# Patient Record
Sex: Female | Born: 1940 | Race: White | Hispanic: No | Marital: Married | Smoking: Never smoker
Health system: Southern US, Community
[De-identification: ages and names within clinical notes are randomized; demographics above are authoritative.]

## PROBLEM LIST (undated history)

## (undated) DIAGNOSIS — N200 Calculus of kidney: Secondary | ICD-10-CM

## (undated) DIAGNOSIS — R82994 Hypercalciuria: Secondary | ICD-10-CM

## (undated) DIAGNOSIS — Z87898 Personal history of other specified conditions: Secondary | ICD-10-CM

## (undated) DIAGNOSIS — K635 Polyp of colon: Secondary | ICD-10-CM

## (undated) DIAGNOSIS — I341 Nonrheumatic mitral (valve) prolapse: Secondary | ICD-10-CM

## (undated) DIAGNOSIS — S329XXA Fracture of unspecified parts of lumbosacral spine and pelvis, initial encounter for closed fracture: Secondary | ICD-10-CM

## (undated) DIAGNOSIS — Z9181 History of falling: Secondary | ICD-10-CM

## (undated) DIAGNOSIS — Z85828 Personal history of other malignant neoplasm of skin: Secondary | ICD-10-CM

## (undated) DIAGNOSIS — M1990 Unspecified osteoarthritis, unspecified site: Secondary | ICD-10-CM

## (undated) HISTORY — DX: Polyp of colon: K63.5

## (undated) HISTORY — PX: CATARACT EXTRACTION: SUR2

## (undated) HISTORY — DX: Nonrheumatic mitral (valve) prolapse: I34.1

## (undated) HISTORY — DX: Calculus of kidney: N20.0

## (undated) HISTORY — DX: Personal history of other specified conditions: Z87.898

## (undated) HISTORY — DX: History of falling: Z91.81

## (undated) HISTORY — DX: Unspecified osteoarthritis, unspecified site: M19.90

## (undated) HISTORY — DX: Personal history of other malignant neoplasm of skin: Z85.828

## (undated) HISTORY — DX: Fracture of unspecified parts of lumbosacral spine and pelvis, initial encounter for closed fracture: S32.9XXA

## (undated) HISTORY — DX: Hypercalciuria: R82.994

---

## 1965-03-09 DIAGNOSIS — N200 Calculus of kidney: Secondary | ICD-10-CM

## 1965-03-09 HISTORY — PX: KIDNEY STONE SURGERY: SHX686

## 1965-03-09 HISTORY — DX: Calculus of kidney: N20.0

## 1997-03-09 HISTORY — PX: CHOLECYSTECTOMY: SHX55

## 1997-08-28 ENCOUNTER — Other Ambulatory Visit: Admission: RE | Admit: 1997-08-28 | Discharge: 1997-08-28 | Payer: Self-pay | Admitting: Obstetrics & Gynecology

## 1998-09-04 ENCOUNTER — Other Ambulatory Visit: Admission: RE | Admit: 1998-09-04 | Discharge: 1998-09-04 | Payer: Self-pay | Admitting: Obstetrics & Gynecology

## 1999-06-27 ENCOUNTER — Other Ambulatory Visit: Admission: RE | Admit: 1999-06-27 | Discharge: 1999-06-27 | Payer: Self-pay | Admitting: Obstetrics & Gynecology

## 2000-02-05 ENCOUNTER — Encounter: Admission: RE | Admit: 2000-02-05 | Discharge: 2000-02-05 | Payer: Self-pay | Admitting: Internal Medicine

## 2000-02-05 ENCOUNTER — Encounter: Payer: Self-pay | Admitting: Internal Medicine

## 2000-03-19 ENCOUNTER — Encounter: Admission: RE | Admit: 2000-03-19 | Discharge: 2000-03-19 | Payer: Self-pay | Admitting: Internal Medicine

## 2000-03-19 ENCOUNTER — Encounter: Payer: Self-pay | Admitting: Internal Medicine

## 2000-08-12 ENCOUNTER — Other Ambulatory Visit: Admission: RE | Admit: 2000-08-12 | Discharge: 2000-08-12 | Payer: Self-pay | Admitting: Obstetrics and Gynecology

## 2001-08-29 ENCOUNTER — Other Ambulatory Visit: Admission: RE | Admit: 2001-08-29 | Discharge: 2001-08-29 | Payer: Self-pay | Admitting: Obstetrics and Gynecology

## 2002-09-08 ENCOUNTER — Other Ambulatory Visit: Admission: RE | Admit: 2002-09-08 | Discharge: 2002-09-08 | Payer: Self-pay | Admitting: Obstetrics and Gynecology

## 2003-02-08 ENCOUNTER — Inpatient Hospital Stay (HOSPITAL_COMMUNITY): Admission: EM | Admit: 2003-02-08 | Discharge: 2003-02-11 | Payer: Self-pay | Admitting: Emergency Medicine

## 2003-02-09 ENCOUNTER — Encounter: Payer: Self-pay | Admitting: Cardiology

## 2003-02-12 ENCOUNTER — Ambulatory Visit (HOSPITAL_COMMUNITY): Admission: RE | Admit: 2003-02-12 | Discharge: 2003-02-12 | Payer: Self-pay | Admitting: Internal Medicine

## 2003-08-20 ENCOUNTER — Other Ambulatory Visit: Admission: RE | Admit: 2003-08-20 | Discharge: 2003-08-20 | Payer: Self-pay | Admitting: Obstetrics and Gynecology

## 2004-09-23 ENCOUNTER — Other Ambulatory Visit: Admission: RE | Admit: 2004-09-23 | Discharge: 2004-09-23 | Payer: Self-pay | Admitting: Obstetrics and Gynecology

## 2005-01-09 ENCOUNTER — Ambulatory Visit: Payer: Self-pay | Admitting: Internal Medicine

## 2005-01-21 ENCOUNTER — Ambulatory Visit: Payer: Self-pay | Admitting: Internal Medicine

## 2005-01-21 ENCOUNTER — Encounter (INDEPENDENT_AMBULATORY_CARE_PROVIDER_SITE_OTHER): Payer: Self-pay | Admitting: Specialist

## 2005-09-08 IMAGING — CT CT CERVICAL SPINE W/O CM
3 of 5 series · 9 of 20 positions shown, 11 images · non-contrast
Comparison: none

CLINICAL DATA: Low back and right chest pain following a fall. 
 TWO VIEW CHEST
 The cardiac silhouette is mildly enlarged.  The interstitial markings throughout both lungs are minimally prominent. Mild to moderate scoliosis is noted.  No fracture or pneumothorax is seen.
 IMPRESSION
 1.  Mild cardiomegaly. 
 2.  Minimal chronic interstitial lung disease.
 COMPLETE LUMBAR SPINE
 Five views demonstrate five non-rib bearing lumbar vertebrae.  Mild to moderate levoconvex lumbar scoliosis is noted as well as moderate right lateral disc space narrowing at the L4-5 level, minimal anterior spur formation at that level and mild anterior spur formation at the L3-4 level.  Facet degenerative changes are noted in the lower lumbar spine.  No fractures or subluxations are seen.  Cholecystectomy clips are noted.
 Scoliosis and degenerative changes as described above.  No fracture or subluxation.

[Series 4: helical c-spine · axial · 0.27mm/px · z∈[-244,-189]mm · 2 of 66 slices shown (1 of 2)]
[im 22/66  bone]
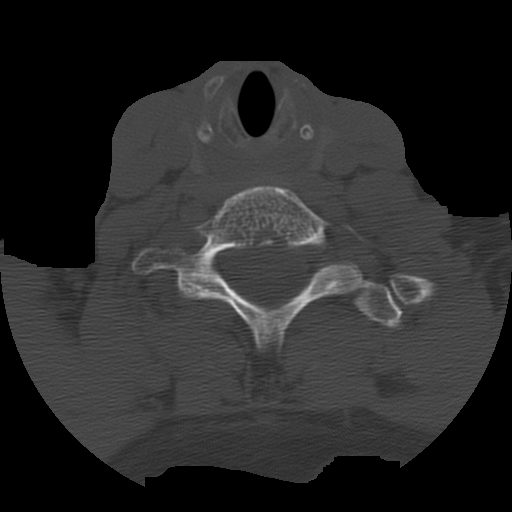
[im 44/66  bone]
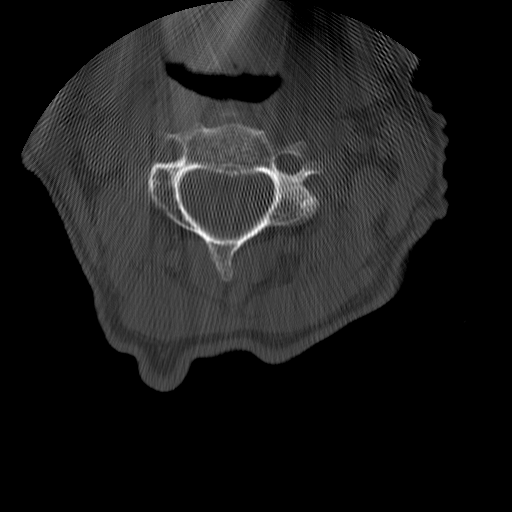

[Series 104: helical c-spine · axial · 0.27mm/px · z∈[-274,-157]mm · 6 of 131 slices shown, 8 images (2 of 2)]
[im 19/131  soft-tissue]
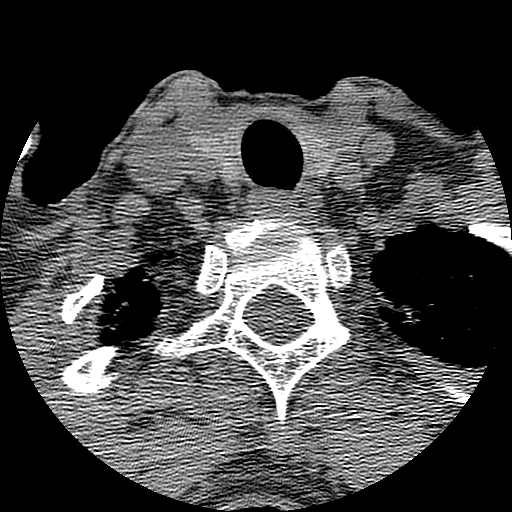
[im 19/131  bone]
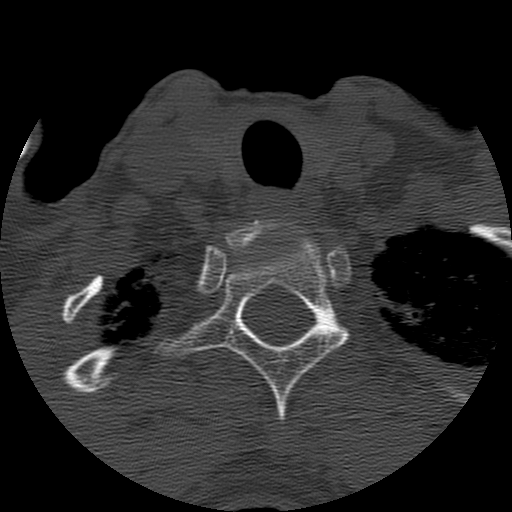
[im 38/131  bone]
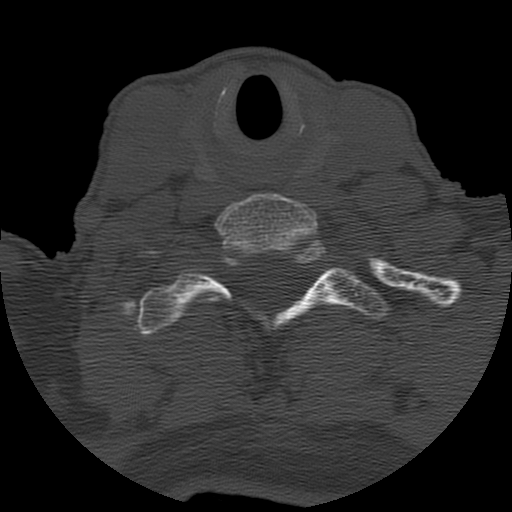
[im 56/131  bone]
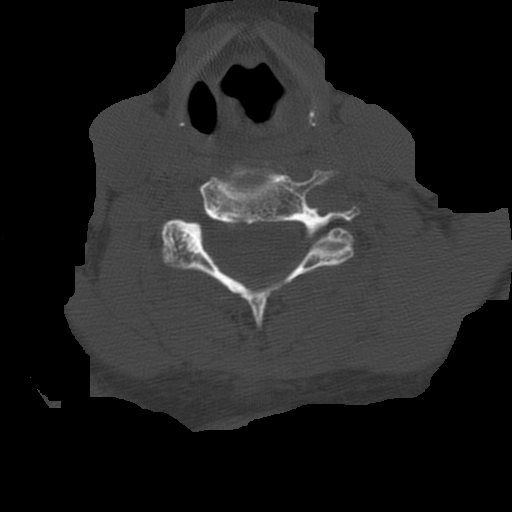
[im 75/131  bone]
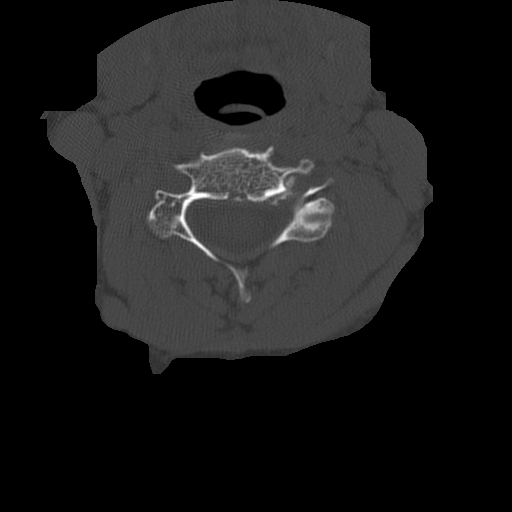
[im 93/131  soft-tissue]
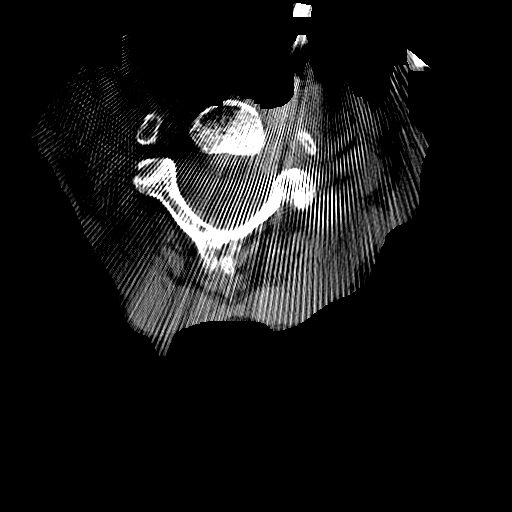
[im 93/131  bone]
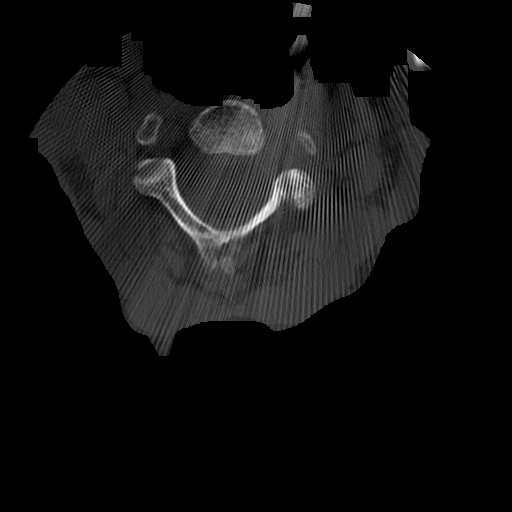
[im 112/131  bone]
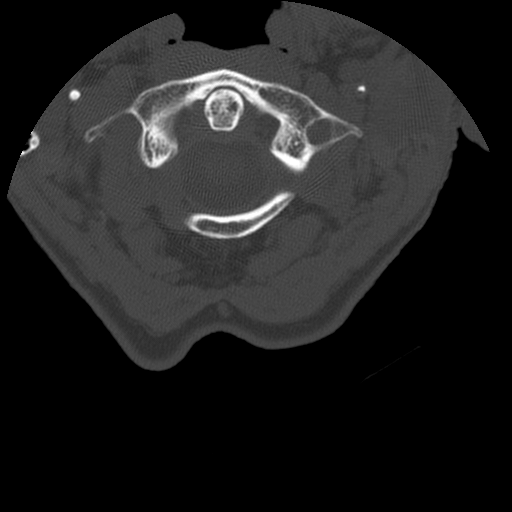

[Series 619: reformatted · coronal · 0.31mm/px · 1 of 40 slices shown]
[im 20/40  bone]
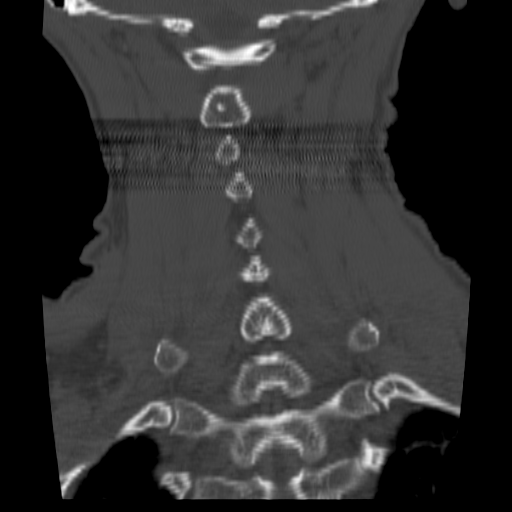

[9 of 20 positions shown; findings below may reference images not displayed]

## 2005-09-12 IMAGING — NM NM BONE 3 PHASE
4 series · 24 of 24 positions shown · non-contrast
Comparison: none

CLINICAL DATA: Syncope. Patient with a history of compression fracture. 
 THREE PHASE BONE SCAN  
 Following the IV administration of 25 mCi RRmMc MDP.  Regional imaging of the thoracic and lumbar regions performed. 
 In the region of the thoracic spine, there is no abnormal osseous tracer activity to suggest an acute compression fracture.  The patient?s recent radiographs of 02/09/03 do show mild wedging of T-8 which is remote.
 Activity posteriorly within the lumbar spine at the L4-5 and L5-S1 level is attributed to facet degenerative change.  There is a focus of activity within the left anterior 8th rib which I suspect reflects an occult fracture.
 IMPRESSION
 Negative for acute thoracic compression fracture. 
 Probable left T-8 rib fracture. 
 Degenerative changes lower lumbar spine.

[Series 1: bf bone flow-statics · 4.95mm/px · 6 of 24 frames shown (1 of 4)]
[frame 3/24]
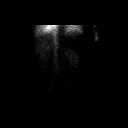
[frame 7/24]
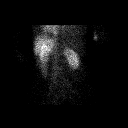
[frame 11/24]
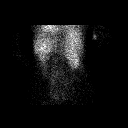
[frame 15/24]
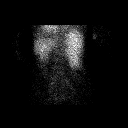
[frame 19/24]
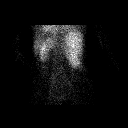
[frame 23/24]
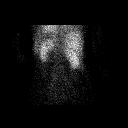

[Series 1: bf bone flow-statics · 5.00mm/px · 6 of 24 frames shown (2 of 4)]
[frame 3/24]
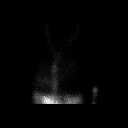
[frame 7/24]
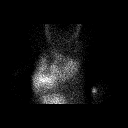
[frame 11/24]
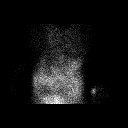
[frame 15/24]
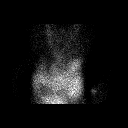
[frame 19/24]
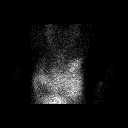
[frame 23/24]
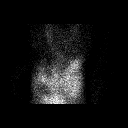

[Series 1: bf bone flow-statics · 4.95mm/px · 6 of 24 frames shown (3 of 4)]
[frame 3/24]
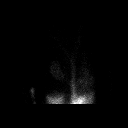
[frame 7/24]
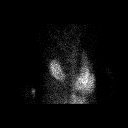
[frame 11/24]
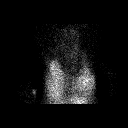
[frame 15/24]
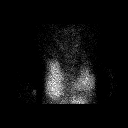
[frame 19/24]
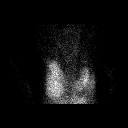
[frame 23/24]
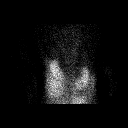

[Series 1: bf bone flow-statics · 5.00mm/px · 6 of 24 frames shown (4 of 4)]
[frame 3/24]
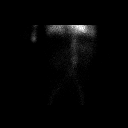
[frame 7/24]
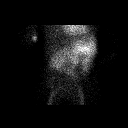
[frame 11/24]
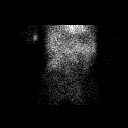
[frame 15/24]
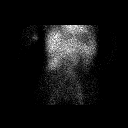
[frame 19/24]
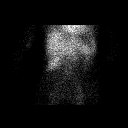
[frame 23/24]
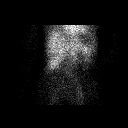

[24 of 24 positions shown; findings below may reference images not displayed]

## 2005-10-06 ENCOUNTER — Other Ambulatory Visit: Admission: RE | Admit: 2005-10-06 | Discharge: 2005-10-06 | Payer: Self-pay | Admitting: Obstetrics and Gynecology

## 2008-05-04 ENCOUNTER — Ambulatory Visit: Payer: Self-pay

## 2008-08-30 ENCOUNTER — Encounter: Admission: RE | Admit: 2008-08-30 | Discharge: 2008-10-04 | Payer: Self-pay | Admitting: Internal Medicine

## 2008-09-13 ENCOUNTER — Encounter: Admission: RE | Admit: 2008-09-13 | Discharge: 2008-09-13 | Payer: Self-pay | Admitting: Internal Medicine

## 2008-12-02 IMAGING — CT CT CHEST W/O CM
4 of 7 series · 17 of 36 positions shown, 19 images · non-contrast
Comparison: NONE

CLINICAL DATA: Cough, rib soreness. 

CT CHEST WITH INTRAVENOUS CONTRAST
TECHNIQUE: Multiple axial slices were obtained from the lung 
apex through the upper abdomen.  75 cc of Optiray 350 was injected 
at a rate of 3 cc per second.  Lung, soft tissue, and bone window 
settings were obtained.

[Series 2: with · axial · 0.68mm/px · z∈[+832,+1072]mm · 5 of 73 slices shown, 7 images]
[im 13/73  mediastinal]
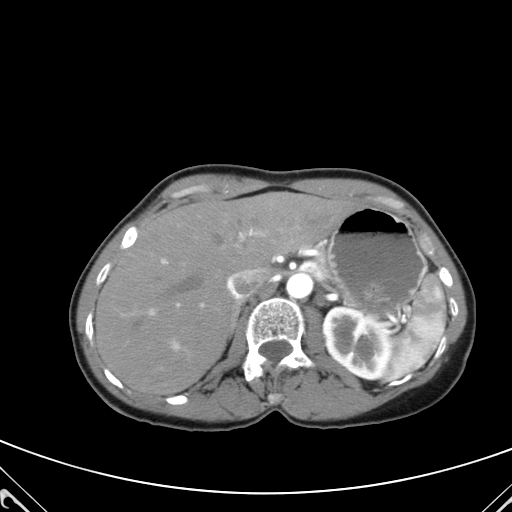
[im 13/73  lung]
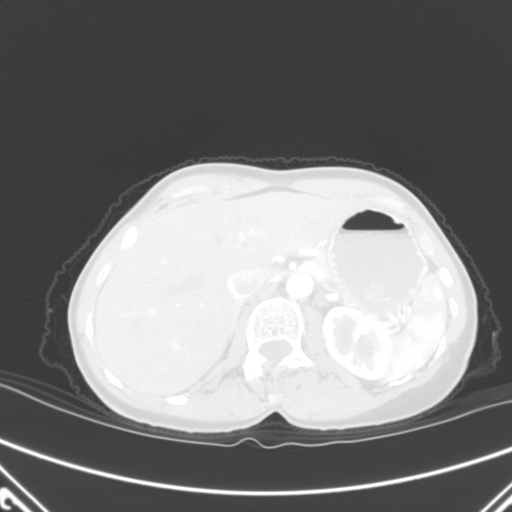
[im 25/73  lung]
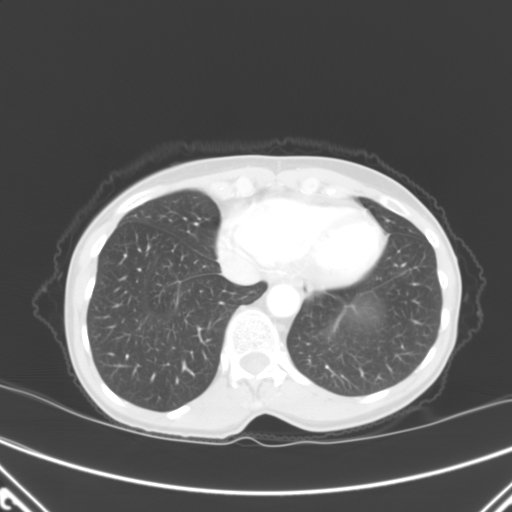
[im 37/73  lung]
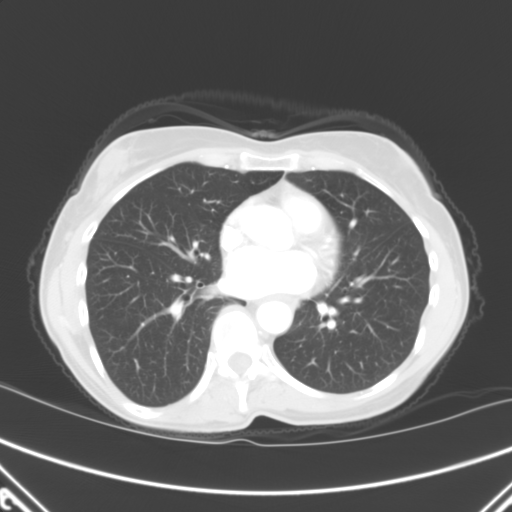
[im 49/73  lung]
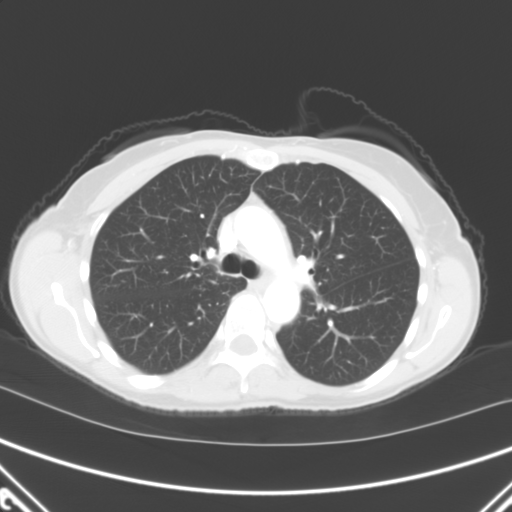
[im 61/73  mediastinal]
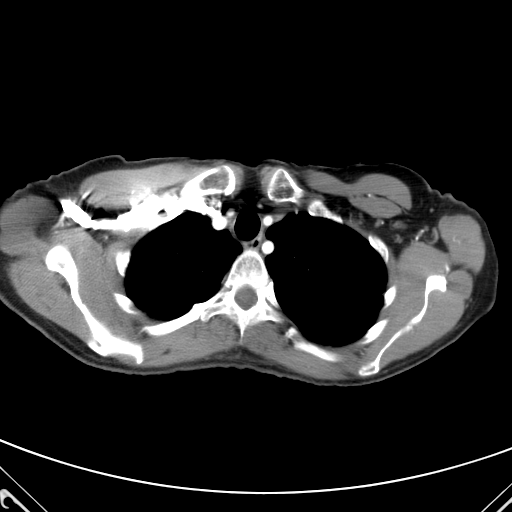
[im 61/73  lung]
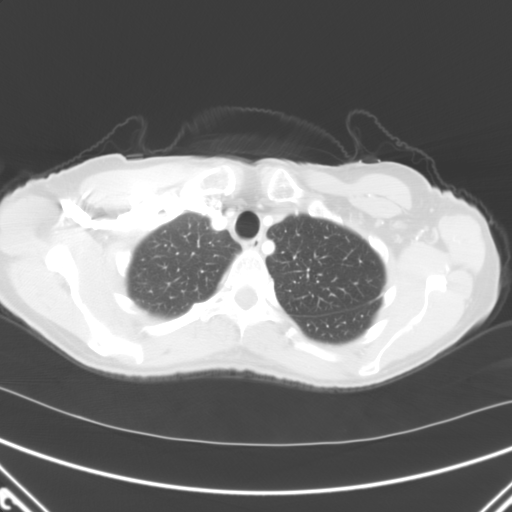

[Series 3: lung · axial · 0.68mm/px · z∈[+856,+1016]mm · 4 of 65 slices shown]
[im 11/65  lung]
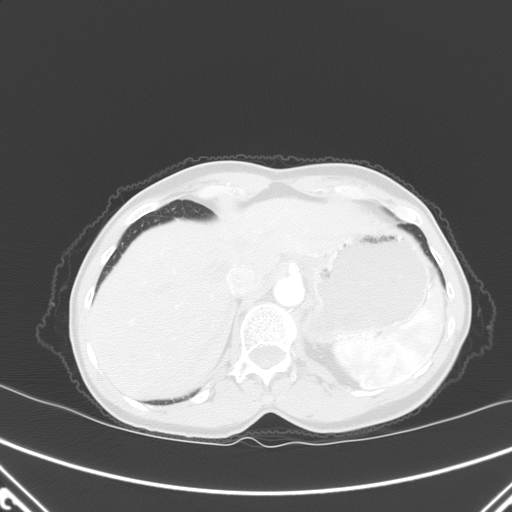
[im 22/65  lung]
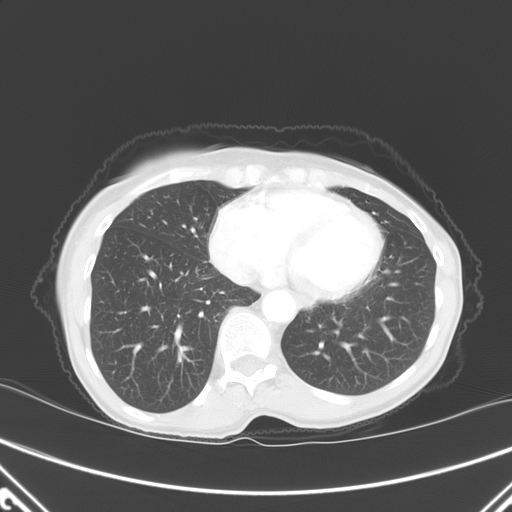
[im 33/65  lung]
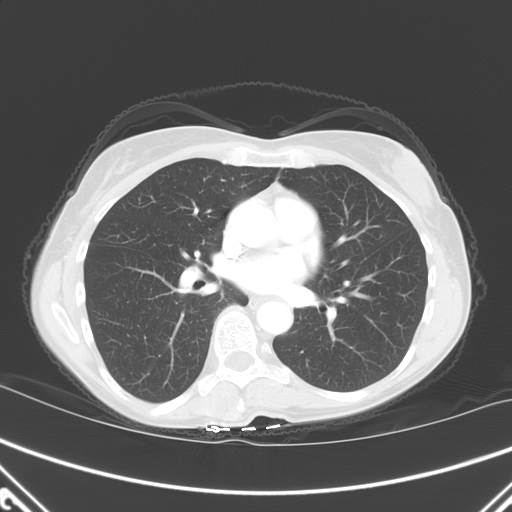
[im 43/65  lung]
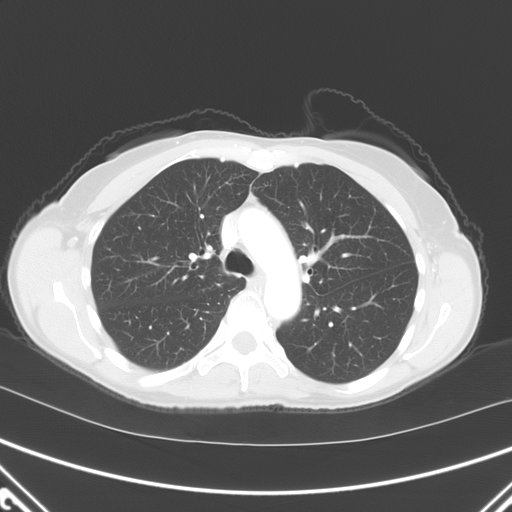

[Series 5: bone · axial · 0.68mm/px · z∈[+832,+1072]mm · 5 of 73 slices shown]
[im 13/73  lung]
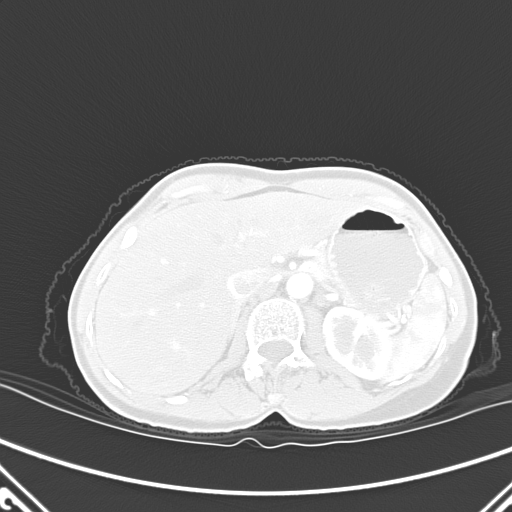
[im 25/73  lung]
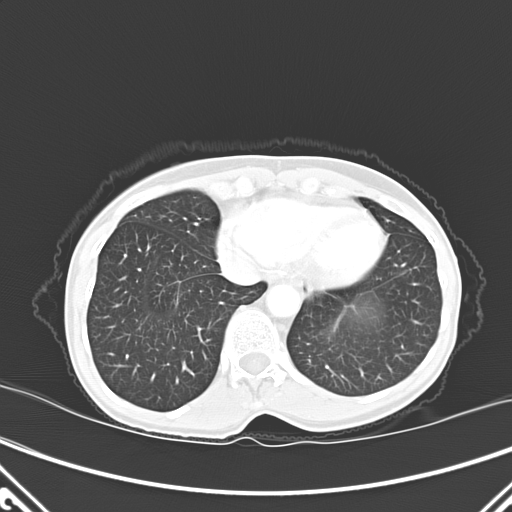
[im 37/73  lung]
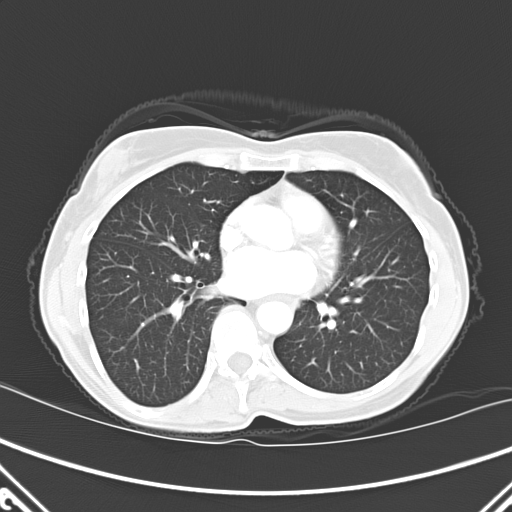
[im 49/73  lung]
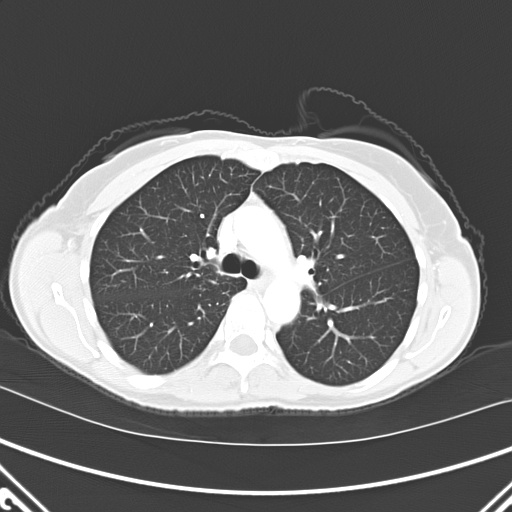
[im 61/73  lung]
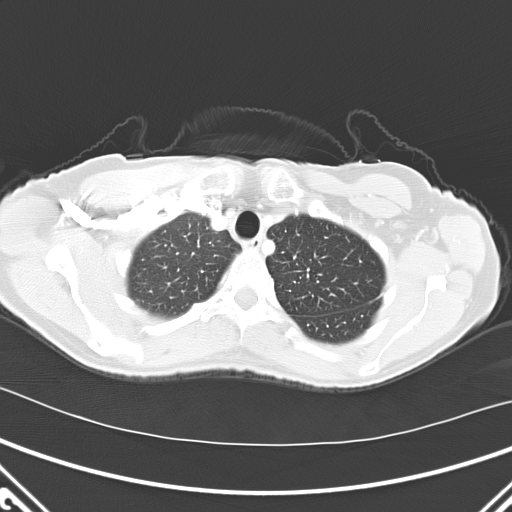

[coronals · coronal · 0.35mm/px · 3 of 33 slices shown]
[im 7/33  lung]
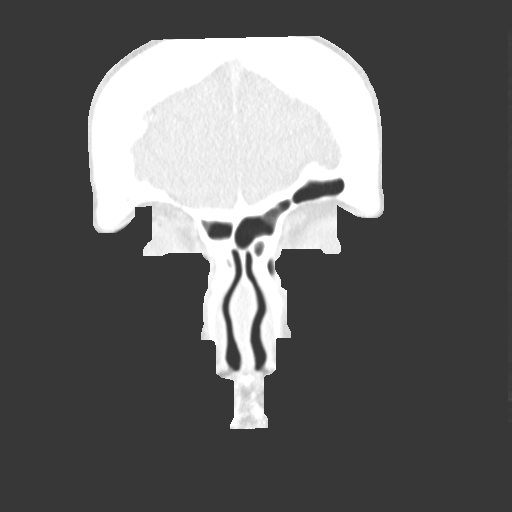
[im 13/33  lung]
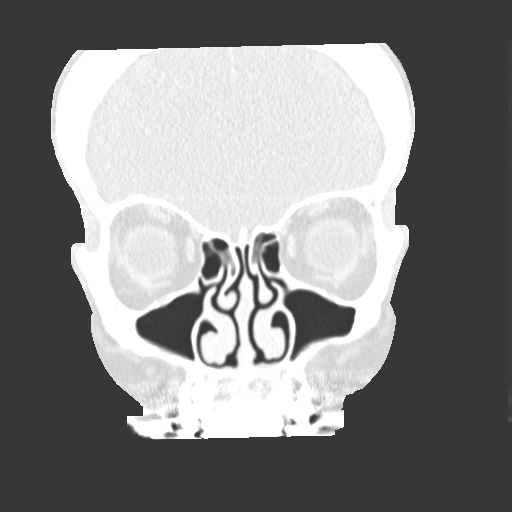
[im 20/33  lung]
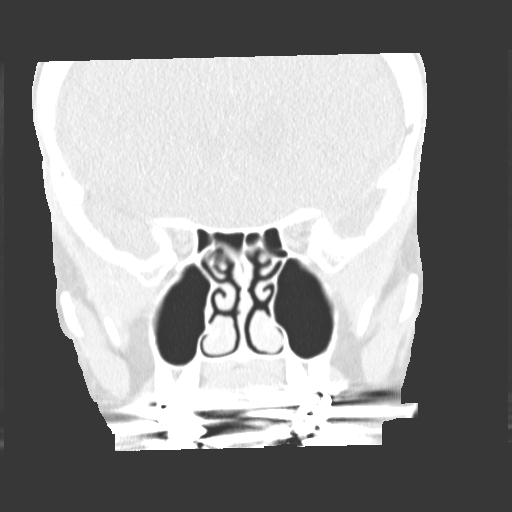

[17 of 36 positions shown; findings below may reference images not displayed]

FINDINGS: The heart size is upper limits of normal.  There is no 
evidence of mediastinal or hilar lymphadenopathy.  No pleural 
effusions or pneumothorax.  There are no pulmonary masses seen.  
No evidence of consolidation.  The adrenal glands are within 
normal limits.  There is diffuse osteopenia.  Patient has a mild 
dextroscoliosis of the thoracic spine.
IMPRESSION: Thoracic scoliosis. Osteopenia. Otherwise, chest CT 
electronically reviewed on 05/05/2006 Dict Date: 05/05/2006  Tran 
Date: 05/05/2006 DAS  JLM

## 2010-03-09 DIAGNOSIS — S329XXA Fracture of unspecified parts of lumbosacral spine and pelvis, initial encounter for closed fracture: Secondary | ICD-10-CM

## 2010-03-09 HISTORY — DX: Fracture of unspecified parts of lumbosacral spine and pelvis, initial encounter for closed fracture: S32.9XXA

## 2010-03-28 ENCOUNTER — Other Ambulatory Visit: Payer: Self-pay | Admitting: Dermatology

## 2010-06-23 ENCOUNTER — Ambulatory Visit (AMBULATORY_SURGERY_CENTER): Payer: Medicare Other | Admitting: *Deleted

## 2010-06-23 VITALS — Ht 64.0 in | Wt 115.6 lb

## 2010-06-23 DIAGNOSIS — Z860100 Personal history of colon polyps, unspecified: Secondary | ICD-10-CM

## 2010-06-23 MED ORDER — PEG-KCL-NACL-NASULF-NA ASC-C 100 G PO SOLR: 1.0000 | Freq: Once | ORAL | Status: AC

## 2010-07-07 ENCOUNTER — Encounter: Payer: Self-pay | Admitting: Internal Medicine

## 2010-07-07 ENCOUNTER — Ambulatory Visit (AMBULATORY_SURGERY_CENTER): Payer: Medicare Other | Admitting: Internal Medicine

## 2010-07-07 VITALS — BP 118/64 | HR 71 | Temp 97.4°F | Resp 16 | Ht 63.0 in | Wt 115.0 lb

## 2010-07-07 DIAGNOSIS — D126 Benign neoplasm of colon, unspecified: Secondary | ICD-10-CM

## 2010-07-07 DIAGNOSIS — Z1211 Encounter for screening for malignant neoplasm of colon: Secondary | ICD-10-CM

## 2010-07-07 DIAGNOSIS — Z86010 Personal history of colonic polyps: Secondary | ICD-10-CM

## 2010-07-07 DIAGNOSIS — K5730 Diverticulosis of large intestine without perforation or abscess without bleeding: Secondary | ICD-10-CM

## 2010-07-07 MED ORDER — SODIUM CHLORIDE 0.9 % IV SOLN: 500.0000 mL | INTRAVENOUS | Status: DC

## 2010-07-07 NOTE — Patient Instructions (Signed)
Follow discharge instructions.  Resume your medications.  Follow a High Fiber Diet,with liberal fluid intake.  Next Colonoscopy in 5 years.

## 2010-07-08 ENCOUNTER — Telehealth: Payer: Self-pay

## 2010-07-08 NOTE — Telephone Encounter (Signed)

## 2010-07-11 ENCOUNTER — Telehealth: Payer: Self-pay | Admitting: Internal Medicine

## 2010-07-11 NOTE — Telephone Encounter (Signed)
Explained to pt that there really should not be a reason for her neck to hurt. During colon the pt is placed on their side with a pillow under them. Suggested pt contact her primary care doctor if the discomfort persists. Pt verbalized understanding.

## 2010-07-11 NOTE — Telephone Encounter (Signed)
Returned call to number listed and received recording that the number was no longer a working number. Called the home number and left message for pt to call back.

## 2010-07-25 NOTE — Discharge Summary (Signed)
NAME:  Jacqueline Burgess, Jacqueline Burgess                       ACCOUNT NO.:  [id number]   MEDICAL RECORD NO.:  [id number]                   PATIENT TYPE:  INP   LOCATION:  3731                                 FACILITY:  [facility]   PHYSICIAN:  Hollice Espy, M.D.            DATE OF BIRTH:  November 22, 1940   DATE OF ADMISSION:  02/08/2003  DATE OF DISCHARGE:  02/11/2003                                 DISCHARGE SUMMARY   There is no dictation on this patient.                                                Hollice Espy, M.D.    SKK/MEDQ  D:  02/11/2003  T:  02/12/2003  Job:  [id number]

## 2010-07-25 NOTE — Discharge Summary (Signed)
NAME:  Jacqueline Burgess, Jacqueline Burgess                       ACCOUNT NO.:  [id number]   MEDICAL RECORD NO.:  [id number]                   PATIENT TYPE:  INP   LOCATION:  3731                                 FACILITY:  [facility]   PHYSICIAN:  Hollice Espy, M.D.            DATE OF BIRTH:  07/14/1940   DATE OF ADMISSION:  02/08/2003  DATE OF DISCHARGE:  02/11/2003                                 DISCHARGE SUMMARY   DISCHARGE DIAGNOSES:  1. Subacute T8 compression fracture.  2. Syncopal episode thought to be secondary to increased pain from #1.  3. Osteoporosis.  4. Gastroesophageal reflux disease.  5. Arthritis.  6. Anxiety.   DISCHARGE MEDICATIONS:  1. Celebrex 100 mg p.o. b.i.d.  2. Duragesic patch 25 mcg topically q.72h.  3. Hydrochlorothiazide 12.5 mg p.o. daily.   Continuation of all previous medications including:  1. Fosamax 70 every week.  2. Prempro 0.3/11.5 p.o. daily.  3. Her Aleve medication will be stopped.  4. Os-Cal D 500 mg p.o. t.i.d.  5. Clonazepam 0.25 mg p.o. q.h.s.  6. Amitriptyline 10 mg p.o. daily.   HISTORY OF PRESENT ILLNESS:  This is a 70 year old white female who  initially presented after having a few days of increased back pain, thought  to be secondary to recently stopping Vioxx as it discontinued, and not  feeling well who had a syncopal episode after she was walking to the  bathroom from her bed, and she had no recollection of any presyncopal  episode, was found by her husband immediately alert and oriented and brought  to the ED. CT of her head was negative. Cervical and lumbar films showed  notable multiple episodes of spinal arthritis but otherwise unremarkable.  The patient was on telemetry. She was not orthostatic. She was given IV  fluids. The patient was put on a Duragesic patch after she continued to have  severe episodes of pain and complaining of much pain in her lower back and  the thoracic area. Thoracic films were ordered. In addition, the  patient  responded well to the Duragesic patch. She had no evidence of any problems  while on telemetry, and her pain began to be well controlled once the  Duragesic patch came into effect. Carotid Doppler were initially ordered on  admission; however, due to scheduling delays, the patient was unable to get  this study done. The patient remained alert and oriented. She had no further  syncopal episode. She tolerated the patch well, and then her films came back  on February 10, 2003 noting that her thoracic film showed evidence of a T8  compression fracture. Given her history of severe increase in back pain a  few days prior, feel that this is a subacute fracture. Finally, given that  the carotid Dopplers would not be able to be done until February 12, 2003 as  well as getting a bone scan for followup, I discussed with the patient. The  patient currently states that her pain is much more well controlled. She is  ambulating well in the hallways. She denies any nausea. She denies any  syncopal episode. We both agree and felt comfortable saying that likely her  syncope was secondary to an acute compression fracture. I recommended that  she continue taking the Duragesic patch 25 mcg topically q.72h. I spoke with  orthopedic surgery, Dr. Rennis Chris, who after reviewing the patient's history  over the phone felt comfortable with seeing the patient in the outpatient  setting. He recommended that she follow up with [employer] [facility] and get  an extension brace, and this too could be done as an outpatient, allowing  for further mobility and better pain control. I spoke with the patient and  told her that the patient's 2-D echocardiogram which she had done further to  also assess on February 09, 2003 to assess her for syncope, it was completely  negative. She had no signs of any valvular disease, and she had a good  ejection fraction. I then spoke with patient further, explaining to her  about our  findings, and she told me she had actually been diagnosed with  osteoporosis few years ago, but on review of her medications, she is taking  as much medication as possible including Os-Cal D 500 t.i.d., Fosamax, and  is on hormone therapy. In addition, the patient told me that she had had a  workup done at [facility] a few years ago showing that she had a large  amount of calcium in her urine, and doctor had told her that she should be  on medicine for blood pressure which would lower this. This is likely a  thiazide diuretic, but she declined this at the time. 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 It is  noted that a calcium checked on her lab work during her hospital stay was  7.5. I feel that despite the number of medications she is on that perhaps  family genetics plus the combination of the hypercalciuria/hypocalcemia has  contributed to her osteoporosis and then therefore her compression fracture.  Therefore, I am starting a low dose hydrochlorothiazide 12.5 mg p.o. daily.  She can follow up with her PCP, Dr. Chilton Si, who can watch her potassium  levels and also ensure that she is tolerating this medicine well. In  addition, the patient was given Celebrex while she was here which she said  worked better than the Aleve, and tolerated this as well. I am writing a  prescription for this as well as her Duragesic patch. The patient is advised  to continue all of her previous medications. I did wonder about heavy  sedation from Duragesic and clonazepam which the latter she takes at night  and warned her about no driving until both of these medicines are tolerated  well for the next few days to a week. In addition, I told her to stop taking Aleve as she is going to be taking Celebrex. The  patient is otherwise  feeling better.   Disposition is quite improved. Her pain is well controlled. She is  discharged on a regular diet, and she is advised not to have any type of  heavy lifting and no exertional activity. I told her it was not idea to  return to work until she has been followed up by Dr. Chilton Si and by Dr. Rennis Chris  of the orthopedic doctor. She is advised to get carotid Dopplers and bone  scan, and prescriptions are written for this to get this done in the next  few days. Number for radiology scheduling has been given. In addition, the  patient will be given address for [employer] and the phone number  to get fitted for an extension brace, and prescription for this is given as  well. She will follow up with Dr. Chilton Si, PCP, in the next few days and Dr.  Rennis Chris of orthopedics later on this week following the brace. This patient  may indeed end up benefiting very well from a kyphoplasty. She is quite  ambulatory and leads an active healthy  lifestyle. This incident of fracture is unfortunate. I do feel comfortable  in saying that this is indeed the cause of her syncope as the rest of her  workup was negative, and I doubt Dopplers will give [place] much result. Even  with stenosis, TIA rarely ever presents as an episode of isolated syncope  with no neurological deficits.                                                Hollice Espy, M.D.    SKK/MEDQ  D:  02/11/2003  T:  02/12/2003  Job:  [id number]   cc:   Vania Rea. Rennis Chris, M.D.  [address]  [place]  [place] [address]  Fax: [phone]   Erskine Speed, M.D.  [address] [place]  [place]  [place] [address]  Fax: [phone]

## 2010-07-25 NOTE — H&P (Signed)
NAME:  Jacqueline Burgess, Jacqueline Burgess                       ACCOUNT NO.:  [id number]   MEDICAL RECORD NO.:  [id number]                   PATIENT TYPE:  INP   LOCATION:  3731                                 FACILITY:  [facility]   PHYSICIAN:  Hollice Espy, M.D.            DATE OF BIRTH:  [birth year]   DATE OF ADMISSION:  02/08/2003  DATE OF DISCHARGE:                                HISTORY & PHYSICAL   PRIMARY CARE PHYSICIAN:  Dr. Elmore Guise.   HISTORY OF PRESENT ILLNESS:  This is a 70 year old white female with past  medical history of GERD, osteoporosis, and anxiety who presents with a  syncopal episode. The patient states that she has severe arthritis and was  taking Vioxx, but recently the Vioxx was discontinued after FDA removed this  off the market. She has had an increased level of pain usually in her back,  especially in the last few days. She has had decreased activity and appetite  and has also complained of waking up with palpitations for the past few  days. On the morning of February 08, 2003, she woke up feeling nauseated and  walked to the bathroom. She had no vertigo or feeling of presyncope. The  next event she recalled was the sensation of falling, and she remembered  hitting her head. She was out for an unknown length of time, but she recalls  walking up and trying to call back into bed. She was found by husband in her  bed with a head laceration, and he also noted blood in the bathroom. He  tried to revive her. She awoke, and when he gave her water, she reportedly  passed out again. The patient reportedly had a previously episode 17 years  ago but none since. She was brought to the ED. She was complaining of left  back pain and right sided rib pain. Her CT of her C spine was negative. She  had no fractures, no spinal stenosis. The C4-5 film showed severe bilateral  insinuate and bilateral facet hypertrophy, right greater than left, and  __________ showed severe right foraminal  stenosis, moderate left stenosis.  Head CT was negative. Lumbar films showed DJD. The patient had no  neurological deficits. No orthostasis or head laceration was noted by the  emergency room attending physician. Currently, the patient states that she  is having a lot of pain in her back. She has a headache. She denies any  visual changes. She denies any chest pain or shortness of breath. She is  just tired and hurting all over. The patient also denies any hematuria,  dysuria, constipation, diarrhea, abdominal pain. She has some overall  weakness but denies any focal weakness.   PAST MEDICAL HISTORY:  1. Osteoporosis.  2. Arthritis.  3. GERD.   MEDICATIONS:  1. She is on Protonix 40.  2. Prempro 0.3/11.5 daily.  3. Clonazepam 0.5 mg one half tablet q.h.s.  4. Amitriptyline 10  mg p.o. q.h.s.  5. Fosamax every week.   ALLERGIES:  No known drug allergies although she says Sudafed gives her  palpitations.   SOCIAL HISTORY:  She denies any smoking or drinking.   FAMILY HISTORY:  Noncontributory.   PHYSICAL EXAMINATION:  VITAL SIGNS:  On admission were temperature 97.8,  heart rate 64, respirations 16, blood pressure 100/60, weight 120 pounds.  GENERAL:  She appears to be in moderate distress but otherwise alert and  oriented x3.  CARDIOVASCULAR:  Regular rate and rhythm, S1 and S2. She has no carotid  bruits.  HEENT:  She has a 2-cm head laceration which appears to be sewn and bandaged  over the right eye.  LUNGS:  Clear to auscultation bilaterally.  ABDOMEN:  Soft, nontender, nondistended, positive bowel sounds.  EXTREMITIES:  There is no clubbing, cyanosis, or edema although she has some  palpable tenderness over the right rib cage and left lower back, worsening  when I push heavily on it or when she moves excessively.   LABORATORY DATA:  Her PT is 13.6, INR 1.1. PH 7.43, pCO2 41.6, bicarb 28. UA  was negative. CT films and radiology films are as described in HPI. Troponin   I less 0.01. CPK 74, MB 1.1. Sodium 137, potassium 2.9, chloride 104, bicarb  22, glucose 115, creatinine 1.0. White count 10.7, H and H 13.2 and 39.8,  platelets 169.   ASSESSMENT/PLAN:  This is a 70 year old white female with a history of  arthritis who presents status post an episode of syncope. The cause may be  dehydration or from pain although the patient is definitely not orthostatic.  Reviewed films with neurosurgery but do not see any evidence of any spinal  stenosis. Question if this is an arrhythmia or may be vasovagal. Will check  carotid Dopplers, 2-D echocardiogram, IV fluids.  1. Status post fall.  2. Pain control. Her head laceration appears to be stable at this point.  3. Arthritis.                                                Hollice Espy, M.D.    SKK/MEDQ  D:  02/11/2003  T:  02/12/2003  Job:  [id number]   cc:   Erskine Speed, M.D.  [address], [address]  [place]  [place] [address]  Fax: [phone]

## 2010-10-15 ENCOUNTER — Ambulatory Visit
Admission: RE | Admit: 2010-10-15 | Discharge: 2010-10-15 | Disposition: A | Discharge status: Home / Self Care | Payer: Medicare Other | Source: Ambulatory Visit | Attending: Orthopedic Surgery | Admitting: Orthopedic Surgery

## 2010-10-15 ENCOUNTER — Other Ambulatory Visit: Payer: Self-pay | Admitting: Orthopedic Surgery

## 2010-10-15 DIAGNOSIS — T148XXA Other injury of unspecified body region, initial encounter: Secondary | ICD-10-CM

## 2010-11-08 HISTORY — PX: SHOULDER SURGERY: SHX246

## 2010-11-11 ENCOUNTER — Ambulatory Visit (HOSPITAL_COMMUNITY): Payer: Medicare Other

## 2010-11-11 ENCOUNTER — Inpatient Hospital Stay (HOSPITAL_COMMUNITY)
Admission: RE | Admit: 2010-11-11 | Discharge: 2010-11-13 | DRG: 494 | Disposition: A | Discharge status: Home Health | Payer: Medicare Other | Source: Ambulatory Visit | Attending: Orthopedic Surgery | Admitting: Orthopedic Surgery

## 2010-11-11 DIAGNOSIS — W19XXXA Unspecified fall, initial encounter: Secondary | ICD-10-CM | POA: Diagnosis present

## 2010-11-11 DIAGNOSIS — Z882 Allergy status to sulfonamides status: Secondary | ICD-10-CM

## 2010-11-11 DIAGNOSIS — Z888 Allergy status to other drugs, medicaments and biological substances status: Secondary | ICD-10-CM

## 2010-11-11 DIAGNOSIS — S42293A Other displaced fracture of upper end of unspecified humerus, initial encounter for closed fracture: Principal | ICD-10-CM | POA: Diagnosis present

## 2010-11-11 LAB — COMPREHENSIVE METABOLIC PANEL
AST: 29 U/L (ref 0–37)
BUN: 19 mg/dL (ref 6–23)
CO2: 27 mEq/L (ref 19–32)
Chloride: 106 mEq/L (ref 96–112)
Creatinine, Ser: 0.62 mg/dL (ref 0.50–1.10)
GFR calc Af Amer: >60 mL/min (ref >60)
GFR calc non Af Amer: >60 mL/min (ref >60)
Glucose, Bld: 94 mg/dL (ref 70–99)
Total Bilirubin: 0.4 mg/dL (ref 0.3–1.2)

## 2010-11-11 LAB — TYPE AND SCREEN
ABO/RH(D): O POS
Antibody Screen: NEGATIVE

## 2010-11-11 LAB — PROTIME-INR: INR: 0.98 (ref 0.00–1.49)

## 2010-11-11 LAB — CBC
HCT: 41 % (ref 36.0–46.0)
Hemoglobin: 13.9 g/dL (ref 12.0–15.0)
MCH: 31.1 pg (ref 26.0–34.0)
MCHC: 33.9 g/dL (ref 30.0–36.0)
MCV: 91.7 fL (ref 78.0–100.0)
RDW: 13.8 % (ref 11.5–15.5)

## 2010-11-11 LAB — SURGICAL PCR SCREEN: Staphylococcus aureus: NEGATIVE

## 2010-11-12 LAB — CBC
HCT: 26.5 % — ABNORMAL LOW (ref 36.0–46.0)
MCHC: 34 g/dL (ref 30.0–36.0)
MCV: 91.1 fL (ref 78.0–100.0)
RDW: 13.8 % (ref 11.5–15.5)

## 2010-11-12 LAB — BASIC METABOLIC PANEL
BUN: 20 mg/dL (ref 6–23)
Calcium: 7.8 mg/dL — ABNORMAL LOW (ref 8.4–10.5)
Chloride: 104 mEq/L (ref 96–112)
Creatinine, Ser: 0.49 mg/dL — ABNORMAL LOW (ref 0.50–1.10)
GFR calc Af Amer: >60 mL/min (ref >60)

## 2010-11-13 ENCOUNTER — Inpatient Hospital Stay (HOSPITAL_COMMUNITY): Payer: Medicare Other

## 2010-11-13 LAB — CBC
MCH: 31.6 pg (ref 26.0–34.0)
MCHC: 34.9 g/dL (ref 30.0–36.0)
Platelets: 118 10*3/uL — ABNORMAL LOW (ref 150–400)
RBC: 2.66 MIL/uL — ABNORMAL LOW (ref 3.87–5.11)

## 2010-11-13 LAB — BASIC METABOLIC PANEL
Calcium: 8.2 mg/dL — ABNORMAL LOW (ref 8.4–10.5)
Sodium: 137 mEq/L (ref 135–145)

## 2010-11-14 NOTE — Discharge Summary (Signed)
  [name], [name] NO.:  [id number]  MEDICAL RECORD NO.:  [id number]  LOCATION:  5001                         FACILITY:  [facility]  PHYSICIAN:  Eulas Post, MD    DATE OF BIRTH:  1940/03/20  DATE OF ADMISSION:  11/11/2010 DATE OF DISCHARGE:  11/13/2010                              DISCHARGE SUMMARY   ATTENDING SURGEON:  Eulas Post, MD  ADMISSION DIAGNOSIS:  Left proximal humerus fracture.  DISCHARGE DIAGNOSIS:  Left proximal humerus fracture.  PRIMARY PROCEDURE:  Left proximal humerus open reduction and internal fixation.  HOSPITAL COURSE:  Jacqueline Burgess is a 70 year old who broke her left shoulder.  Initially, we tried to treat this nonoperatively, but this subsequently failed.  She elected for surgical management because of the significant displacement.  She tolerated the procedure well and postoperatively did not have any complications.  She was given perioperative antibiotics for antimicrobial prophylaxis.  She was given sequential compression devices and early ambulation for DVT prophylaxis. She was on p.o. analgesics and had satisfactory pain control.  She was discharged home with followup with me in 2 weeks.  There were no complications, and she benefited maximally from her hospital stay.  Her sensation was intact throughout her, arm and she was neurovascularly intact distally.     Eulas Post, MD     JPL/MEDQ  D:  11/13/2010  T:  11/13/2010  Job:  [id number]  Electronically Signed by Teryl Lucy MD on 11/14/2010 04:33:01 PM

## 2010-11-14 NOTE — Op Note (Signed)
Jacqueline Burgess, Jacqueline Burgess NO.:  [id number]  MEDICAL RECORD NO.:  [id number]  LOCATION:  5001                         FACILITY:  [facility]  PHYSICIAN:  Eulas Post, MD    DATE OF BIRTH:  [birth year]  DATE OF PROCEDURE:  11/11/2010 DATE OF DISCHARGE:                              OPERATIVE REPORT   ATTENDING SURGEON:  Eulas Post, MD  FIRST ASSISTANT:  Janace Litten, orthopedic PA-C  PREOPERATIVE DIAGNOSIS:  Left proximal humerus fracture.  POSTOPERATIVE DIAGNOSIS:  Left proximal humerus fracture.  OPERATIVE PROCEDURE:  Open reduction and internal fixation of left proximal humerus fracture.  ANESTHESIA:  General with regional block.  ESTIMATED BLOOD LOSS:  100 mL.  OPERATIVE IMPLANTS:  Biomet S3 proximal humerus locking plate.  PREOPERATIVE INDICATIONS:  Mrs. Jacqueline Burgess is a 70 year old woman who had a left proximal humerus fracture.  Initially, we attempted to treat this nonoperatively, but she subsequently displaced and the shaft came out nearly completely from underneath the humeral head, and she also went into a varus position.  Therefore, she elected for surgical management.  The risks, benefits, and alternatives were discussed before the procedure including but not limited to risks of infection, bleeding, nerve injury, malunion, nonunion, hardware prominence, hardware failure, need for hardware removal, stiffness, loss of function, need for conversion to hemiarthroplasty, cardiopulmonary complications, among others.  She was willing to proceed.  OPERATIVE PROCEDURE:  The patient was brought to the operating room and placed in supine position.  IV antibiotics were given.  Regional block had already been given.  General anesthesia was administered.  She was placed in the beach-chair position.  Foley was also placed.  The left upper extremity was prepped and draped in the usual sterile fashion. Time-out was performed.  Deltopectoral interval  was carried out. Cephalic vein was retracted laterally.  The fracture site was identified.  It had already fairly healed, which made this much more difficult.  I took the fracture callus down, and the shaft of the anterior, and mobilize things posteriorly and used the C-arm to guide my circumferential release in order to gain mobility of the head.  Once I had the head mobile, I also placed a suture in the subscapularis and in the supraspinatus in order to help control the head segment.  I then replaced the head onto the shaft in the appropriate position and I applied a plate.  This was secured initially with a screw in the sliding hole and then I confirmed my reduction and held the head in place with 2 K-wires through the plate.  I had restored anatomic alignment both on AP and lateral views and restored the valgus inclination of the head.  I then secured the plate anteroinferiorly first with a smooth peg, and then one posteriorly and then completed the fixation through the proximal holes using smooth pegs in a locking configuration.  I then placed a second screw in the shaft. These had excellent fixation.  I did not feel that I needed a third screw.  Her bone quality was poor up in the head but was reasonable down in the shaft.  My 2 inferior screws in the head were little bit short,  and therefore I replaced these to be longer in order to make sure that I got some fixation in the head through the distal screws.  This was fairly challenging overall getting the correct length because the head fragment itself was somewhat thin, but nonetheless I did have satisfactory purchase and the entire head moved as a unit with the shaft.  I then irrigated the wounds copiously and repaired the deltopectoral interval with Vicryl and Vicryl for subcutaneous tissue with 4-0 Monocryl with Steri-Strips and sterile gauze for the skin. Janace Litten, orthopedic PA-C, was present and scrubbed throughout  the case and critical for completion with assistance with exposure as well as instrumentation and closure.  There were no complications and she tolerated the procedure well.  She will be in a sling for 6 weeks.     Eulas Post, MD     JPL/MEDQ  D:  11/11/2010  T:  11/12/2010  Job:  [id number]  Electronically Signed by Teryl Lucy MD on 11/14/2010 04:33:00 PM

## 2011-01-01 ENCOUNTER — Other Ambulatory Visit: Payer: Self-pay | Admitting: Dermatology

## 2011-03-16 DIAGNOSIS — Z4789 Encounter for other orthopedic aftercare: Secondary | ICD-10-CM | POA: Diagnosis not present

## 2011-03-16 DIAGNOSIS — M25519 Pain in unspecified shoulder: Secondary | ICD-10-CM | POA: Diagnosis not present

## 2011-03-16 DIAGNOSIS — S42213A Unspecified displaced fracture of surgical neck of unspecified humerus, initial encounter for closed fracture: Secondary | ICD-10-CM | POA: Diagnosis not present

## 2011-03-19 DIAGNOSIS — M25519 Pain in unspecified shoulder: Secondary | ICD-10-CM | POA: Diagnosis not present

## 2011-03-19 DIAGNOSIS — S42213A Unspecified displaced fracture of surgical neck of unspecified humerus, initial encounter for closed fracture: Secondary | ICD-10-CM | POA: Diagnosis not present

## 2011-03-25 DIAGNOSIS — S42213A Unspecified displaced fracture of surgical neck of unspecified humerus, initial encounter for closed fracture: Secondary | ICD-10-CM | POA: Diagnosis not present

## 2011-03-31 DIAGNOSIS — M25519 Pain in unspecified shoulder: Secondary | ICD-10-CM | POA: Diagnosis not present

## 2011-03-31 DIAGNOSIS — S42213A Unspecified displaced fracture of surgical neck of unspecified humerus, initial encounter for closed fracture: Secondary | ICD-10-CM | POA: Diagnosis not present

## 2011-04-02 DIAGNOSIS — S42213A Unspecified displaced fracture of surgical neck of unspecified humerus, initial encounter for closed fracture: Secondary | ICD-10-CM | POA: Diagnosis not present

## 2011-04-02 DIAGNOSIS — M25519 Pain in unspecified shoulder: Secondary | ICD-10-CM | POA: Diagnosis not present

## 2011-04-22 ENCOUNTER — Other Ambulatory Visit: Payer: Self-pay | Admitting: Dermatology

## 2011-04-22 DIAGNOSIS — C44621 Squamous cell carcinoma of skin of unspecified upper limb, including shoulder: Secondary | ICD-10-CM | POA: Diagnosis not present

## 2011-04-22 DIAGNOSIS — C44529 Squamous cell carcinoma of skin of other part of trunk: Secondary | ICD-10-CM | POA: Diagnosis not present

## 2011-04-22 DIAGNOSIS — L821 Other seborrheic keratosis: Secondary | ICD-10-CM | POA: Diagnosis not present

## 2011-05-06 DIAGNOSIS — M25519 Pain in unspecified shoulder: Secondary | ICD-10-CM | POA: Diagnosis not present

## 2011-06-22 DIAGNOSIS — D126 Benign neoplasm of colon, unspecified: Secondary | ICD-10-CM | POA: Diagnosis not present

## 2011-06-22 DIAGNOSIS — Z0000 Encounter for general adult medical examination without abnormal findings: Secondary | ICD-10-CM | POA: Diagnosis not present

## 2011-06-22 DIAGNOSIS — M810 Age-related osteoporosis without current pathological fracture: Secondary | ICD-10-CM | POA: Diagnosis not present

## 2011-06-22 DIAGNOSIS — Z79899 Other long term (current) drug therapy: Secondary | ICD-10-CM | POA: Diagnosis not present

## 2011-06-26 DIAGNOSIS — Z1231 Encounter for screening mammogram for malignant neoplasm of breast: Secondary | ICD-10-CM | POA: Diagnosis not present

## 2011-06-26 LAB — HM MAMMOGRAPHY

## 2011-08-26 ENCOUNTER — Other Ambulatory Visit: Payer: Self-pay | Admitting: Dermatology

## 2011-08-26 DIAGNOSIS — D0460 Carcinoma in situ of skin of unspecified upper limb, including shoulder: Secondary | ICD-10-CM | POA: Diagnosis not present

## 2011-08-26 DIAGNOSIS — Z85828 Personal history of other malignant neoplasm of skin: Secondary | ICD-10-CM | POA: Diagnosis not present

## 2011-08-26 DIAGNOSIS — C44621 Squamous cell carcinoma of skin of unspecified upper limb, including shoulder: Secondary | ICD-10-CM | POA: Diagnosis not present

## 2011-09-11 DIAGNOSIS — C44621 Squamous cell carcinoma of skin of unspecified upper limb, including shoulder: Secondary | ICD-10-CM | POA: Diagnosis not present

## 2011-10-06 DIAGNOSIS — N952 Postmenopausal atrophic vaginitis: Secondary | ICD-10-CM | POA: Diagnosis not present

## 2011-10-21 DIAGNOSIS — Z961 Presence of intraocular lens: Secondary | ICD-10-CM | POA: Diagnosis not present

## 2011-10-21 DIAGNOSIS — H10409 Unspecified chronic conjunctivitis, unspecified eye: Secondary | ICD-10-CM | POA: Diagnosis not present

## 2011-10-21 DIAGNOSIS — H2510 Age-related nuclear cataract, unspecified eye: Secondary | ICD-10-CM | POA: Diagnosis not present

## 2011-10-21 DIAGNOSIS — H04129 Dry eye syndrome of unspecified lacrimal gland: Secondary | ICD-10-CM | POA: Diagnosis not present

## 2011-11-05 DIAGNOSIS — M48061 Spinal stenosis, lumbar region without neurogenic claudication: Secondary | ICD-10-CM | POA: Diagnosis not present

## 2011-11-05 DIAGNOSIS — M79609 Pain in unspecified limb: Secondary | ICD-10-CM | POA: Diagnosis not present

## 2011-11-30 DIAGNOSIS — H2510 Age-related nuclear cataract, unspecified eye: Secondary | ICD-10-CM | POA: Diagnosis not present

## 2011-12-07 DIAGNOSIS — H2510 Age-related nuclear cataract, unspecified eye: Secondary | ICD-10-CM | POA: Diagnosis not present

## 2011-12-07 DIAGNOSIS — H25049 Posterior subcapsular polar age-related cataract, unspecified eye: Secondary | ICD-10-CM | POA: Diagnosis not present

## 2011-12-17 DIAGNOSIS — Z23 Encounter for immunization: Secondary | ICD-10-CM | POA: Diagnosis not present

## 2012-01-13 DIAGNOSIS — L738 Other specified follicular disorders: Secondary | ICD-10-CM | POA: Diagnosis not present

## 2012-01-13 DIAGNOSIS — L821 Other seborrheic keratosis: Secondary | ICD-10-CM | POA: Diagnosis not present

## 2012-01-13 DIAGNOSIS — L723 Sebaceous cyst: Secondary | ICD-10-CM | POA: Diagnosis not present

## 2012-01-13 DIAGNOSIS — Z85828 Personal history of other malignant neoplasm of skin: Secondary | ICD-10-CM | POA: Diagnosis not present

## 2012-01-13 DIAGNOSIS — L570 Actinic keratosis: Secondary | ICD-10-CM | POA: Diagnosis not present

## 2012-01-21 ENCOUNTER — Encounter: Payer: Self-pay | Admitting: Internal Medicine

## 2012-02-03 ENCOUNTER — Ambulatory Visit (INDEPENDENT_AMBULATORY_CARE_PROVIDER_SITE_OTHER): Payer: Medicare Other | Admitting: Internal Medicine

## 2012-02-03 ENCOUNTER — Encounter: Payer: Self-pay | Admitting: Internal Medicine

## 2012-02-03 VITALS — BP 106/66 | HR 97 | Temp 98.0°F | Ht 63.75 in | Wt 117.0 lb

## 2012-02-03 DIAGNOSIS — M1990 Unspecified osteoarthritis, unspecified site: Secondary | ICD-10-CM

## 2012-02-03 DIAGNOSIS — Z85828 Personal history of other malignant neoplasm of skin: Secondary | ICD-10-CM

## 2012-02-03 DIAGNOSIS — N200 Calculus of kidney: Secondary | ICD-10-CM | POA: Insufficient documentation

## 2012-02-03 DIAGNOSIS — D126 Benign neoplasm of colon, unspecified: Secondary | ICD-10-CM | POA: Diagnosis not present

## 2012-02-03 DIAGNOSIS — M129 Arthropathy, unspecified: Secondary | ICD-10-CM | POA: Diagnosis not present

## 2012-02-03 DIAGNOSIS — G4700 Insomnia, unspecified: Secondary | ICD-10-CM

## 2012-02-03 DIAGNOSIS — K635 Polyp of colon: Secondary | ICD-10-CM | POA: Insufficient documentation

## 2012-02-03 DIAGNOSIS — M810 Age-related osteoporosis without current pathological fracture: Secondary | ICD-10-CM

## 2012-02-03 DIAGNOSIS — Z9181 History of falling: Secondary | ICD-10-CM

## 2012-02-03 DIAGNOSIS — R109 Unspecified abdominal pain: Secondary | ICD-10-CM

## 2012-02-03 DIAGNOSIS — Z87442 Personal history of urinary calculi: Secondary | ICD-10-CM

## 2012-02-03 NOTE — Patient Instructions (Signed)
Trial of  prilosec 20 mg   For 2 weeks  And then as needed  and limit ibuprofen   Can use Tylenol  For now for pain   .  If Gi issues continue then we can do more evaluation.  Weight bearing exercise is good for osteoporosis . And adequate calcium and vitamin D.  Get last 2 years of records and any immmunization data  From dr Chilton Si.   Get a lab appt   Before   Next visit in about a month.

## 2012-02-03 NOTE — Progress Notes (Signed)
Chief Complaint  Patient presents with  . Establish Care    HPI: Patient comes in today for new patient visit. Her previous care has been through Dr.Ed  Chilton Si  but she is changing providers because of various reasons and the fact that her daughter and family are in our practice. Her ongoing problems include sleep difficulties for which she is taking amitriptyline 10 mg and clonazepam 0.5 mg a half for sleep at night for a number of years. She's had osteoporotic fracture when she had an undecided fall a year ago in [place] when getting up from the table after having some coffee and broke her pelvis. And her shoulder She has a history of taking Fosamax for a couple years the remote past but stopped it because of concerns about side effects none known where her last DEXA scan was done. She tries to exercise do weightbearing calcium vitamin D. She also has a history of skin cancers sees Dr. Venancio Poisson has had a basal cell and squamous cell. She has a remote history of kidney stones 1967 and colonic polyps. She takes occasional methocarbamol half of a 500 mg at night if she is having a hard time sleeping because of musculoskeletal pain.Methocarbamol   Used after fracture  And  Uses prn   Muscle spasn taking 1/2  About 3 x per week Left knee hurts lateral when kneeing at church and no other time   For a month . No swelling dysfunction. Exercises:  Walking  Has had a funny feeling in her stomach over the last few months. Cut down on her ibuprofen and only taking it in the morning I  Decreased from  2 because having growling feeling. For about 2 months. Discomfort is Worse on empty stomach.  Last colon dr Marina Goodell.  Up-to-date on colonoscopy no history of ulcer or bleeding vomiting change in bowel habits. ROS: See pertinent positives and negatives per HPI. 12 system reviewNegative currently for chest pain shortness of breath major changes in vision and hearing palpitations racing heart syncope although she cannot  explain why she fell a year ago remote history of another fall years ago. Uses premarin vaginal cream   Past Medical History  Diagnosis Date  . Arthritis   . Cataract   . Osteoporosis   . Kidney stones 1967  . Colon polyps   . Closed pelvic fracture 2012    pelvis and shoulder   . Hx of fall     fracture pelvis and left shoulder humerus  . Hx of nonmelanoma skin cancer     Family History  Problem Relation Age of Onset  . CVA Mother     Died in her 28s complications rheumatic  fever afterwards when she got a stroke  . Lung cancer Sister     Died in 20s was a smoker  . Rheumatic fever Mother   . Thyroid cancer Daughter     Papillary    History   Social History  . Marital Status: Married    Spouse Name: N/A    Number of Children: N/A  . Years of Education: N/A   Social History Main Topics  . Smoking status: Never Smoker   . Smokeless tobacco: None  . Alcohol Use: 1.5 oz/week    3 drink(s) per week  . Drug Use: No  . Sexually Active: None   Other Topics Concern  . 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 AMITRIPTYLINE HCL PO Take 10 mg by mouth Nightly.       . Calcium Carbonate-Vitamin D (CALCIUM-VITAMIN D) 500-200 MG-UNIT per tablet Take 1 tablet by mouth 2 (two) times daily with a meal.        . CLONAZEPAM PO Take 0.5 mg by mouth Nightly.       . cycloSPORINE (RESTASIS) 0.05 % ophthalmic emulsion Place 1 drop into both eyes 2 (two) times daily.      . fish oil-omega-3 fatty acids 1000 MG capsule Take by mouth daily.        Marland Kitchen glucosamine-chondroitin 500-400 MG tablet Take 1 tablet by mouth daily.        . methocarbamol (ROBAXIN) 500 MG tablet Take 500 mg by mouth. Using  about 3 times weekly.      . Multiple Vitamins-Minerals (MULTIVITAL) tablet Take 1 tablet by mouth daily.        Marland Kitchen VITAMIN D, ERGOCALCIFEROL, PO Take by mouth.        . [DISCONTINUED] Meloxicam (MOBIC PO) Take by mouth daily.         Facility-Administered Encounter Medications as of 02/03/2012  Medication Dose Route Frequency Provider Last Rate Last Dose  . 0.9 %  sodium chloride infusion  500 mL Intravenous Continuous Hilarie Fredrickson, MD        EXAM:  BP 106/66  Pulse 97  Temp 98 F (36.7 C) (Oral)  Ht 5' 3.75" (1.619 m)  Wt 117 lb (53.071 kg)  BMI 20.24 kg/m2  SpO2 98%  Body mass index is 20.24 kg/(m^2).  GENERAL: vitals reviewed and listed above, alert, oriented, appears well hydrated and in no acute distress  HEENT: [place] atraumatic, conjunctiva  Clear, eoms nl  no obvious abnormalities on inspection of external nose and ears  tms intact OP : no lesion edema or exudate    NECK: no obvious masses on inspection palpation  No bruit   Supple   LUNGS: clear to auscultation bilaterally, no wheezes, rales or rhonchi, good air movement  CV: HRRR, no clubbing cyanosis or  peripheral edema nl cap refill  Abdomen:  Sof,t normal bowel sounds without hepatosplenomegaly, no guarding rebound or masses no CVA tenderness MS: moves all extremities without noticeable focal  Abnormality left knee no swelling good rom Skin: sun changes no bruising or bleeding  PSYCH: pleasant and cooperative, no obvious depression or anxiety Neuro oriented x 3 cn seems intact 3-12 nl gait  Appears non focal   No motor changes dtrs intact  Oriented x 3 and no noted deficits in memory, attention, and speech.  ASSESSMENT AND PLAN:  Discussed the following assessment and plan:  1. Abdominal discomfort  cycloSPORINE (RESTASIS) 0.05 % ophthalmic emulsion, methocarbamol (ROBAXIN) 500 MG tablet, Basic metabolic panel, CBC with Differential, Hepatic function panel, Lipid panel, TSH, Vitamin D 25 hydroxy, PTH, intact and  calcium   epigastric pre prandial concern could be frim her nsaid ; dec and trial prilosec then fu  labs etc   2. Arthritis  cycloSPORINE (RESTASIS) 0.05 % ophthalmic emulsion, methocarbamol (ROBAXIN) 500 MG tablet, Basic metabolic panel, CBC with Differential, Hepatic function panel, Lipid panel, TSH, Vitamin D 25 hydroxy, PTH, intact and calcium  3. Colon polyps  cycloSPORINE (RESTASIS)  0.05 % ophthalmic emulsion, methocarbamol (ROBAXIN) 500 MG tablet, Basic metabolic panel, CBC with Differential, Hepatic function panel, Lipid panel, TSH, Vitamin D 25 hydroxy, PTH, intact and calcium  4. Insomnia  cycloSPORINE (RESTASIS) 0.05 % ophthalmic emulsion, methocarbamol (ROBAXIN) 500 MG tablet, Basic metabolic panel, CBC with Differential, Hepatic function panel, Lipid panel, TSH, Vitamin D 25 hydroxy, PTH, intact and calcium   on meds for years risk benefot discussed.   5. Hx of fall  cycloSPORINE (RESTASIS) 0.05 % ophthalmic emulsion, methocarbamol (ROBAXIN) 500 MG tablet, Basic metabolic panel, CBC with Differential, Hepatic function panel, Lipid panel, TSH, Vitamin D 25 hydroxy, PTH, intact and calcium  6. Hx of nonmelanoma skin cancer  cycloSPORINE (RESTASIS) 0.05 % ophthalmic emulsion, methocarbamol (ROBAXIN) 500 MG tablet, Basic metabolic panel, CBC with Differential, Hepatic function panel, Lipid panel, TSH, Vitamin D 25 hydroxy, PTH, intact and calcium  7. Osteoporosis  cycloSPORINE (RESTASIS) 0.05 % ophthalmic emulsion, methocarbamol (ROBAXIN) 500 MG tablet, Basic metabolic panel, CBC with Differential, Hepatic function panel, Lipid panel, TSH, Vitamin D 25 hydroxy, PTH, intact and calcium  8. History of renal stone  cycloSPORINE (RESTASIS) 0.05 % ophthalmic emulsion, methocarbamol (ROBAXIN) 500 MG tablet, Basic metabolic panel, CBC with Differential, Hepatic function panel, Lipid panel, TSH, Vitamin D 25 hydroxy, PTH, intact and calcium   -Patient advised to return or notify health care team   immediately if symptoms worsen or persist or new concerns arise.  Patient Instructions  Trial of  prilosec 20 mg   For 2 weeks  And then as needed  and limit ibuprofen   Can use Tylenol  For now for pain   .  If Gi issues continue then we can do more evaluation.  Weight bearing exercise is good for osteoporosis . And adequate calcium and vitamin D.  Get last 2 years of records and any immmunization data  From dr Chilton Si.   Get a lab appt   Before   Next visit in about a month.     Neta Mends. Panosh M.D.  Patient Care Team: Madelin Headings, MD as PCP - General (Internal Medicine) Oliver Pila, MD as Attending Physician (Obstetrics and Gynecology) Eulas Post, MD as Attending Physician (Orthopedic Surgery) Dorothyann Gibbs, MD (Ophthalmology) Noralee Stain, MD as Attending Physician (Dermatology) Hilarie Fredrickson, MD as Attending Physician (Gastroenterology)

## 2012-02-06 ENCOUNTER — Encounter: Payer: Self-pay | Admitting: Internal Medicine

## 2012-02-06 DIAGNOSIS — Z87442 Personal history of urinary calculi: Secondary | ICD-10-CM | POA: Insufficient documentation

## 2012-02-06 DIAGNOSIS — Z9181 History of falling: Secondary | ICD-10-CM | POA: Insufficient documentation

## 2012-02-06 DIAGNOSIS — M810 Age-related osteoporosis without current pathological fracture: Secondary | ICD-10-CM | POA: Insufficient documentation

## 2012-02-06 DIAGNOSIS — R109 Unspecified abdominal pain: Secondary | ICD-10-CM | POA: Insufficient documentation

## 2012-02-06 DIAGNOSIS — Z85828 Personal history of other malignant neoplasm of skin: Secondary | ICD-10-CM | POA: Insufficient documentation

## 2012-02-26 ENCOUNTER — Other Ambulatory Visit (INDEPENDENT_AMBULATORY_CARE_PROVIDER_SITE_OTHER): Payer: Medicare Other

## 2012-02-26 DIAGNOSIS — K635 Polyp of colon: Secondary | ICD-10-CM

## 2012-02-26 DIAGNOSIS — Z87442 Personal history of urinary calculi: Secondary | ICD-10-CM | POA: Diagnosis not present

## 2012-02-26 DIAGNOSIS — R109 Unspecified abdominal pain: Secondary | ICD-10-CM

## 2012-02-26 DIAGNOSIS — D126 Benign neoplasm of colon, unspecified: Secondary | ICD-10-CM

## 2012-02-26 DIAGNOSIS — M1990 Unspecified osteoarthritis, unspecified site: Secondary | ICD-10-CM

## 2012-02-26 DIAGNOSIS — Z9181 History of falling: Secondary | ICD-10-CM | POA: Diagnosis not present

## 2012-02-26 DIAGNOSIS — M810 Age-related osteoporosis without current pathological fracture: Secondary | ICD-10-CM | POA: Diagnosis not present

## 2012-02-26 DIAGNOSIS — M129 Arthropathy, unspecified: Secondary | ICD-10-CM | POA: Diagnosis not present

## 2012-02-26 DIAGNOSIS — G4700 Insomnia, unspecified: Secondary | ICD-10-CM | POA: Diagnosis not present

## 2012-02-26 DIAGNOSIS — Z85828 Personal history of other malignant neoplasm of skin: Secondary | ICD-10-CM | POA: Diagnosis not present

## 2012-02-26 LAB — LIPID PANEL: VLDL: 18.8 mg/dL (ref 0.0–40.0)

## 2012-02-26 LAB — CBC WITH DIFFERENTIAL/PLATELET
Basophils Absolute: 0 10*3/uL (ref 0.0–0.1)
Basophils Relative: 0.6 % (ref 0.0–3.0)
Hemoglobin: 14.5 g/dL (ref 12.0–15.0)
Lymphocytes Relative: 29.6 % (ref 12.0–46.0)
Monocytes Relative: 8.6 % (ref 3.0–12.0)
Neutro Abs: 3.2 10*3/uL (ref 1.4–7.7)
RBC: 4.64 Mil/uL (ref 3.87–5.11)
RDW: 14 % (ref 11.5–14.6)

## 2012-02-26 LAB — BASIC METABOLIC PANEL
Calcium: 9.1 mg/dL (ref 8.4–10.5)
GFR: 66.01 mL/min (ref >60.00)
Sodium: 137 mEq/L (ref 135–145)

## 2012-02-26 LAB — HEPATIC FUNCTION PANEL
AST: 24 U/L (ref 0–37)
Albumin: 4.3 g/dL (ref 3.5–5.2)
Alkaline Phosphatase: 62 U/L (ref 39–117)

## 2012-02-26 LAB — TSH: TSH: 0.89 u[IU]/mL (ref 0.35–5.50)

## 2012-02-27 LAB — VITAMIN D 25 HYDROXY (VIT D DEFICIENCY, FRACTURES): Vit D, 25-Hydroxy: 77 ng/mL (ref 30–89)

## 2012-02-29 LAB — PTH, INTACT AND CALCIUM: PTH: 28.7 pg/mL (ref 14.0–72.0)

## 2012-03-14 ENCOUNTER — Telehealth: Payer: Self-pay | Admitting: Internal Medicine

## 2012-03-14 ENCOUNTER — Ambulatory Visit (INDEPENDENT_AMBULATORY_CARE_PROVIDER_SITE_OTHER): Payer: Medicare Other | Admitting: Internal Medicine

## 2012-03-14 ENCOUNTER — Encounter: Payer: Self-pay | Admitting: Internal Medicine

## 2012-03-14 VITALS — BP 122/78 | HR 86 | Temp 97.7°F | Wt 119.0 lb

## 2012-03-14 DIAGNOSIS — M810 Age-related osteoporosis without current pathological fracture: Secondary | ICD-10-CM | POA: Diagnosis not present

## 2012-03-14 DIAGNOSIS — E785 Hyperlipidemia, unspecified: Secondary | ICD-10-CM | POA: Diagnosis not present

## 2012-03-14 DIAGNOSIS — Z9189 Other specified personal risk factors, not elsewhere classified: Secondary | ICD-10-CM

## 2012-03-14 DIAGNOSIS — Z87898 Personal history of other specified conditions: Secondary | ICD-10-CM

## 2012-03-14 DIAGNOSIS — R109 Unspecified abdominal pain: Secondary | ICD-10-CM | POA: Diagnosis not present

## 2012-03-14 DIAGNOSIS — R350 Frequency of micturition: Secondary | ICD-10-CM | POA: Insufficient documentation

## 2012-03-14 LAB — POCT URINALYSIS DIPSTICK
Blood, UA: NEGATIVE
Ketones, UA: NEGATIVE
Leukocytes, UA: NEGATIVE
Protein, UA: NEGATIVE
Spec Grav, UA: 1.015
pH, UA: 5.5

## 2012-03-14 NOTE — Progress Notes (Signed)
Chief Complaint  Patient presents with  . Follow-up    Has had urinary frequency for 3-4 days.  . Urinary Frequency    HPI:  Patient comes in today for follow up of  multiple medical problems.  GI  Sx better AFTER 2 weeks    meds and helped the sx and  Now off  .   For at least 3 weeks.   Now has a cough  Recently  No fever   Wheezing.  No sob  urinary frequency for about 3-4 days without hematuria  Flank pain  True dysuria.  ROS: See pertinent positives and negatives per HPI.  Past Medical History  Diagnosis Date  . Arthritis   . Cataract     lens implant  . Osteoporosis     eval dr Sharl Ma in past dexa 11 12   . Kidney stones 1967  . Colon polyps   . Closed pelvic fracture 2012    pelvis and shoulder   . Hx of fall     fracture pelvis and left shoulder humerus  . Hx of nonmelanoma skin cancer     ssca  . MVP (mitral valve prolapse)     in records  no echo eval in records  . Hx of syncope     had neg event monitor per dr Tammy Sours taylor in 2010  . 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 CVA Mother     Died in her 20s complications rheumatic  fever afterwards when she got a stroke  . Lung cancer Sister     Died in 41s was a smoker  . Rheumatic fever Mother   . Thyroid cancer Daughter     Papillary    History   Social History  . Marital Status: Married    Spouse Name: N/A    Number of Children: N/A  . Years of Education: N/A   Social History Main Topics  . Smoking status: Never Smoker   . Smokeless tobacco: None  . Alcohol Use: 1.5 oz/week    3 drink(s) per week  . Drug Use: No  . Sexually Active: None   Other Topics Concern  . 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 AMITRIPTYLINE HCL PO Take 10 mg by mouth Nightly.       . Calcium Carbonate-Vitamin D (CALCIUM-VITAMIN D) 500-200 MG-UNIT per tablet Take 1 tablet by mouth 2 (two) times daily with a meal.        . CLONAZEPAM PO Take 0.5 mg by mouth Nightly.       . cycloSPORINE (RESTASIS) 0.05 % ophthalmic emulsion Place 1 drop into both eyes 2 (two) times daily.      Marland Kitchen glucosamine-chondroitin 500-400 MG tablet Take 1 tablet by mouth daily.        . methocarbamol (ROBAXIN) 500 MG tablet Take 500 mg by mouth. Using about 3 times weekly.      . Multiple Vitamins-Minerals (MULTIVITAL) tablet Take 1 tablet by mouth daily.        Marland Kitchen VITAMIN D, ERGOCALCIFEROL, PO Take by mouth.        . [  DISCONTINUED] fish oil-omega-3 fatty acids 1000 MG capsule Take by mouth daily.         Facility-Administered Encounter Medications as of 03/14/2012  Medication Dose Route Frequency Provider Last Rate Last Dose  . 0.9 %  sodium chloride infusion  500 mL Intravenous Continuous Hilarie Fredrickson, MD        EXAM:  BP 122/78  Pulse 86  Temp 97.7 F (36.5 C) (Oral)  Wt 119 lb (53.978 kg)  SpO2 98%  There is no height on file to calculate BMI.  GENERAL: vitals reviewed and listed above, alert, oriented, appears well hydrated and in no acute distress  HEENT: atraumatic, conjunctiva  clear, no obvious abnormalities on inspection of external nose and ears OP : no lesion edema or exudate   NECK: no obvious masses on inspection palpation   LUNGS: clear to auscultation bilaterally, no wheezes, rales or rhonchi, good air movement  CV: HRRR, no clubbing cyanosis or  peripheral edema nl cap refill   MS: moves all extremities without noticeable focal  abnormality  PSYCH: pleasant and cooperative, no obvious depression or anxiety Lab Results  Component Value Date   WBC 5.6 02/26/2012   HGB 14.5 02/26/2012   HCT 42.8 02/26/2012   PLT 162.0 02/26/2012   GLUCOSE 96  02/26/2012   CHOL 218* 02/26/2012   TRIG 94.0 02/26/2012   HDL 63.20 02/26/2012   LDLDIRECT 146.4 02/26/2012   ALT 19 02/26/2012   AST 24 02/26/2012   NA 137 02/26/2012   K 4.4 02/26/2012   CL 102 02/26/2012   CREATININE 0.9 02/26/2012   BUN 25* 02/26/2012   CO2 28 02/26/2012   TSH 0.89 02/26/2012   INR 0.98 11/11/2010    ASSESSMENT AND PLAN:  Discussed the following assessment and plan:  1. Urinary frequency  POC Urinalysis Dipstick  2. Abdominal discomfort     resolved  on short course  prilosec   3. Osteoporosis     pth vit d ok hx of fosamax  4. Other and unspecified hyperlipidemia     ldl 140 range   ratio 3   5. Hx of syncope      -Patient advised to return or notify health care team  immediately if symptoms worsen or persist or new concerns arise.  Patient Instructions    If  Your stomach  Is better as it seems  You may try small aspirin 81 mg per day or 3 x per week   May recduce rsik of heart atttack and strok.e.  Continue weight bearing exercise . Consider prolia   In future for osteoporosis  Contact us if want to begin thi.   Should have repeat DEXA scan in November  2014 ( 2 year follow up and then make further recommendations.  Will review the records and let you knoe if other   Follow up needed.     Neta Mends. Tangee Marszalek M.D.  Total visit > 50% spent counseling and coordinating care  Record review from Dr Chilton Si and update EHR  as best possible  .

## 2012-03-14 NOTE — Patient Instructions (Signed)
   If  Your stomach  Is better as it seems  You may try small aspirin 81 mg per day or 3 x per week   May recduce rsik of heart atttack and strok.e.  Continue weight bearing exercise . Consider prolia   In future for osteoporosis  Contact us if want to begin thi.   Should have repeat DEXA scan in November  2014 ( 2 year follow up and then make further recommendations.  Will review the records and let you knoe if other   Follow up needed.

## 2012-03-14 NOTE — Telephone Encounter (Signed)
Pt's AVS states she needs a bone scan in Nov. 2014. She said she had her last one at [facility]. Please put in order for it to be done at [facility], and pt will call to sched.

## 2012-03-15 ENCOUNTER — Other Ambulatory Visit: Payer: Self-pay | Admitting: Family Medicine

## 2012-03-15 NOTE — Telephone Encounter (Signed)
Pt notified and given phone number.

## 2012-03-15 NOTE — Telephone Encounter (Signed)
We are unable to order this test through our system for [facility].  She will need to call and make appt @ [phone].  They will then fax over the appropriate paperwork.  Left a message on home phone for the pt to call back.

## 2012-04-02 ENCOUNTER — Encounter: Payer: Self-pay | Admitting: Internal Medicine

## 2012-04-02 DIAGNOSIS — Z87898 Personal history of other specified conditions: Secondary | ICD-10-CM | POA: Insufficient documentation

## 2012-04-02 DIAGNOSIS — E785 Hyperlipidemia, unspecified: Secondary | ICD-10-CM | POA: Insufficient documentation

## 2012-04-11 ENCOUNTER — Telehealth: Payer: Self-pay | Admitting: Internal Medicine

## 2012-04-11 NOTE — Telephone Encounter (Signed)
Pt needs new script in 15 days for CLONAZEPAM PO.  Pt has automatic refill under her old MD and does not want  meds  under his name again. She is a new pt w/ Dr Fabian Sharp.  She will need AMITRIPTYLINE HCL PO in 2 1/2 mos.

## 2012-04-11 NOTE — Telephone Encounter (Signed)
Ok to give #30      0.5 mg at night as needed . Refill x 3

## 2012-04-12 ENCOUNTER — Other Ambulatory Visit: Payer: Self-pay | Admitting: Family Medicine

## 2012-04-12 MED ORDER — CLONAZEPAM 0.5 MG PO TABS: 0.5000 mg | ORAL_TABLET | Freq: Every evening | ORAL | Status: DC | PRN

## 2012-04-12 NOTE — Telephone Encounter (Signed)
Called to the pharmacy and left on voicemail. 

## 2012-06-28 ENCOUNTER — Ambulatory Visit (INDEPENDENT_AMBULATORY_CARE_PROVIDER_SITE_OTHER): Payer: Medicare Other | Admitting: Sports Medicine

## 2012-06-28 VITALS — BP 114/60 | Ht 63.0 in | Wt 115.0 lb

## 2012-06-28 DIAGNOSIS — M25579 Pain in unspecified ankle and joints of unspecified foot: Secondary | ICD-10-CM

## 2012-06-28 DIAGNOSIS — M4120 Other idiopathic scoliosis, site unspecified: Secondary | ICD-10-CM | POA: Insufficient documentation

## 2012-06-28 NOTE — Progress Notes (Signed)
  Subjective:    Patient ID: Jacqueline Burgess, female    DOB: 31-May-1940, 72 y.o.   MRN: [id number]  HPI  Pt presents to clinic for orthotics evaluation. She has been wearing a 3/4 length foam orthotic for many years. Not currently having any foot pain. She was having forefoot pain before she started wearing orthotics years ago.   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, medical  Review of Systems     Objective:   Physical Exam  No acute distress  Interphalangeal joint subluxation bilat great toes  Rt foot 4th MT down Great toe lacks flexion Early hammering of toes 4-5  Lt foot: collapse of 3rd MT head Morton's callus Deviation of PIP joint of 1st toe Bunionette on 5th  Slight hammering of 4th Slight hallux rigidus of great toe  Walking gait: Little push off of great toe bilat Paradoxical upward motion of great toes with push off -- right primarily Rt side swings less than rt  Leg lengths equal SI joints move freely FABER tight bilat  Mild scoliosis Rt side elevation with forward bend 15-18 deg mid thoracic curve     Assessment & Plan:

## 2012-06-28 NOTE — Assessment & Plan Note (Signed)
I suggested keeping some exercises for posture and back rotation up so this does not worsen particularly since she has a history of osteoporosis

## 2012-06-28 NOTE — Assessment & Plan Note (Signed)
She was placed in a sports insole with metatarsal pads  Walking gait was comfortable with this and it seemed to control her forefoot pain  We will need to add metatarsal pads to other shoes  She may be able to get by without a custom orthotic if this helps

## 2012-06-28 NOTE — Patient Instructions (Addendum)
Try green insoles in your shoes  Work on keeping your upper spine straight by doing lateral rotations at the waist from side to side and arms overhead  Standing hip rotations  Please follow up in 1 month if needed  Thank you for seeing [place] today!

## 2012-07-12 ENCOUNTER — Ambulatory Visit: Payer: Medicare Other | Admitting: Internal Medicine

## 2012-07-13 DIAGNOSIS — Z85828 Personal history of other malignant neoplasm of skin: Secondary | ICD-10-CM | POA: Diagnosis not present

## 2012-07-13 DIAGNOSIS — L821 Other seborrheic keratosis: Secondary | ICD-10-CM | POA: Diagnosis not present

## 2012-07-13 DIAGNOSIS — D1801 Hemangioma of skin and subcutaneous tissue: Secondary | ICD-10-CM | POA: Diagnosis not present

## 2012-07-13 DIAGNOSIS — L570 Actinic keratosis: Secondary | ICD-10-CM | POA: Diagnosis not present

## 2012-07-14 ENCOUNTER — Ambulatory Visit: Payer: Medicare Other | Admitting: Internal Medicine

## 2012-09-12 ENCOUNTER — Ambulatory Visit (INDEPENDENT_AMBULATORY_CARE_PROVIDER_SITE_OTHER): Payer: Medicare Other | Admitting: Internal Medicine

## 2012-09-12 ENCOUNTER — Encounter: Payer: Self-pay | Admitting: Internal Medicine

## 2012-09-12 VITALS — BP 110/70 | HR 82 | Temp 97.9°F | Wt 117.0 lb

## 2012-09-12 DIAGNOSIS — R350 Frequency of micturition: Secondary | ICD-10-CM

## 2012-09-12 DIAGNOSIS — M810 Age-related osteoporosis without current pathological fracture: Secondary | ICD-10-CM

## 2012-09-12 DIAGNOSIS — K625 Hemorrhage of anus and rectum: Secondary | ICD-10-CM

## 2012-09-12 DIAGNOSIS — N952 Postmenopausal atrophic vaginitis: Secondary | ICD-10-CM

## 2012-09-12 DIAGNOSIS — G4700 Insomnia, unspecified: Secondary | ICD-10-CM

## 2012-09-12 LAB — POCT URINALYSIS DIPSTICK
Glucose, UA: NEGATIVE
Ketones, UA: NEGATIVE
Spec Grav, UA: 1.025

## 2012-09-12 MED ORDER — CLONAZEPAM 0.5 MG PO TABS: 0.5000 mg | ORAL_TABLET | Freq: Every evening | ORAL | Status: DC | PRN

## 2012-09-12 MED ORDER — ESTROGENS, CONJUGATED 0.625 MG/GM VA CREA: TOPICAL_CREAM | VAGINAL | Status: DC

## 2012-09-12 NOTE — Progress Notes (Signed)
Chief Complaint  Patient presents with  . Follow-up    Would like to know if she needs a seperate vitamin D like she is taking now.  Does not know what her level is.  Wants to know if you will talke over Preparin rx/    HPI: Patient comes in today for follow up of  multiple medical problems.    ? If could have a urine infection.   Get one. Some frequency increase nad unsure .   Using premarin cream  For about 5- 6 years.  Can we take over rx ?    Night  Sleep: continues on  clonopen and amitrtyptiline.   Says stable and not effecting cognition or  Falling   Has been on these for years.     Osteoporosis.  ;waler   Taking calcium  . Has ? Remote hx of fosamax and stopped cause of dentist  Discouraging view of this med.  Remote hx of renal stone  Had epeisode of BRRB but not in stool?  His utd ? On colonoscopy no abd pain weight loss or diarrhea not too worried   ROS: See pertinent positives and negatives per HPI.no cp sob   Past Medical History  Diagnosis Date  . Arthritis   . Cataract     lens implant  . Osteoporosis     eval dr Sharl Ma in past dexa 11 12   . Kidney stones 1967  . Colon polyps   . Closed pelvic fracture 2012    pelvis and shoulder   . Hx of fall     fracture pelvis and left shoulder humerus  . Hx of nonmelanoma skin cancer     ssca  . MVP (mitral valve prolapse)     in records  no echo eval in records  . Hx of syncope     had neg event monitor per dr Tammy Sours taylor in 2010  . 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 CVA Mother     Died in her 73s complications rheumatic  fever afterwards when she got a stroke  . Lung cancer Sister     Died in 18s was a smoker  . Rheumatic fever Mother   . Thyroid cancer Daughter     Papillary    History   Social History  . Marital Status: Married    Spouse Name: N/A    Number of Children: N/A  . Years of Education: N/A   Social History Main Topics  . Smoking  status: Never Smoker   . Smokeless tobacco: None  . Alcohol Use: 1.5 oz/week    3 drink(s) per week  . Drug Use: No  . Sexually Active: None   Other Topics Concern  . 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      g4 P4          Outpatient Encounter Prescriptions as of 09/12/2012  Medication Sig Dispense Refill  . AMITRIPTYLINE HCL PO Take 10 mg by mouth Nightly.       . Calcium Carbonate-Vitamin D (CALCIUM-VITAMIN D) 500-200 MG-UNIT per tablet Take 1 tablet by mouth 2 (two) times daily with a meal.        .  clonazePAM (KLONOPIN) 0.5 MG tablet Take 1 tablet (0.5 mg total) by mouth at bedtime as needed for anxiety.  30 tablet  5  . conjugated estrogens (PREMARIN) vaginal cream Use 3 x per week  Vaginally as directed  42.5 g  6  . cycloSPORINE (RESTASIS) 0.05 % ophthalmic emulsion Place 1 drop into both eyes 2 (two) times daily.      Marland Kitchen glucosamine-chondroitin 500-400 MG tablet Take 1 tablet by mouth daily.        . methocarbamol (ROBAXIN) 500 MG tablet Take 500 mg by mouth. Using about 3 times weekly.      . Multiple Vitamins-Minerals (MULTIVITAL) tablet Take 1 tablet by mouth daily.        Marland Kitchen tretinoin (RETIN-A) 0.025 % cream       . VITAMIN D, ERGOCALCIFEROL, PO Take by mouth.        . [DISCONTINUED] clonazePAM (KLONOPIN) 0.5 MG tablet Take 1 tablet (0.5 mg total) by mouth at bedtime as needed for anxiety.  30 tablet  2  . [DISCONTINUED] PREMARIN vaginal cream       . [DISCONTINUED] CLONAZEPAM PO Take 0.5 mg by mouth Nightly.       . [DISCONTINUED] 0.9 %  sodium chloride infusion        No facility-administered encounter medications on file as of 09/12/2012.    EXAM:  BP 110/70  Pulse 82  Temp(Src) 97.9 F (36.6 C) (Oral)  Wt 117 lb (53.071 kg)  BMI 20.73 kg/m2  SpO2 96%  Body  mass index is 20.73 kg/(m^2).  GENERAL: vitals reviewed and listed above, alert, oriented, appears well hydrated and in no acute distress  HEENT: atraumatic, conjunctiva  clear, no obvious abnormalities on inspection of external nose and ears OP : no lesion edema or exudate   NECK: no obvious masses on inspection palpation  LUNGS: clear to auscultation bilaterally, no wheezes, rales or rhonchi, good air movement CV: HRRR, no clubbing cyanosis or  peripheral edema nl cap refill  MS: moves all extremities without noticeable focal  abnormality PSYCH: pleasant and cooperative, no obvious depression or anxiety Neuro grossly  Non focal.  ASSESSMENT AND PLAN: Discussed the following assessment and plan:  Osteoporosis - hx of fosamax use  get consult  endo dr Lafe Garin  Insomnia - chronic on meds for year risk benefit discussed   Urinary frequency - no obv infection today - Plan: POC Urinalysis Dipstick  Bright red rectal bleeding  Postmenopausal atrophic vaginitis - premarin Ok to rx premarin cream  -Patient advised to return or notify health care team  if symptoms worsen or persist or new concerns arise.  Patient Instructions  Will arrange    Consult with dr Elvera Lennox about   Options acceptable to you for treatment of osteoporosis .  Do stool cards to see  If there is blood in stool that you cannot see  . This could be a hemorrhoid   .   Should make office visit fu with Dr Marina Goodell    After that or as needed.     Continued caution with  Night meds as you are doing.   Wellness visit in 6 months or as needed      Neta Mends. Aunica Dauphinee M.D.

## 2012-09-12 NOTE — Patient Instructions (Signed)
Will arrange    Consult with dr Elvera Lennox about   Options acceptable to you for treatment of osteoporosis .  Do stool cards to see  If there is blood in stool that you cannot see  . This could be a hemorrhoid   .   Should make office visit fu with Dr Marina Goodell    After that or as needed.     Continued caution with  Night meds as you are doing.   Wellness visit in 6 months or as needed

## 2012-09-17 ENCOUNTER — Encounter: Payer: Self-pay | Admitting: Internal Medicine

## 2012-09-17 DIAGNOSIS — N952 Postmenopausal atrophic vaginitis: Secondary | ICD-10-CM | POA: Insufficient documentation

## 2012-10-11 ENCOUNTER — Encounter: Payer: Self-pay | Admitting: Internal Medicine

## 2012-10-11 ENCOUNTER — Ambulatory Visit (INDEPENDENT_AMBULATORY_CARE_PROVIDER_SITE_OTHER): Payer: Medicare Other | Admitting: Internal Medicine

## 2012-10-11 VITALS — BP 112/64 | HR 75 | Temp 97.9°F | Resp 12 | Ht 64.5 in | Wt 114.0 lb

## 2012-10-11 DIAGNOSIS — M810 Age-related osteoporosis without current pathological fracture: Secondary | ICD-10-CM | POA: Diagnosis not present

## 2012-10-11 NOTE — Progress Notes (Signed)
Patient ID: Jacqueline Burgess, female   DOB: [birth year], 72 y.o.   MRN: [id number]   HPI  Jacqueline Burgess is a 72 y.o.-year-old female, referred by her PCP, Dr. Fabian Sharp, for evaluation for osteoporosis.  Pt was dx with OP in ~2000.   I reviewed pt's available DEXA scans (incomplete records): Date L1-L4 T score Hip - L&R T score 33% distal Radius  02/05/2011 - [facility] Deg. changes, stable  stable -3.5 (stable)  01/25/2009 Need records     I reviewed patient's previous imaging results: In 02/06/2000: Xray: R great toe comminuted non-displaced fx - prox. Phalanx - desk fell on toe In 02/11/2003: bone scan: probable L T8 rib fx  Pt had a closed L pelvic (sup and inf pubic rami) fx and a shoulder fx in 2012 - seen on Xray from 11/13/2010, after a fall from level ground (orthostasis).   She denies any recent falls. No dizziness/vertigo/orthostasis.  No h/o hyper/hypocalcemia. No h/o hyperparathyroidism. Has a remote h/o kidney stones in 1964 (pregnancy related).  Lab Results  Component Value Date   PTH 28.7 02/26/2012   CALCIUM 9.1 02/26/2012   CALCIUM 9.6 02/26/2012   CALCIUM 8.2* 11/13/2010   CALCIUM 7.8* 11/12/2010   CALCIUM 9.5 11/11/2010   No h/o thyrotoxicosis. Reviewed TSH recent levels:  Lab Results  Component Value Date   TSH 0.89 02/26/2012   No h/o vitamin D deficiency. Last vit D level was 77 in 02/2012  No h/o CKD. Last BUN/Cr: Lab Results  Component Value Date   BUN 25* 02/26/2012   CREATININE 0.9 02/26/2012   Pt is on calcium citrate - D 500-? Once a day at night,  vitamin D 2000 units. She takes a MVI. She also eats dairy (only a cup in am with cereals),  Yoghurt at night, lots of cheese; and green, leafy, vegetables, at least x a day. She has a previous history of vitamin D deficiency, per review of her chart, and was on ergocalciferol in the past.   Power - walks 45 min a day. No upper body exercises anymore. She was seen Dr. Sharl Ma in the past, and was referred to  physical therapy, which helped some, but he is doing those exercises at home now. She does not take high vitamin A doses.6  She has been on the following OP treatments: Fosamax - total of 3-4 years. Stopped when Bisphosphonates got negative publicity, ~10 years ago.   Pt does not have a FH of osteoporosis. Mother and sister died in their 6s. No FH of kyphosis.   Menopause was at 43y/o. Son with Ehler's Danlos.   She goes on a cruise >> next week and comes back mid Sept.   ROS: Constitutional: no weight gain/loss, no fatigue, no subjective hyperthermia/hypothermia Eyes: no blurry vision, no xerophthalmia ENT: no sore throat, no nodules palpated in throat, no dysphagia/odynophagia, no hoarseness; + decreased hearing Cardiovascular: no CP/SOB/+ palpitations/no leg swelling Respiratory: no cough/SOB Gastrointestinal: no N/V/D/C Musculoskeletal: no muscle/joint aches Skin: no rashes; + hair loss Neurological: no tremors/numbness/tingling/dizziness Psychiatric: no depression/anxiety  Past Medical History  Diagnosis Date  . Arthritis   . Cataract     lens implant  . Osteoporosis     eval dr Sharl Ma in past dexa 11 12   . Kidney stones 1967  . Colon polyps   . Closed pelvic fracture 2012    pelvis and shoulder   . Hx of fall     fracture pelvis and left shoulder humerus  .  Hx of nonmelanoma skin cancer     ssca  . MVP (mitral valve prolapse)     in records  no echo eval in records  . Hx of syncope     had neg event monitor per dr Tammy Sours taylor in 2010  . Hypercalciuria     pu on hctz per record   Past Surgical History  Procedure Laterality Date  . Kidney stone surgery  1967  . Cholecystectomy  1999  . Cataract extraction      left  . Shoulder surgery  11/2010   History   Social History  . Marital Status: Married    Spouse Name: N/A    Number of Children: 4 - 1 son died last year-2013 (hear issues)   Occupational History  . Retired Runner, broadcasting/film/video   Social History Main Topics   . Smoking status: Never Smoker   . Smokeless tobacco: Not on file  . Alcohol Use: 1.5 oz/week    3 drink(s) per week  . Drug Use: No  . 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      g4 P4      Regular exercise: walks daily   Caffeine use: 2 cups of coffee daily   Current Outpatient Prescriptions on File Prior to Visit  Medication Sig Dispense Refill  . AMITRIPTYLINE HCL PO Take 10 mg by mouth Nightly.       . Calcium Carbonate-Vitamin D (CALCIUM-VITAMIN D) 500-200 MG-UNIT per tablet Take 1 tablet by mouth 2 (two) times daily with a meal.        . clonazePAM (KLONOPIN) 0.5 MG tablet Take 1 tablet (0.5 mg total) by mouth at bedtime as needed for anxiety.  30 tablet  5  . conjugated estrogens (PREMARIN) vaginal cream Use 3 x per week  Vaginally as directed  42.5 g  6  . cycloSPORINE (RESTASIS) 0.05 % ophthalmic emulsion Place 1 drop into both eyes 2 (two) times daily.      Marland Kitchen glucosamine-chondroitin 500-400 MG tablet Take 1 tablet by mouth daily.        . methocarbamol (ROBAXIN) 500 MG tablet Take 500 mg by mouth. Using about 3 times weekly.      . Multiple Vitamins-Minerals (MULTIVITAL) tablet Take 1 tablet by mouth daily.        Marland Kitchen tretinoin (RETIN-A) 0.025 % cream       . VITAMIN D, ERGOCALCIFEROL, PO Take by mouth.         No current facility-administered medications on file prior to visit.   Allergies  Allergen Reactions  . Sulfa Antibiotics    Family History  Problem Relation Age of Onset  . CVA Mother     Died in her 69s complications rheumatic  fever afterwards when she got a stroke  . Lung cancer Sister     Died in 75s was a smoker  . Rheumatic fever Mother   . Thyroid cancer Daughter     Papillary   PE: BP 112/64  Pulse 75   Temp(Src) 97.9 F (36.6 C) (Oral)  Resp 12  Ht 5' 4.5" (1.638 m)  Wt 114 lb (51.71 kg)  BMI 19.27 kg/m2  SpO2 95% Wt Readings from Last 3 Encounters:  10/11/12 114 lb (51.71  kg)  09/12/12 117 lb (53.071 kg)  06/28/12 115 lb (52.164 kg)   Constitutional: very thin, in NAD. No kyphosis. Eyes: PERRLA, EOMI, no exophthalmos ENT: moist mucous membranes, no thyromegaly, no cervical lymphadenopathy Cardiovascular: RRR, No MRG Respiratory: CTA B Gastrointestinal: abdomen soft, NT, ND, BS+ Musculoskeletal: no deformities, strength intact in all 4 Skin: moist, warm, no rashes Neurological: no tremor with outstretched hands, DTR normal in all 4  Assessment: 1. Osteoporosis - h/o 3-4 years of po Bisph 10 years ago - h/o fxs  Plan: 1. Osteoporosis - Discussed about increased risk of fracture, depending on the T score, worse with lower T scores. Strongly advised her to consider pharmaceutical treatment for her osteoporosis, which is severe. She has had several fractures, including pelvic. - we had a long discussion about possible treatments for osteoporosis, I explained that I would not start oral bisphosphonates, since we'll would need immediate effect, and the oral bisphosphonate bone accumulation is fairly slow, due to decreased GI absorption. We also discussed about IV bisphosphonate (zoledronic acid), given once a year, and we can definitely try this. Prolia is another option and is appealing because it is given twice a year. My suggestion was actually to start Teriparatide, since her osteoporosis is severe, and she needs rapid BMD increase. We discussed about the administration and possible side effects, and she did not want to have to take it every day, especially since this is a subcutaneous injection. In that case, I suggested that she starts with Prolia and after 3 years we can continue with zoledronic acid. She agrees with the plan. - discussed SEs of Bisphosphonates and Prolia including  atypical fractures and ONJ - no dental workup planned for now. Also discussed about increased cellulitis with Prolia.  - Her last DEXA scan was in 2012, and I intended to get another one this year, however, we need to wait until the end of November to obtain one (2 years from previous), and I don't think we should wait this long without treatment. I do not have reason to believe that her BMD increased from previous check in 2012. - advised for daily weight bearing exercises - she tried with bands but developed shoulder pain >> stopped. Power walks every day for at least 45 min >> encouraged her to continue. - advised her to take 1000 IU vitamin D daily and at least 1200 mg calcium daily - given her specific instructions about food sources for these - see pt instructions  - discussed fall precautions  - will check a vitamin D, CMP for calcium, A phos, and renal fxn, phosphorus  - if all normal, will arrange for Prolia. Since she is leaving for a cruise and returns in a month, will have her back for labs at that time, however, we'll start the preauthorization process now. - we called for records about her DEXA scans from [facility] - I will see her back in 1 year  - Will need a new DEXA after 1 year of Prolia  Obtained records from [facility]:  Date L1-L2 T score Hip - L&R T score 33% distal Radius T score UD radius  02/05/2011 - [facility] Deg. changes, severe scoliosis: -3.1 LFN: - 1.2 RFN: -1.7 -3.5  -4.6   For the 2010 DEXA,  I only have the reading, not the actual images + report: Date L1-L2 T score Total R hip % change 33% distal Radius T score  01/25/2009 n/a -2.4% (sig)  ! Not T score -3.2

## 2012-10-11 NOTE — Patient Instructions (Signed)
Osteoporosis instructions  How Can I Prevent Falls? Men and women with osteoporosis need to take care not to fall down. Falls can break bones. Some reasons people fall are:   Poor vision    Poor balance    Certain diseases that affect how you walk    Some types of medicine, such as sleeping pills.  Some tips to help prevent falls outdoors are:   Use a cane or walker    Wear rubber-soled shoes so you don't slip    Walk on grass when sidewalks are slippery    In winter, put salt or kitty litter on icy sidewalks.  Some ways to help prevent falls indoors are:   Keep rooms free of clutter, especially on floors    Use plastic or carpet runners on slippery floors    Wear low-heeled shoes that provide good support    Do not walk in socks, stockings, or slippers    Be sure carpets and area rugs have skid-proof backs or are tacked to the floor    Be sure stairs are well lit and have rails on both sides    Put grab bars on bathroom walls near tub, shower, and toilet    Use a rubber bath mat in the shower or tub    Keep a flashlight next to your bed    Use a sturdy step stool with a handrail and wide steps    Add more lights in rooms (and night lights)   Buy a cordless phone to keep with you so that you don't have to rush to the phone when it rings and so that you can call for help if you fall.  (adapted from http://www.niams.[employer]ess[employer].at)  Dietary sources of calcium and vitamin D:  Calcium content (mg) - http://www.niams.https://www.gonzalez.org/  Fortified oatmeal, 1 packet 350  Sardines, canned in oil, with edible bones, 3 oz. 324  Cheddar cheese, 1 oz. shredded 306  Milk, nonfat, 1 cup 302  Milkshake, 1 cup 300  Yogurt, plain, low-fat, 1 cup 300  Soybeans, cooked, 1 cup 261  Tofu, firm, with calcium,  cup 204  Orange juice, fortified with calcium, 6 oz. 200-260 (varies)  Salmon, canned, with edible bones, 3 oz. 181  Pudding,  instant, made with 2% milk,  cup 153  Baked beans, 1 cup 142  Cottage cheese, 1% milk fat, 1 cup 138  Spaghetti, lasagna, 1 cup 125  Frozen yogurt, vanilla, soft-serve,  cup 103  Ready-to-eat cereal, fortified with calcium, 1 cup 100-1,000 (varies)  Cheese pizza, 1 slice 100  Fortified waffles, 2 100  Turnip greens, boiled,  cup 99  Broccoli, raw, 1 cup 90  Ice cream, vanilla,  cup 85  Soy or rice milk, fortified with calcium, 1 cup 80-500 (varies)   Vitamin D content (International Units, IU) - https://www.ars.usda.gov Cod liver oil, 1 tablespoon 1,360  Swordfish, cooked, 3 oz 566  Salmon (sockeye), cooked, 3 oz 447  Tuna fish, canned in water, drained, 3 oz 154  Orange juice fortified with vitamin D, 1 cup (check product labels, as amount of added vitamin D varies) 137  Milk, nonfat, reduced fat, and whole, vitamin D-fortified, 1 cup 115-124  Yogurt, fortified with 20% of the daily value for vitamin D, 6 oz 80  Margarine, fortified, 1 tablespoon 60  Sardines, canned in oil, drained, 2 sardines 46  Liver, beef, cooked, 3 oz 42  Egg, 1 large (vitamin D is found in yolk) 41  Ready-to-eat cereal, fortified with 10%  of the daily value for vitamin D, 0.75-1 cup  40  Cheese, Swiss, 1 oz 6   Please return for labs when you return from the cruise.  We will start a preauthorization process for Prolia.

## 2012-10-13 ENCOUNTER — Other Ambulatory Visit: Payer: Self-pay | Admitting: Internal Medicine

## 2012-10-14 ENCOUNTER — Other Ambulatory Visit: Payer: Self-pay | Admitting: Internal Medicine

## 2012-12-05 ENCOUNTER — Telehealth: Payer: Self-pay | Admitting: Internal Medicine

## 2012-12-05 NOTE — Telephone Encounter (Signed)
Returned Jacqueline Burgess's call. Jacqueline Burgess wanted to know the date and time to come and get her Prolia inj. Advised Jacqueline Burgess that it is scheduled for 10/23 at 10:00 am. Jacqueline Burgess understood.

## 2012-12-07 DIAGNOSIS — Z23 Encounter for immunization: Secondary | ICD-10-CM | POA: Diagnosis not present

## 2012-12-29 ENCOUNTER — Ambulatory Visit (INDEPENDENT_AMBULATORY_CARE_PROVIDER_SITE_OTHER): Payer: Medicare Other

## 2012-12-29 DIAGNOSIS — M810 Age-related osteoporosis without current pathological fracture: Secondary | ICD-10-CM

## 2012-12-29 MED ORDER — DENOSUMAB 60 MG/ML ~~LOC~~ SOLN
60.0000 mg | Freq: Once | SUBCUTANEOUS | Status: AC
  Administered 2012-12-29: 60 mg via SUBCUTANEOUS

## 2013-01-09 ENCOUNTER — Telehealth: Payer: Self-pay | Admitting: *Deleted

## 2013-01-09 NOTE — Telephone Encounter (Signed)
It's OK, we can check it this year. She needs to make sure she gets it scheduled at the end of the mo, to be >1 yeat after the previous.

## 2013-01-09 NOTE — Telephone Encounter (Signed)
Called pt and lvm advising her ok to check it this year. You need to make sure you get it scheduled at the end of the month, to be >1 yr after your previous test.

## 2013-01-09 NOTE — Telephone Encounter (Signed)
Pt called and lvm stating that she got a reminder that it is time for her bone density test. She wanted to know if just recently having the Prolia injection effect the test? Pt wants to have it this year because of insurance. [phone]). Please advise

## 2013-01-19 DIAGNOSIS — Z85828 Personal history of other malignant neoplasm of skin: Secondary | ICD-10-CM | POA: Diagnosis not present

## 2013-01-19 DIAGNOSIS — B360 Pityriasis versicolor: Secondary | ICD-10-CM | POA: Diagnosis not present

## 2013-01-19 DIAGNOSIS — L570 Actinic keratosis: Secondary | ICD-10-CM | POA: Diagnosis not present

## 2013-01-19 DIAGNOSIS — L821 Other seborrheic keratosis: Secondary | ICD-10-CM | POA: Diagnosis not present

## 2013-01-19 DIAGNOSIS — B351 Tinea unguium: Secondary | ICD-10-CM | POA: Diagnosis not present

## 2013-01-23 ENCOUNTER — Other Ambulatory Visit: Payer: Self-pay | Admitting: Internal Medicine

## 2013-01-23 NOTE — Telephone Encounter (Signed)
Please review the chart.  I do not think you are prescribing this medication.

## 2013-01-24 ENCOUNTER — Other Ambulatory Visit: Payer: Self-pay | Admitting: Family Medicine

## 2013-01-24 DIAGNOSIS — M810 Age-related osteoporosis without current pathological fracture: Secondary | ICD-10-CM

## 2013-01-24 NOTE — Telephone Encounter (Signed)
It is possible she was still using the refills left over from her previous physician. I can take this over however please call the patient and ask her the previous per prescribing provider And the purpose of the medicine.  (I think it is for sleep.) We can then refill this for 3 months    Until her wellness visit

## 2013-01-26 NOTE — Telephone Encounter (Signed)
Left message on home phone for the pt to return my call. 

## 2013-01-30 NOTE — Telephone Encounter (Signed)
Spoke to the patient's husband.  Pt is not home.  Will be home tomorrow.  Will try again then.

## 2013-01-31 ENCOUNTER — Telehealth: Payer: Self-pay | Admitting: Family Medicine

## 2013-01-31 NOTE — Telephone Encounter (Signed)
Pt wanted to report that she continues to have palpitations.  An ongoing problems.  Seems to bother her husband more that herself.  Always happens when she is still.  Whether she is sitting and laying in bed.  She just wanted to let you know what she is still having this problem.  Will talk to you when she comes in for her physical in Jan.  Please advise if you have further instructions.

## 2013-02-09 DIAGNOSIS — Z1231 Encounter for screening mammogram for malignant neoplasm of breast: Secondary | ICD-10-CM | POA: Diagnosis not present

## 2013-02-09 DIAGNOSIS — M810 Age-related osteoporosis without current pathological fracture: Secondary | ICD-10-CM | POA: Diagnosis not present

## 2013-02-09 LAB — HM MAMMOGRAPHY

## 2013-02-09 NOTE — Telephone Encounter (Signed)
Information noted  If having increasing problems or lightheadedness chest pain etc she can be seen in office for this  Otherwise she can wait until visit in January

## 2013-02-10 ENCOUNTER — Encounter: Payer: Self-pay | Admitting: Internal Medicine

## 2013-03-14 ENCOUNTER — Encounter: Payer: Self-pay | Admitting: Internal Medicine

## 2013-03-14 ENCOUNTER — Ambulatory Visit (INDEPENDENT_AMBULATORY_CARE_PROVIDER_SITE_OTHER): Payer: Medicare Other | Admitting: Internal Medicine

## 2013-03-14 VITALS — BP 112/72 | HR 85 | Temp 97.8°F | Ht 64.0 in | Wt 113.0 lb

## 2013-03-14 DIAGNOSIS — M810 Age-related osteoporosis without current pathological fracture: Secondary | ICD-10-CM | POA: Insufficient documentation

## 2013-03-14 DIAGNOSIS — E785 Hyperlipidemia, unspecified: Secondary | ICD-10-CM | POA: Insufficient documentation

## 2013-03-14 DIAGNOSIS — Z79899 Other long term (current) drug therapy: Secondary | ICD-10-CM | POA: Diagnosis not present

## 2013-03-14 DIAGNOSIS — Z23 Encounter for immunization: Secondary | ICD-10-CM | POA: Diagnosis not present

## 2013-03-14 DIAGNOSIS — G4700 Insomnia, unspecified: Secondary | ICD-10-CM

## 2013-03-14 DIAGNOSIS — Z0000 Encounter for general adult medical examination without abnormal findings: Secondary | ICD-10-CM

## 2013-03-14 LAB — BASIC METABOLIC PANEL
BUN: 25 mg/dL — ABNORMAL HIGH (ref 6–23)
CHLORIDE: 101 meq/L (ref 96–112)
CO2: 29 mEq/L (ref 19–32)
Calcium: 9.1 mg/dL (ref 8.4–10.5)
Creatinine, Ser: 0.8 mg/dL (ref 0.4–1.2)
GFR: 76.64 mL/min (ref >60.00)
Glucose, Bld: 88 mg/dL (ref 70–99)
Potassium: 4.5 mEq/L (ref 3.5–5.1)
Sodium: 137 mEq/L (ref 135–145)

## 2013-03-14 LAB — LIPID PANEL
CHOL/HDL RATIO: 4
Cholesterol: 216 mg/dL — ABNORMAL HIGH (ref 0–200)
HDL: 61 mg/dL (ref >39.00)
Triglycerides: 120 mg/dL (ref 0.0–149.0)
VLDL: 24 mg/dL (ref 0.0–40.0)

## 2013-03-14 LAB — LDL CHOLESTEROL, DIRECT: LDL DIRECT: 136.8 mg/dL

## 2013-03-14 LAB — TSH: TSH: 0.43 u[IU]/mL (ref 0.35–5.50)

## 2013-03-14 MED ORDER — AMITRIPTYLINE HCL 10 MG PO TABS: ORAL_TABLET | ORAL | Status: DC

## 2013-03-14 MED ORDER — CLONAZEPAM 0.5 MG PO TABS: ORAL_TABLET | ORAL | Status: DC

## 2013-03-14 NOTE — Progress Notes (Signed)
Chief Complaint  Patient presents with  . Medicare Wellness    med refills  mail; away    HPI: Patient comes in today for Preventive Medicare wellness visit . No major injuries, ed visits ,hospitalizations , new medications since last visit. onprolia  Sleep  chornnic insomnia : Clonazepam taking 1/2   q hs And amitriptyline for sleep and does well. Denies se . Has been doing this for years and cant sleep without at this time . Needs refill for 90 days . Osteoporosis : saw Dr Darnell Level now on prolia  No problems at this time. UTD on mammo  Had dexa 8 14 and showed -4.2 radius    Health Maintenance  Topic Date Due  . Tetanus/tdap  10/02/1957  . Influenza Vaccine  10/07/2013  . Mammogram  02/10/2015  . Colonoscopy  07/07/2015  . Pneumococcal Polysaccharide Vaccine Age 34 And Over  Addressed  . Zostavax  Addressed   Health Maintenance Review    Hearing:  Aids   Vision:  No limitations at present . Last eye check UTD  Safety:  Has smoke detector and wears seat belts.  No firearms. No excess sun exposure. Sees dentist regularly.  Falls: no  Advance directive :  Reviewed  Memory: Felt to be good  , no concern from her or her family.ok   Depression: No anhedonia unusual crying or depressive symptoms  Nutrition: Eats well balanced diet; adequate calcium and vitamin D. No swallowing chewing problems.  Injury: no major injuries in the last six months.  Other healthcare providers:  Reviewed today .  Social:  Lives with spouse married. No pets.   Preventive parameters: up-to-date  Reviewed   ADLS:   There are no problems or need for assistance  driving, feeding, obtaining food, dressing, toileting and bathing, managing money using phone. She is independent.  EXERCISE/ HABITS  Walks daily 45 minutes  No ex intolerance . Per week   No tobacco   3 pre week  etoh    ROS:  GEN/ HEENT: No fever, significant weight changes sweats headaches vision problems hearing changes, has hearing  aids no in today CV/ PULM; No chest pain shortness of breath cough, syncope,edema  change in exercise tolerance. GI /GU: No adominal pain, vomiting, change in bowel habits. No blood in the stool. No significant GU symptoms. SKIN/HEME: ,no acute skin rashes suspicious lesions or bleeding. No lymphadenopathy, nodules, masses.  NEURO/ PSYCH:  No neurologic signs such as weakness numbness. No depression anxiety. IMM/ Allergy: No unusual infections.  Allergy .   REST of 12 system review negative except as per HPI   Past Medical History  Diagnosis Date  . Arthritis   . Cataract     lens implant  . Osteoporosis     eval dr Buddy Duty in past dexa 11 12   . Kidney stones 1967  . Colon polyps   . Closed pelvic fracture 2012    pelvis and shoulder   . Hx of fall     fracture pelvis and left shoulder humerus  . Hx of nonmelanoma skin cancer     ssca  . MVP (mitral valve prolapse)     in records  no echo eval in records  . Hx of syncope     had neg event monitor per dr Marya Amsler taylor in 2010  . 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 CVA Mother  Died in her 09Q complications rheumatic  fever afterwards when she got a stroke  . Lung cancer Sister     Died in 23s was a smoker  . Rheumatic fever Mother   . Thyroid cancer Daughter     Papillary    History   Social History  . Marital Status: Married    Spouse Name: N/A    Number of Children: N/A  . Years of Education: N/A   Social History Main Topics  . Smoking status: Never Smoker   . Smokeless tobacco: None  . Alcohol Use: 1.5 oz/week    3 drink(s) per week  . Drug Use: No  . Sexual Activity: Yes    Partners: Female   Other Topics Concern  . None   Social History Narrative   Married retired Pharmacist, hospital household of 28 hours of sleep social alcohol 3 times a week at the most negative tobacco 2 caffeine a day uses seatbelts in the car does regular exercises walking has a  hearing aid.      g4 P4      Regular exercise: walks daily   Caffeine use: 2 cups of coffee daily          Outpatient Encounter Prescriptions as of 03/14/2013  Medication Sig  . amitriptyline (ELAVIL) 10 MG tablet TAKE 1 TABLET BY MOUTH EVERY NIGHT AT BEDTIME  . Calcium Carbonate-Vitamin D (CALCIUM-VITAMIN D) 500-200 MG-UNIT per tablet Take 1 tablet by mouth 2 (two) times daily with a meal.    . clonazePAM (KLONOPIN) 0.5 MG tablet TAKE 1 TABLET BY MOUTH AT BEDTIME AS NEEDED  . glucosamine-chondroitin 500-400 MG tablet Take 1 tablet by mouth daily.    . methocarbamol (ROBAXIN) 500 MG tablet Take 500 mg by mouth. Using about 3 times weekly.  . Multiple Vitamins-Minerals (MULTIVITAL) tablet Take 1 tablet by mouth daily.    Marland Kitchen tretinoin (RETIN-A) 0.025 % cream   . VITAMIN D, ERGOCALCIFEROL, PO Take by mouth.    . [DISCONTINUED] amitriptyline (ELAVIL) 10 MG tablet TAKE 1 TABLET BY MOUTH EVERY NIGHT AT BEDTIME  . [DISCONTINUED] clonazePAM (KLONOPIN) 0.5 MG tablet TAKE 1 TABLET BY MOUTH AT BEDTIME AS NEEDED  . conjugated estrogens (PREMARIN) vaginal cream Use 3 x per week  Vaginally as directed  . cycloSPORINE (RESTASIS) 0.05 % ophthalmic emulsion Place 1 drop into both eyes 2 (two) times daily.  . [DISCONTINUED] AMITRIPTYLINE HCL PO Take 10 mg by mouth Nightly.     EXAM:  BP 112/72  Pulse 85  Temp(Src) 97.8 F (36.6 C) (Oral)  Ht '5\' 4"'  (1.626 m)  Wt 113 lb (51.256 kg)  BMI 19.39 kg/m2  SpO2 95%  Body mass index is 19.39 kg/(m^2).  Physical Exam: Vital signs reviewed ZRA:QTMA is a well-developed well-nourished alert cooperative   who appears stated age in no acute distress.  HEENT: normocephalic atraumatic , Eyes: PERRL EOM's full, conjunctiva clear, Nares: paten,t no deformity discharge or tenderness., Ears: no deformity EAC's clear TMs with normal landmarks. Mouth: clear OP, no lesions, edema.  Moist mucous membranes. Dentition in adequate repair. NECK: supple without masses,  thyromegaly or bruits. CHEST/PULM:  Clear to auscultation and percussion breath sounds equal no wheeze , rales or rhonchi. No chest wall deformities or tenderness. Breast: normal by inspection . No dimpling, discharge, masses, tenderness or discharge . CV: PMI is nondisplaced, S1 S2 no gallops, murmurs, rubs. Peripheral pulses are full without delay.No JVD .  ABDOMEN: Bowel sounds normal nontender  No guard or rebound,  no hepato splenomegal no CVA tenderness.  No hernia. Extremtities:  No clubbing cyanosis or edema, no acute joint swelling or redness no focal atrophy NEURO:  Oriented x3, cranial nerves 3-12 appear to be intact, no obvious focal weakness,gait within normal limits no abnormal reflexes or asymmetrical SKIN: No acute rashes normal turgor, color, no bruising or petechiae.sun changes  PSYCH: Oriented, good eye contact, no obvious depression anxiety, cognition and judgment appear normal. LN: no cervical axillary inguinal adenopathy No noted deficits in memory, attention, and speech.   Lab Results  Component Value Date   WBC 5.6 02/26/2012   HGB 14.5 02/26/2012   HCT 42.8 02/26/2012   PLT 162.0 02/26/2012   GLUCOSE 96 02/26/2012   CHOL 218* 02/26/2012   TRIG 94.0 02/26/2012   HDL 63.20 02/26/2012   LDLDIRECT 146.4 02/26/2012   ALT 19 02/26/2012   AST 24 02/26/2012   NA 137 02/26/2012   K 4.4 02/26/2012   CL 102 02/26/2012   CREATININE 0.9 02/26/2012   BUN 25* 02/26/2012   CO2 28 02/26/2012   TSH 0.89 02/26/2012   INR 0.98 11/11/2010    ASSESSMENT AND PLAN:  Discussed the following assessment and plan:  Medicare annual wellness visit, initial - prevnar  - Plan: Basic metabolic panel, Lipid panel, TSH  Osteoporosis, unspecified - prolia dexa radius 8 14 -4.2 - Plan: Basic metabolic panel, Lipid panel, TSH  Insomnia - long term ,stable successful use of meds ;continue same meds for now benefit more than risk reeval every 6-12 months  - Plan: Basic metabolic panel,  Lipid panel, TSH  High risk medication use - clonipen - Plan: Basic metabolic panel, Lipid panel, TSH  Need for vaccination with 13-polyvalent pneumococcal conjugate vaccine - Plan: Pneumococcal conjugate vaccine 13-valent  Osteoporosis  Elevated lipids  Patient Care Team: Burnis Medin, MD as PCP - General (Internal Medicine) Logan Bores, MD as Attending Physician (Obstetrics and Gynecology) Johnny Bridge, MD as Attending Physician (Orthopedic Surgery) Deliah Goody, MD (Ophthalmology) Selinda Orion, MD as Attending Physician (Dermatology) Irene Shipper, MD as Attending Physician (Gastroenterology)  Patient Instructions  Continue lifestyle intervention healthy eating and exercise . Refill meds .  Continued caution with sleep meds to avoid falling and mental fogginess.  ROV in 6 months for med check .  Preventive Care for Adults, Female A healthy lifestyle and preventive care can promote health and wellness. Preventive health guidelines for women include the following key practices.  A routine yearly physical is a good way to check with your caregiver about your health and preventive screening. It is a chance to share any concerns and updates on your health, and to receive a thorough exam.  Visit your dentist for a routine exam and preventive care every 6 months. Brush your teeth twice a day and floss once a day. Good oral hygiene prevents tooth decay and gum disease.  The frequency of eye exams is based on your age, health, family medical history, use of contact lenses, and other factors. Follow your caregiver's recommendations for frequency of eye exams.  Eat a healthy diet. Foods like vegetables, fruits, whole grains, low-fat dairy products, and lean protein foods contain the nutrients you need without too many calories. Decrease your intake of foods high in solid fats, added sugars, and salt. Eat the right amount of calories for you.Get information about a proper diet  from your caregiver, if necessary.  Regular physical exercise is one of the most important things you  can do for your health. Most adults should get at least 150 minutes of moderate-intensity exercise (any activity that increases your heart rate and causes you to sweat) each week. In addition, most adults need muscle-strengthening exercises on 2 or more days a week.  Maintain a healthy weight. The body mass index (BMI) is a screening tool to identify possible weight problems. It provides an estimate of body fat based on height and weight. Your caregiver can help determine your BMI, and can help you achieve or maintain a healthy weight.For adults 20 years and older:  A BMI below 18.5 is considered underweight.  A BMI of 18.5 to 24.9 is normal.  A BMI of 25 to 29.9 is considered overweight.  A BMI of 30 and above is considered obese.  Maintain normal blood lipids and cholesterol levels by exercising and minimizing your intake of saturated fat. Eat a balanced diet with plenty of fruit and vegetables. Blood tests for lipids and cholesterol should begin at age 48 and be repeated every 5 years. If your lipid or cholesterol levels are high, you are over 50, or you are at high risk for heart disease, you may need your cholesterol levels checked more frequently.Ongoing high lipid and cholesterol levels should be treated with medicines if diet and exercise are not effective.  If you smoke, find out from your caregiver how to quit. If you do not use tobacco, do not start.  Lung cancer screening is recommended for adults aged 15 80 years who are at high risk for developing lung cancer because of a history of smoking. Yearly low-dose computed tomography (CT) is recommended for people who have at least a 30-pack-year history of smoking and are a current smoker or have quit within the past 15 years. A pack year of smoking is smoking an average of 1 pack of cigarettes a day for 1 year (for example: 1 pack a day  for 30 years or 2 packs a day for 15 years). Yearly screening should continue until the smoker has stopped smoking for at least 15 years. Yearly screening should also be stopped for people who develop a health problem that would prevent them from having lung cancer treatment.  If you are pregnant, do not drink alcohol. If you are breastfeeding, be very cautious about drinking alcohol. If you are not pregnant and choose to drink alcohol, do not exceed 1 drink per day. One drink is considered to be 12 ounces (355 mL) of beer, 5 ounces (148 mL) of wine, or 1.5 ounces (44 mL) of liquor.  Avoid use of street drugs. Do not share needles with anyone. Ask for help if you need support or instructions about stopping the use of drugs.  High blood pressure causes heart disease and increases the risk of stroke. Your blood pressure should be checked at least every 1 to 2 years. Ongoing high blood pressure should be treated with medicines if weight loss and exercise are not effective.  If you are 37 to 75 years old, ask your caregiver if you should take aspirin to prevent strokes.  Diabetes screening involves taking a blood sample to check your fasting blood sugar level. This should be done once every 3 years, after age 47, if you are within normal weight and without risk factors for diabetes. Testing should be considered at a younger age or be carried out more frequently if you are overweight and have at least 1 risk factor for diabetes.  Breast cancer screening is essential  preventive care for women. You should practice "breast self-awareness." This means understanding the normal appearance and feel of your breasts and may include breast self-examination. Any changes detected, no matter how small, should be reported to a caregiver. Women in their 23s and 30s should have a clinical breast exam (CBE) by a caregiver as part of a regular health exam every 1 to 3 years. After age 9, women should have a CBE every year.  Starting at age 42, women should consider having a mammography (breast X-ray test) every year. Women who have a family history of breast cancer should talk to their caregiver about genetic screening. Women at a high risk of breast cancer should talk to their caregivers about having magnetic resonance imaging (MRI) and a mammography every year.  Breast cancer gene (BRCA)-related cancer risk assessment is recommended for women who have family members with BRCA-related cancers. BRCA-related cancers include breast, ovarian, tubal, and peritoneal cancers. Having family members with these cancers may be associated with an increased risk for harmful changes (mutations) in the breast cancer genes BRCA1 and BRCA2. Results of the assessment will determine the need for genetic counseling and BRCA1 and BRCA2 testing.  The Pap test is a screening test for cervical cancer. A Pap test can show cell changes on the cervix that might become cervical cancer if left untreated. A Pap test is a procedure in which cells are obtained and examined from the lower end of the uterus (cervix).  Women should have a Pap test starting at age 44.  Between ages 41 and 34, Pap tests should be repeated every 2 years.  Beginning at age 42, you should have a Pap test every 3 years as long as the past 3 Pap tests have been normal.  Some women have medical problems that increase the chance of getting cervical cancer. Talk to your caregiver about these problems. It is especially important to talk to your caregiver if a new problem develops soon after your last Pap test. In these cases, your caregiver may recommend more frequent screening and Pap tests.  The above recommendations are the same for women who have or have not gotten the vaccine for human papillomavirus (HPV).  If you had a hysterectomy for a problem that was not cancer or a condition that could lead to cancer, then you no longer need Pap tests. Even if you no longer need a Pap  test, a regular exam is a good idea to make sure no other problems are starting.  If you are between ages 27 and 75, and you have had normal Pap tests going back 10 years, you no longer need Pap tests. Even if you no longer need a Pap test, a regular exam is a good idea to make sure no other problems are starting.  If you have had past treatment for cervical cancer or a condition that could lead to cancer, you need Pap tests and screening for cancer for at least 20 years after your treatment.  If Pap tests have been discontinued, risk factors (such as a new sexual partner) need to be reassessed to determine if screening should be resumed.  The HPV test is an additional test that may be used for cervical cancer screening. The HPV test looks for the virus that can cause the cell changes on the cervix. The cells collected during the Pap test can be tested for HPV. The HPV test could be used to screen women aged 19 years and older, and should  be used in women of any age who have unclear Pap test results. After the age of 45, women should have HPV testing at the same frequency as a Pap test.  Colorectal cancer can be detected and often prevented. Most routine colorectal cancer screening begins at the age of 45 and continues through age 30. However, your caregiver may recommend screening at an earlier age if you have risk factors for colon cancer. On a yearly basis, your caregiver may provide home test kits to check for hidden blood in the stool. Use of a small camera at the end of a tube, to directly examine the colon (sigmoidoscopy or colonoscopy), can detect the earliest forms of colorectal cancer. Talk to your caregiver about this at age 31, when routine screening begins. Direct examination of the colon should be repeated every 5 to 10 years through age 56, unless early forms of pre-cancerous polyps or small growths are found.  Hepatitis C blood testing is recommended for all people born from [birth year] through  1965 and any individual with known risks for hepatitis C.  Practice safe sex. Use condoms and avoid high-risk sexual practices to reduce the spread of sexually transmitted infections (STIs). STIs include gonorrhea, chlamydia, syphilis, trichomonas, herpes, HPV, and human immunodeficiency virus (HIV). Herpes, HIV, and HPV are viral illnesses that have no cure. They can result in disability, cancer, and death. Sexually active women aged 42 and younger should be checked for chlamydia. Older women with new or multiple partners should also be tested for chlamydia. Testing for other STIs is recommended if you are sexually active and at increased risk.  Osteoporosis is a disease in which the bones lose minerals and strength with aging. This can result in serious bone fractures. The risk of osteoporosis can be identified using a bone density scan. Women ages 27 and over and women at risk for fractures or osteoporosis should discuss screening with their caregivers. Ask your caregiver whether you should take a calcium supplement or vitamin D to reduce the rate of osteoporosis.  Menopause can be associated with physical symptoms and risks. Hormone replacement therapy is available to decrease symptoms and risks. You should talk to your caregiver about whether hormone replacement therapy is right for you.  Use sunscreen. Apply sunscreen liberally and repeatedly throughout the day. You should seek shade when your shadow is shorter than you. Protect yourself by wearing long sleeves, pants, a wide-brimmed hat, and sunglasses year round, whenever you are outdoors.  Once a month, do a whole body skin exam, using a mirror to look at the skin on your back. Notify your caregiver of new moles, moles that have irregular borders, moles that are larger than a pencil eraser, or moles that have changed in shape or color.  Stay current with required immunizations.  Influenza vaccine. All adults should be immunized every  year.  Tetanus, diphtheria, and acellular pertussis (Td, Tdap) vaccine. Pregnant women should receive 1 dose of Tdap vaccine during each pregnancy. The dose should be obtained regardless of the length of time since the last dose. Immunization is preferred during the 27th to 36th week of gestation. An adult who has not previously received Tdap or who does not know her vaccine status should receive 1 dose of Tdap. This initial dose should be followed by tetanus and diphtheria toxoids (Td) booster doses every 10 years. Adults with an unknown or incomplete history of completing a 3-dose immunization series with Td-containing vaccines should begin or complete a primary immunization series  including a Tdap dose. Adults should receive a Td booster every 10 years.  Varicella vaccine. An adult without evidence of immunity to varicella should receive 2 doses or a second dose if she has previously received 1 dose. Pregnant females who do not have evidence of immunity should receive the first dose after pregnancy. This first dose should be obtained before leaving the health care facility. The second dose should be obtained 4 8 weeks after the first dose.  Human papillomavirus (HPV) vaccine. Females aged 86 26 years who have not received the vaccine previously should obtain the 3-dose series. The vaccine is not recommended for use in pregnant females. However, pregnancy testing is not needed before receiving a dose. If a female is found to be pregnant after receiving a dose, no treatment is needed. In that case, the remaining doses should be delayed until after the pregnancy. Immunization is recommended for any person with an immunocompromised condition through the age of 47 years if she did not get any or all doses earlier. During the 3-dose series, the second dose should be obtained 4 8 weeks after the first dose. The third dose should be obtained 24 weeks after the first dose and 16 weeks after the second  dose.  Zoster vaccine. One dose is recommended for adults aged 20 years or older unless certain conditions are present.  Measles, mumps, and rubella (MMR) vaccine. Adults born before 38 generally are considered immune to measles and mumps. Adults born in [birth year] or later should have 1 or more doses of MMR vaccine unless there is a contraindication to the vaccine or there is laboratory evidence of immunity to each of the three diseases. A routine second dose of MMR vaccine should be obtained at least 28 days after the first dose for students attending postsecondary schools, health care workers, or international travelers. People who received inactivated measles vaccine or an unknown type of measles vaccine during 1963 1967 should receive 2 doses of MMR vaccine. People who received inactivated mumps vaccine or an unknown type of mumps vaccine before 1979 and are at high risk for mumps infection should consider immunization with 2 doses of MMR vaccine. For females of childbearing age, rubella immunity should be determined. If there is no evidence of immunity, females who are not pregnant should be vaccinated. If there is no evidence of immunity, females who are pregnant should delay immunization until after pregnancy. Unvaccinated health care workers born before 65 who lack laboratory evidence of measles, mumps, or rubella immunity or laboratory confirmation of disease should consider measles and mumps immunization with 2 doses of MMR vaccine or rubella immunization with 1 dose of MMR vaccine.  Pneumococcal 13-valent conjugate (PCV13) vaccine. When indicated, a person who is uncertain of her immunization history and has no record of immunization should receive the PCV13 vaccine. An adult aged 40 years or older who has certain medical conditions and has not been previously immunized should receive 1 dose of PCV13 vaccine. This PCV13 should be followed with a dose of pneumococcal polysaccharide (PPSV23) vaccine.  The PPSV23 vaccine dose should be obtained at least 8 weeks after the dose of PCV13 vaccine. An adult aged 75 years or older who has certain medical conditions and previously received 1 or more doses of PPSV23 vaccine should receive 1 dose of PCV13. The PCV13 vaccine dose should be obtained 1 or more years after the last PPSV23 vaccine dose.  Pneumococcal polysaccharide (PPSV23) vaccine. When PCV13 is also indicated, PCV13 should be obtained  first. All adults aged 75 years and older should be immunized. An adult younger than age 65 years who has certain medical conditions should be immunized. Any person who resides in a nursing home or long-term care facility should be immunized. An adult smoker should be immunized. People with an immunocompromised condition and certain other conditions should receive both PCV13 and PPSV23 vaccines. People with human immunodeficiency virus (HIV) infection should be immunized as soon as possible after diagnosis. Immunization during chemotherapy or radiation therapy should be avoided. Routine use of PPSV23 vaccine is not recommended for American Indians, [place] Natives, or people younger than 65 years unless there are medical conditions that require PPSV23 vaccine. When indicated, people who have unknown immunization and have no record of immunization should receive PPSV23 vaccine. One-time revaccination 5 years after the first dose of PPSV23 is recommended for people aged 9 64 years who have chronic kidney failure, nephrotic syndrome, asplenia, or immunocompromised conditions. People who received 1 2 doses of PPSV23 before age 89 years should receive another dose of PPSV23 vaccine at age 86 years or later if at least 5 years have passed since the previous dose. Doses of PPSV23 are not needed for people immunized with PPSV23 at or after age 91 years.  Meningococcal vaccine. Adults with asplenia or persistent complement component deficiencies should receive 2 doses of quadrivalent  meningococcal conjugate (MenACWY-D) vaccine. The doses should be obtained at least 2 months apart. Microbiologists working with certain meningococcal bacteria, [place] recruits, people at risk during an outbreak, and people who travel to or live in countries with a high rate of meningitis should be immunized. A first-year college student up through age 37 years who is living in a residence hall should receive a dose if she did not receive a dose on or after her 16th birthday. Adults who have certain high-risk conditions should receive one or more doses of vaccine.  Hepatitis A vaccine. Adults who wish to be protected from this disease, have certain high-risk conditions, work with hepatitis A-infected animals, work in hepatitis A research labs, or travel to or work in countries with a high rate of hepatitis A should be immunized. Adults who were previously unvaccinated and who anticipate close contact with an international adoptee during the first 60 days after arrival in the [place] from a country with a high rate of hepatitis A should be immunized.  Hepatitis B vaccine. Adults who wish to be protected from this disease, have certain high-risk conditions, may be exposed to blood or other infectious body fluids, are household contacts or sex partners of hepatitis B positive people, are clients or workers in certain care facilities, or travel to or work in countries with a high rate of hepatitis B should be immunized.  Haemophilus influenzae type b (Hib) vaccine. A previously unvaccinated person with asplenia or sickle cell disease or having a scheduled splenectomy should receive 1 dose of Hib vaccine. Regardless of previous immunization, a recipient of a hematopoietic stem cell transplant should receive a 3-dose series 6 12 months after her successful transplant. Hib vaccine is not recommended for adults with HIV infection.     Standley Brooking. Deontra Pereyra M.D.    Pre visit review using our clinic review  tool, if applicable. No additional management support is needed unless otherwise documented below in the visit note.

## 2013-03-14 NOTE — Assessment & Plan Note (Signed)
prolia ;dexa radius 8 /14 -4.2

## 2013-03-14 NOTE — Patient Instructions (Signed)
Continue lifestyle intervention healthy eating and exercise . Refill meds .  Continued caution with sleep meds to avoid falling and mental fogginess.  ROV in 6 months for med check .  Preventive Care for Adults, Female A healthy lifestyle and preventive care can promote health and wellness. Preventive health guidelines for women include the following key practices.  A routine yearly physical is a good way to check with your caregiver about your health and preventive screening. It is a chance to share any concerns and updates on your health, and to receive a thorough exam.  Visit your dentist for a routine exam and preventive care every 6 months. Brush your teeth twice a day and floss once a day. Good oral hygiene prevents tooth decay and gum disease.  The frequency of eye exams is based on your age, health, family medical history, use of contact lenses, and other factors. Follow your caregiver's recommendations for frequency of eye exams.  Eat a healthy diet. Foods like vegetables, fruits, whole grains, low-fat dairy products, and lean protein foods contain the nutrients you need without too many calories. Decrease your intake of foods high in solid fats, added sugars, and salt. Eat the right amount of calories for you.Get information about a proper diet from your caregiver, if necessary.  Regular physical exercise is one of the most important things you can do for your health. Most adults should get at least 150 minutes of moderate-intensity exercise (any activity that increases your heart rate and causes you to sweat) each week. In addition, most adults need muscle-strengthening exercises on 2 or more days a week.  Maintain a healthy weight. The body mass index (BMI) is a screening tool to identify possible weight problems. It provides an estimate of body fat based on height and weight. Your caregiver can help determine your BMI, and can help you achieve or maintain a healthy weight.For adults  20 years and older:  A BMI below 18.5 is considered underweight.  A BMI of 18.5 to 24.9 is normal.  A BMI of 25 to 29.9 is considered overweight.  A BMI of 30 and above is considered obese.  Maintain normal blood lipids and cholesterol levels by exercising and minimizing your intake of saturated fat. Eat a balanced diet with plenty of fruit and vegetables. Blood tests for lipids and cholesterol should begin at age 70 and be repeated every 5 years. If your lipid or cholesterol levels are high, you are over 50, or you are at high risk for heart disease, you may need your cholesterol levels checked more frequently.Ongoing high lipid and cholesterol levels should be treated with medicines if diet and exercise are not effective.  If you smoke, find out from your caregiver how to quit. If you do not use tobacco, do not start.  Lung cancer screening is recommended for adults aged 34 80 years who are at high risk for developing lung cancer because of a history of smoking. Yearly low-dose computed tomography (CT) is recommended for people who have at least a 30-pack-year history of smoking and are a current smoker or have quit within the past 15 years. A pack year of smoking is smoking an average of 1 pack of cigarettes a day for 1 year (for example: 1 pack a day for 30 years or 2 packs a day for 15 years). Yearly screening should continue until the smoker has stopped smoking for at least 15 years. Yearly screening should also be stopped for people who develop a  health problem that would prevent them from having lung cancer treatment.  If you are pregnant, do not drink alcohol. If you are breastfeeding, be very cautious about drinking alcohol. If you are not pregnant and choose to drink alcohol, do not exceed 1 drink per day. One drink is considered to be 12 ounces (355 mL) of beer, 5 ounces (148 mL) of wine, or 1.5 ounces (44 mL) of liquor.  Avoid use of street drugs. Do not share needles with anyone. Ask  for help if you need support or instructions about stopping the use of drugs.  High blood pressure causes heart disease and increases the risk of stroke. Your blood pressure should be checked at least every 1 to 2 years. Ongoing high blood pressure should be treated with medicines if weight loss and exercise are not effective.  If you are 39 to 75 years old, ask your caregiver if you should take aspirin to prevent strokes.  Diabetes screening involves taking a blood sample to check your fasting blood sugar level. This should be done once every 3 years, after age 36, if you are within normal weight and without risk factors for diabetes. Testing should be considered at a younger age or be carried out more frequently if you are overweight and have at least 1 risk factor for diabetes.  Breast cancer screening is essential preventive care for women. You should practice "breast self-awareness." This means understanding the normal appearance and feel of your breasts and may include breast self-examination. Any changes detected, no matter how small, should be reported to a caregiver. Women in their 2s and 30s should have a clinical breast exam (CBE) by a caregiver as part of a regular health exam every 1 to 3 years. After age 9, women should have a CBE every year. Starting at age 79, women should consider having a mammography (breast X-ray test) every year. Women who have a family history of breast cancer should talk to their caregiver about genetic screening. Women at a high risk of breast cancer should talk to their caregivers about having magnetic resonance imaging (MRI) and a mammography every year.  Breast cancer gene (BRCA)-related cancer risk assessment is recommended for women who have family members with BRCA-related cancers. BRCA-related cancers include breast, ovarian, tubal, and peritoneal cancers. Having family members with these cancers may be associated with an increased risk for harmful changes  (mutations) in the breast cancer genes BRCA1 and BRCA2. Results of the assessment will determine the need for genetic counseling and BRCA1 and BRCA2 testing.  The Pap test is a screening test for cervical cancer. A Pap test can show cell changes on the cervix that might become cervical cancer if left untreated. A Pap test is a procedure in which cells are obtained and examined from the lower end of the uterus (cervix).  Women should have a Pap test starting at age 76.  Between ages 64 and 82, Pap tests should be repeated every 2 years.  Beginning at age 57, you should have a Pap test every 3 years as long as the past 3 Pap tests have been normal.  Some women have medical problems that increase the chance of getting cervical cancer. Talk to your caregiver about these problems. It is especially important to talk to your caregiver if a new problem develops soon after your last Pap test. In these cases, your caregiver may recommend more frequent screening and Pap tests.  The above recommendations are the same for women who  have or have not gotten the vaccine for human papillomavirus (HPV).  If you had a hysterectomy for a problem that was not cancer or a condition that could lead to cancer, then you no longer need Pap tests. Even if you no longer need a Pap test, a regular exam is a good idea to make sure no other problems are starting.  If you are between ages 83 and 14, and you have had normal Pap tests going back 10 years, you no longer need Pap tests. Even if you no longer need a Pap test, a regular exam is a good idea to make sure no other problems are starting.  If you have had past treatment for cervical cancer or a condition that could lead to cancer, you need Pap tests and screening for cancer for at least 20 years after your treatment.  If Pap tests have been discontinued, risk factors (such as a new sexual partner) need to be reassessed to determine if screening should be resumed.  The  HPV test is an additional test that may be used for cervical cancer screening. The HPV test looks for the virus that can cause the cell changes on the cervix. The cells collected during the Pap test can be tested for HPV. The HPV test could be used to screen women aged 10 years and older, and should be used in women of any age who have unclear Pap test results. After the age of 65, women should have HPV testing at the same frequency as a Pap test.  Colorectal cancer can be detected and often prevented. Most routine colorectal cancer screening begins at the age of 34 and continues through age 39. However, your caregiver may recommend screening at an earlier age if you have risk factors for colon cancer. On a yearly basis, your caregiver may provide home test kits to check for hidden blood in the stool. Use of a small camera at the end of a tube, to directly examine the colon (sigmoidoscopy or colonoscopy), can detect the earliest forms of colorectal cancer. Talk to your caregiver about this at age 70, when routine screening begins. Direct examination of the colon should be repeated every 5 to 10 years through age 41, unless early forms of pre-cancerous polyps or small growths are found.  Hepatitis C blood testing is recommended for all people born from [birth year] through 1967 and any individual with known risks for hepatitis C.  Practice safe sex. Use condoms and avoid high-risk sexual practices to reduce the spread of sexually transmitted infections (STIs). STIs include gonorrhea, chlamydia, syphilis, trichomonas, herpes, HPV, and human immunodeficiency virus (HIV). Herpes, HIV, and HPV are viral illnesses that have no cure. They can result in disability, cancer, and death. Sexually active women aged 68 and younger should be checked for chlamydia. Older women with new or multiple partners should also be tested for chlamydia. Testing for other STIs is recommended if you are sexually active and at increased  risk.  Osteoporosis is a disease in which the bones lose minerals and strength with aging. This can result in serious bone fractures. The risk of osteoporosis can be identified using a bone density scan. Women ages 49 and over and women at risk for fractures or osteoporosis should discuss screening with their caregivers. Ask your caregiver whether you should take a calcium supplement or vitamin D to reduce the rate of osteoporosis.  Menopause can be associated with physical symptoms and risks. Hormone replacement therapy is available to decrease  symptoms and risks. You should talk to your caregiver about whether hormone replacement therapy is right for you.  Use sunscreen. Apply sunscreen liberally and repeatedly throughout the day. You should seek shade when your shadow is shorter than you. Protect yourself by wearing long sleeves, pants, a wide-brimmed hat, and sunglasses year round, whenever you are outdoors.  Once a month, do a whole body skin exam, using a mirror to look at the skin on your back. Notify your caregiver of new moles, moles that have irregular borders, moles that are larger than a pencil eraser, or moles that have changed in shape or color.  Stay current with required immunizations.  Influenza vaccine. All adults should be immunized every year.  Tetanus, diphtheria, and acellular pertussis (Td, Tdap) vaccine. Pregnant women should receive 1 dose of Tdap vaccine during each pregnancy. The dose should be obtained regardless of the length of time since the last dose. Immunization is preferred during the 27th to 36th week of gestation. An adult who has not previously received Tdap or who does not know her vaccine status should receive 1 dose of Tdap. This initial dose should be followed by tetanus and diphtheria toxoids (Td) booster doses every 10 years. Adults with an unknown or incomplete history of completing a 3-dose immunization series with Td-containing vaccines should begin or  complete a primary immunization series including a Tdap dose. Adults should receive a Td booster every 10 years.  Varicella vaccine. An adult without evidence of immunity to varicella should receive 2 doses or a second dose if she has previously received 1 dose. Pregnant females who do not have evidence of immunity should receive the first dose after pregnancy. This first dose should be obtained before leaving the health care facility. The second dose should be obtained 4 8 weeks after the first dose.  Human papillomavirus (HPV) vaccine. Females aged 49 26 years who have not received the vaccine previously should obtain the 3-dose series. The vaccine is not recommended for use in pregnant females. However, pregnancy testing is not needed before receiving a dose. If a female is found to be pregnant after receiving a dose, no treatment is needed. In that case, the remaining doses should be delayed until after the pregnancy. Immunization is recommended for any person with an immunocompromised condition through the age of 71 years if she did not get any or all doses earlier. During the 3-dose series, the second dose should be obtained 4 8 weeks after the first dose. The third dose should be obtained 24 weeks after the first dose and 16 weeks after the second dose.  Zoster vaccine. One dose is recommended for adults aged 64 years or older unless certain conditions are present.  Measles, mumps, and rubella (MMR) vaccine. Adults born before [birth year] generally are considered immune to measles and mumps. Adults born in [birth year] or later should have 1 or more doses of MMR vaccine unless there is a contraindication to the vaccine or there is laboratory evidence of immunity to each of the three diseases. A routine second dose of MMR vaccine should be obtained at least 28 days after the first dose for students attending postsecondary schools, health care workers, or international travelers. People who received inactivated  measles vaccine or an unknown type of measles vaccine during 1963 1967 should receive 2 doses of MMR vaccine. People who received inactivated mumps vaccine or an unknown type of mumps vaccine before 1979 and are at high risk for mumps infection should consider immunization  with 2 doses of MMR vaccine. For females of childbearing age, rubella immunity should be determined. If there is no evidence of immunity, females who are not pregnant should be vaccinated. If there is no evidence of immunity, females who are pregnant should delay immunization until after pregnancy. Unvaccinated health care workers born before [birth year] who lack laboratory evidence of measles, mumps, or rubella immunity or laboratory confirmation of disease should consider measles and mumps immunization with 2 doses of MMR vaccine or rubella immunization with 1 dose of MMR vaccine.  Pneumococcal 13-valent conjugate (PCV13) vaccine. When indicated, a person who is uncertain of her immunization history and has no record of immunization should receive the PCV13 vaccine. An adult aged 63 years or older who has certain medical conditions and has not been previously immunized should receive 1 dose of PCV13 vaccine. This PCV13 should be followed with a dose of pneumococcal polysaccharide (PPSV23) vaccine. The PPSV23 vaccine dose should be obtained at least 8 weeks after the dose of PCV13 vaccine. An adult aged 37 years or older who has certain medical conditions and previously received 1 or more doses of PPSV23 vaccine should receive 1 dose of PCV13. The PCV13 vaccine dose should be obtained 1 or more years after the last PPSV23 vaccine dose.  Pneumococcal polysaccharide (PPSV23) vaccine. When PCV13 is also indicated, PCV13 should be obtained first. All adults aged 11 years and older should be immunized. An adult younger than age 52 years who has certain medical conditions should be immunized. Any person who resides in a nursing home or long-term care  facility should be immunized. An adult smoker should be immunized. People with an immunocompromised condition and certain other conditions should receive both PCV13 and PPSV23 vaccines. People with human immunodeficiency virus (HIV) infection should be immunized as soon as possible after diagnosis. Immunization during chemotherapy or radiation therapy should be avoided. Routine use of PPSV23 vaccine is not recommended for American Indians, [place] Natives, or people younger than 65 years unless there are medical conditions that require PPSV23 vaccine. When indicated, people who have unknown immunization and have no record of immunization should receive PPSV23 vaccine. One-time revaccination 5 years after the first dose of PPSV23 is recommended for people aged 59 64 years who have chronic kidney failure, nephrotic syndrome, asplenia, or immunocompromised conditions. People who received 1 2 doses of PPSV23 before age 26 years should receive another dose of PPSV23 vaccine at age 80 years or later if at least 5 years have passed since the previous dose. Doses of PPSV23 are not needed for people immunized with PPSV23 at or after age 26 years.  Meningococcal vaccine. Adults with asplenia or persistent complement component deficiencies should receive 2 doses of quadrivalent meningococcal conjugate (MenACWY-D) vaccine. The doses should be obtained at least 2 months apart. Microbiologists working with certain meningococcal bacteria, [place] recruits, people at risk during an outbreak, and people who travel to or live in countries with a high rate of meningitis should be immunized. A first-year college student up through age 85 years who is living in a residence hall should receive a dose if she did not receive a dose on or after her 16th birthday. Adults who have certain high-risk conditions should receive one or more doses of vaccine.  Hepatitis A vaccine. Adults who wish to be protected from this disease, have certain  high-risk conditions, work with hepatitis A-infected animals, work in hepatitis A research labs, or travel to or work in countries with a high rate of  hepatitis A should be immunized. Adults who were previously unvaccinated and who anticipate close contact with an international adoptee during the first 60 days after arrival in the [place] from a country with a high rate of hepatitis A should be immunized.  Hepatitis B vaccine. Adults who wish to be protected from this disease, have certain high-risk conditions, may be exposed to blood or other infectious body fluids, are household contacts or sex partners of hepatitis B positive people, are clients or workers in certain care facilities, or travel to or work in countries with a high rate of hepatitis B should be immunized.  Haemophilus influenzae type b (Hib) vaccine. A previously unvaccinated person with asplenia or sickle cell disease or having a scheduled splenectomy should receive 1 dose of Hib vaccine. Regardless of previous immunization, a recipient of a hematopoietic stem cell transplant should receive a 3-dose series 6 12 months after her successful transplant. Hib vaccine is not recommended for adults with HIV infection.

## 2013-04-06 ENCOUNTER — Encounter: Payer: Self-pay | Admitting: Internal Medicine

## 2013-06-06 DIAGNOSIS — H04129 Dry eye syndrome of unspecified lacrimal gland: Secondary | ICD-10-CM | POA: Diagnosis not present

## 2013-06-06 DIAGNOSIS — H43399 Other vitreous opacities, unspecified eye: Secondary | ICD-10-CM | POA: Diagnosis not present

## 2013-06-06 DIAGNOSIS — Z961 Presence of intraocular lens: Secondary | ICD-10-CM | POA: Diagnosis not present

## 2013-06-11 IMAGING — CR DG SHOULDER 1V*L*
1 series · 1 of 1 positions shown · non-contrast
Comparison: 11/11/2010, 10/15/2010

CLINICAL DATA: Postop procedure.  Left shoulder repair following a
fall.

PORTABLE LEFT SHOULDER - 2+ VIEW

[AP]
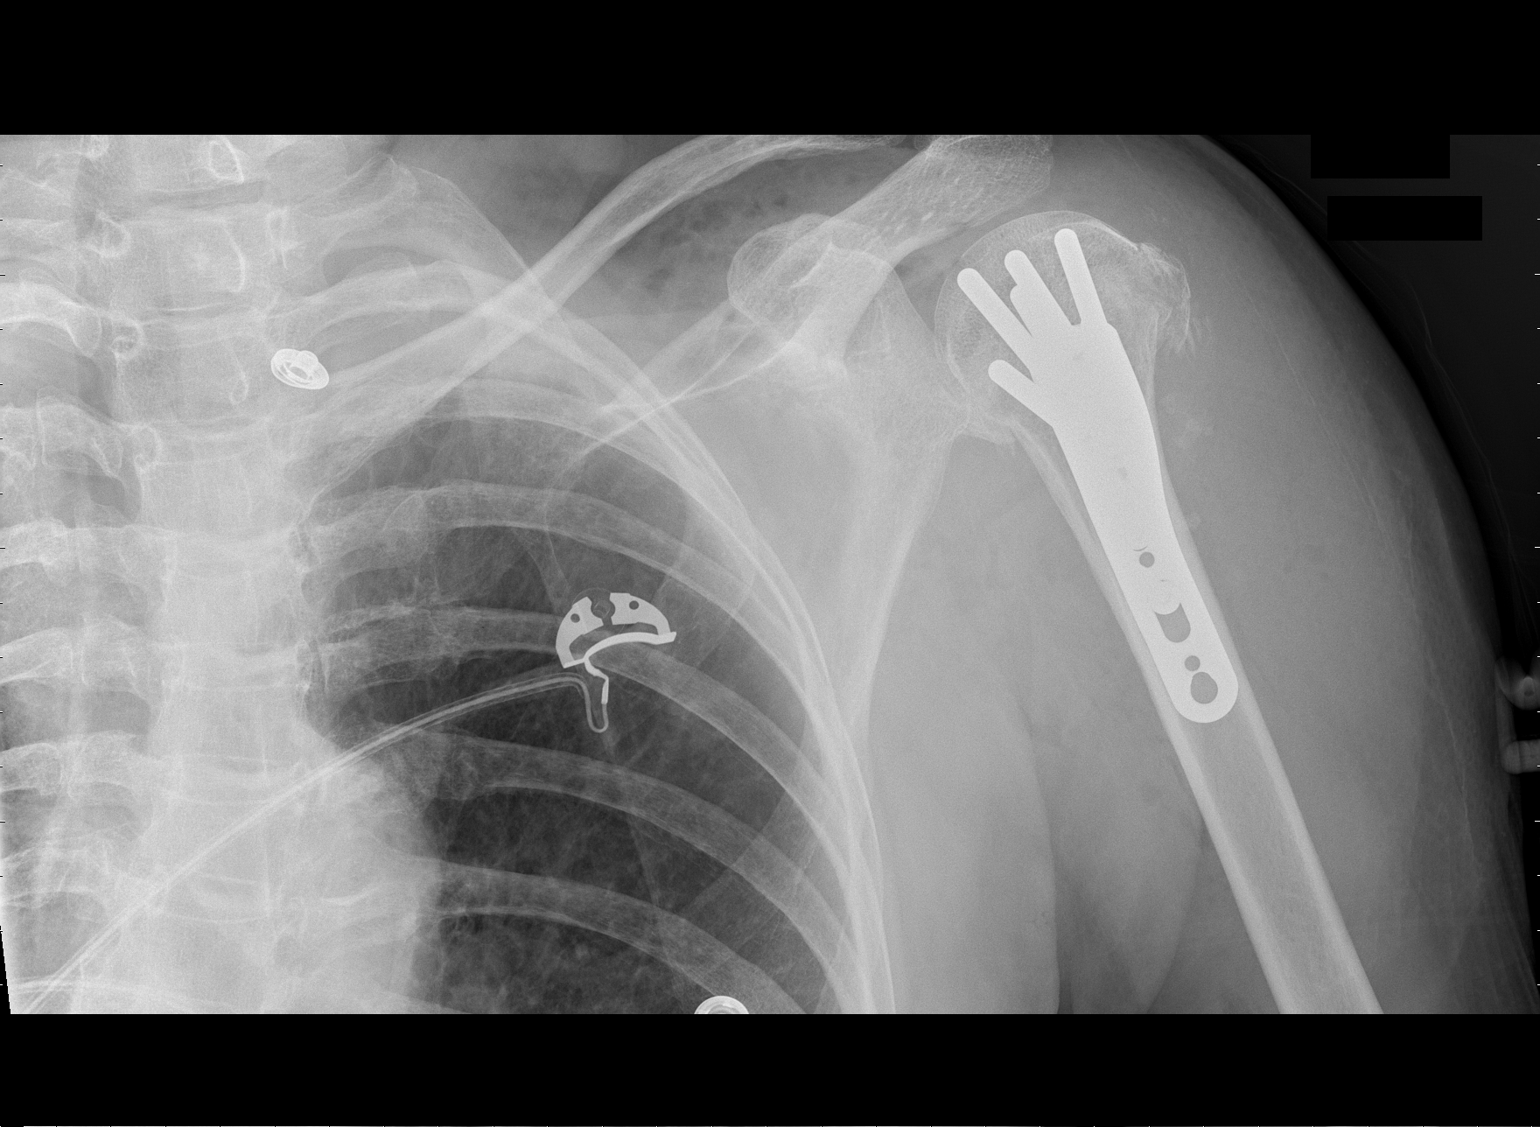

[1 of 1 positions shown; findings below may reference images not displayed]

FINDINGS: The patient is status post ORIF of the left shoulder.
Screw plate is identified in frontal projection.  Alignment is near
anatomic.  No evidence for dislocation.
IMPRESSION: Status post ORIF of the left shoulder.

## 2013-06-11 IMAGING — RF DG HUMERUS 2V *L*
1 series · 2 of 2 positions shown · non-contrast
Comparison: 10/15/2010 CT [HOSPITAL].

CLINICAL DATA: Left humerus fracture.

LEFT HUMERUS - 2+ VIEW

[Series 1: run · 2 of 2 slices shown]
[im 1/2]
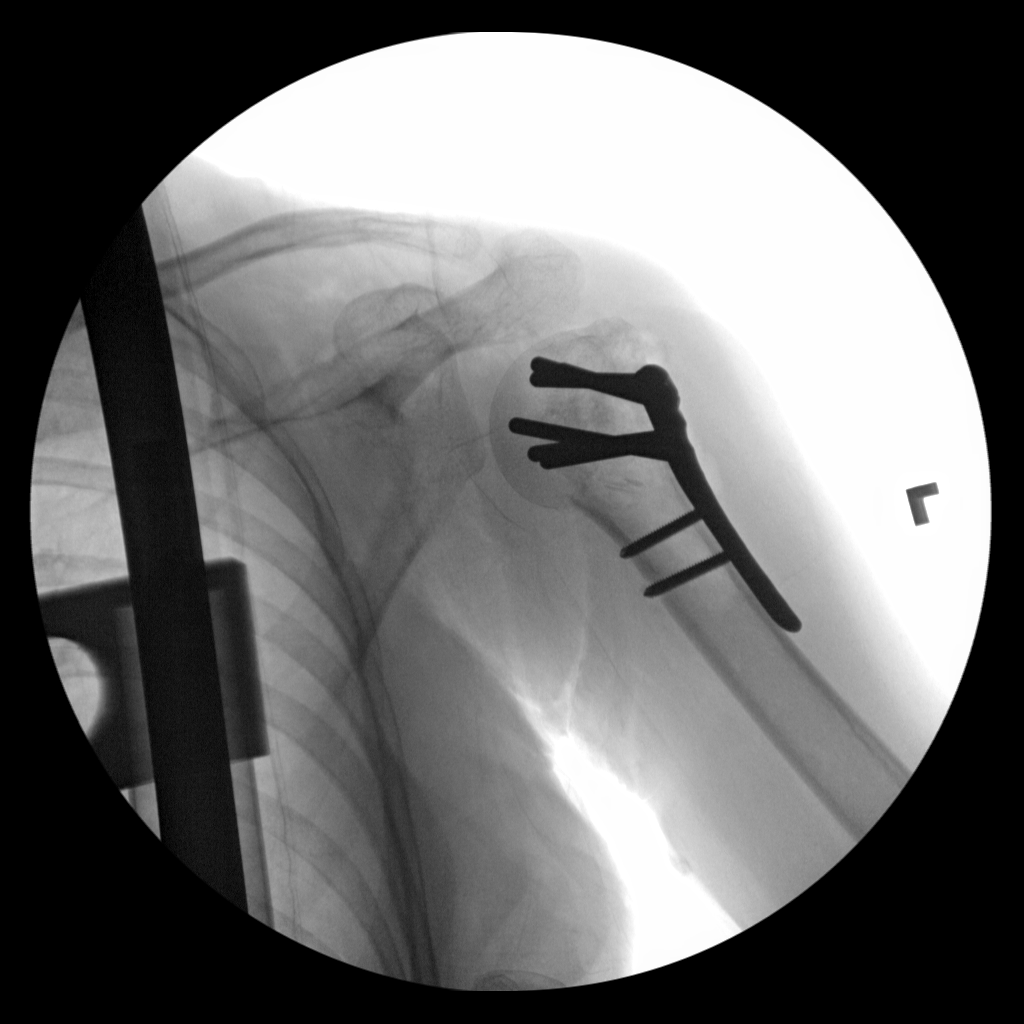
[im 2/2]
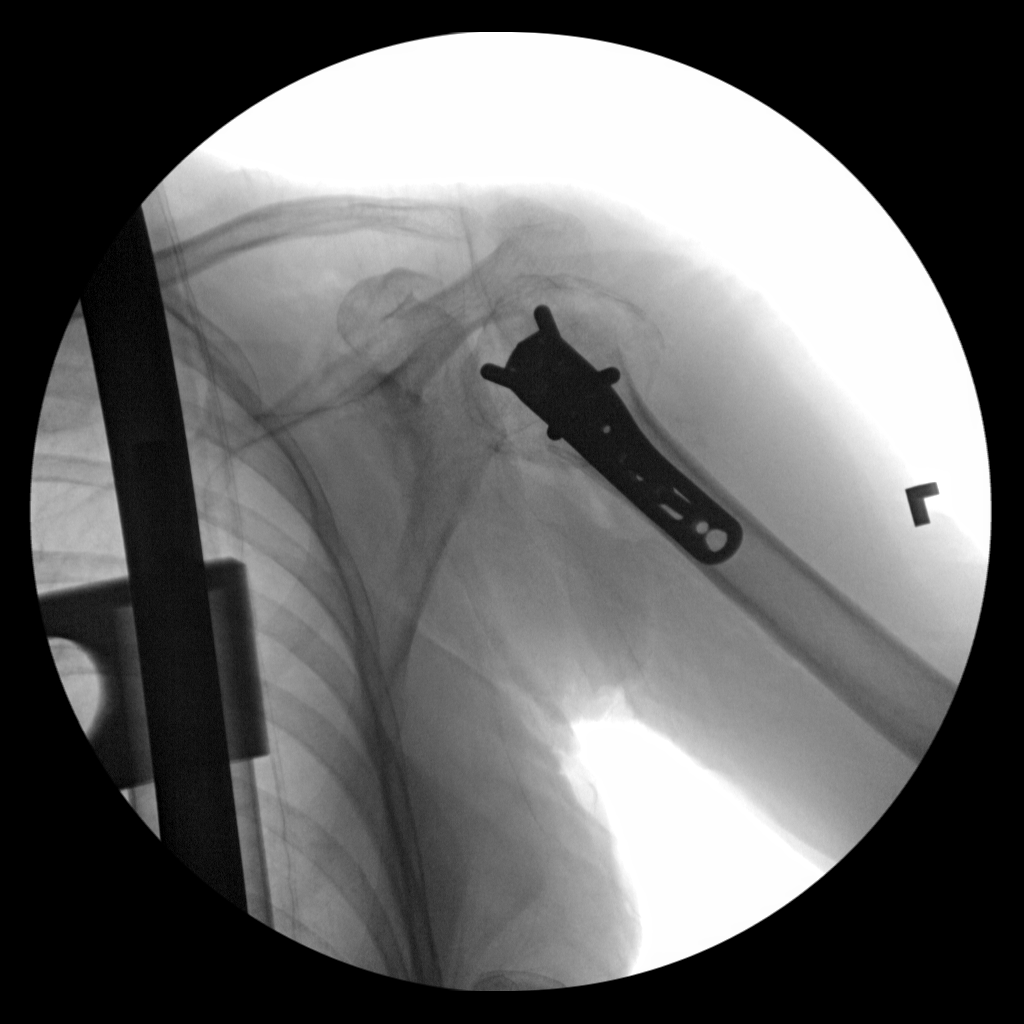

[2 of 2 positions shown; findings below may reference images not displayed]

FINDINGS: Two-view intraoperative C-arm views submitted for review
after surgery.  This reveals transfixion of the left proximal
humerus fracture with side plate and screws with fracture fragments
appearing in satisfactory position.  The left humeral head appears
slightly inferiorly subluxed.
IMPRESSION: Open reduction and internal fixation of proximal left humerus
fracture.

## 2013-06-11 IMAGING — CR DG CHEST 2V
2 series · 2 of 2 positions shown · non-contrast
Comparison: None.

CLINICAL DATA: Pre-op respiratory exam.  For left humeral neck
fracture.

CHEST - 2 VIEW

[view not recorded (1 of 2)]
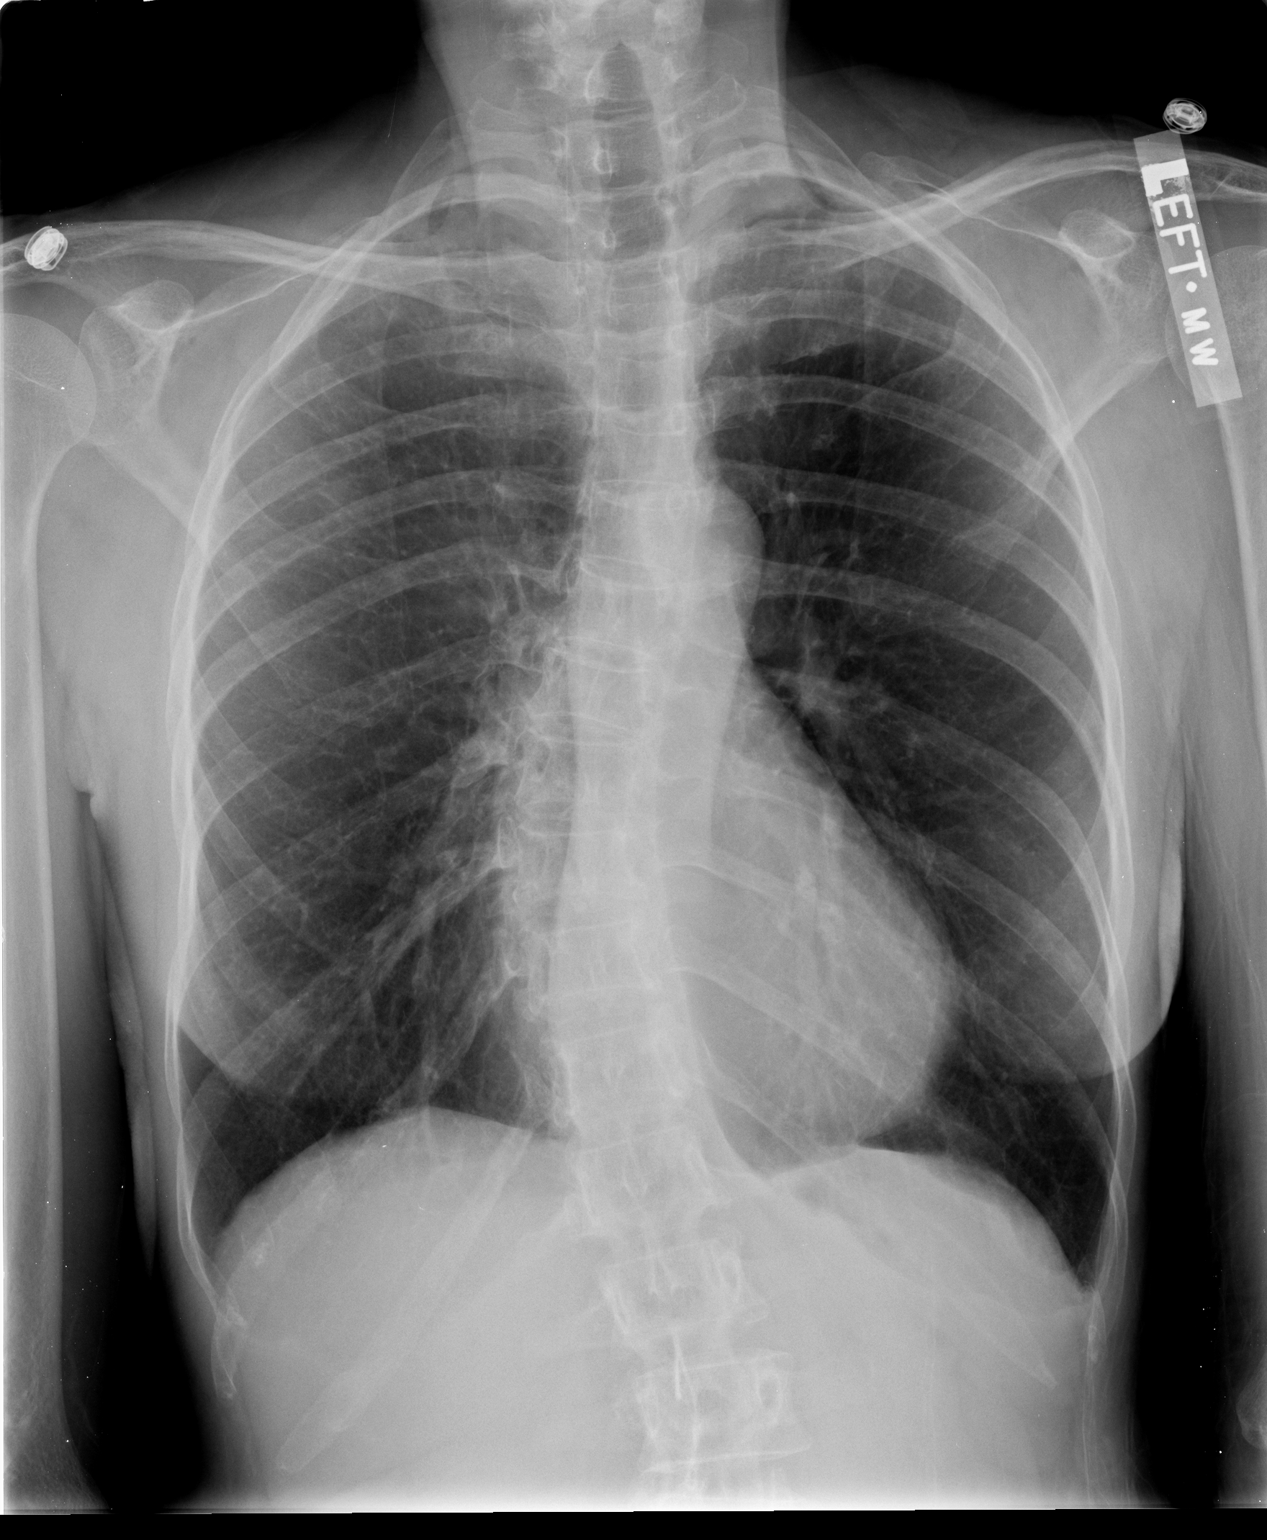

[view not recorded (2 of 2)]
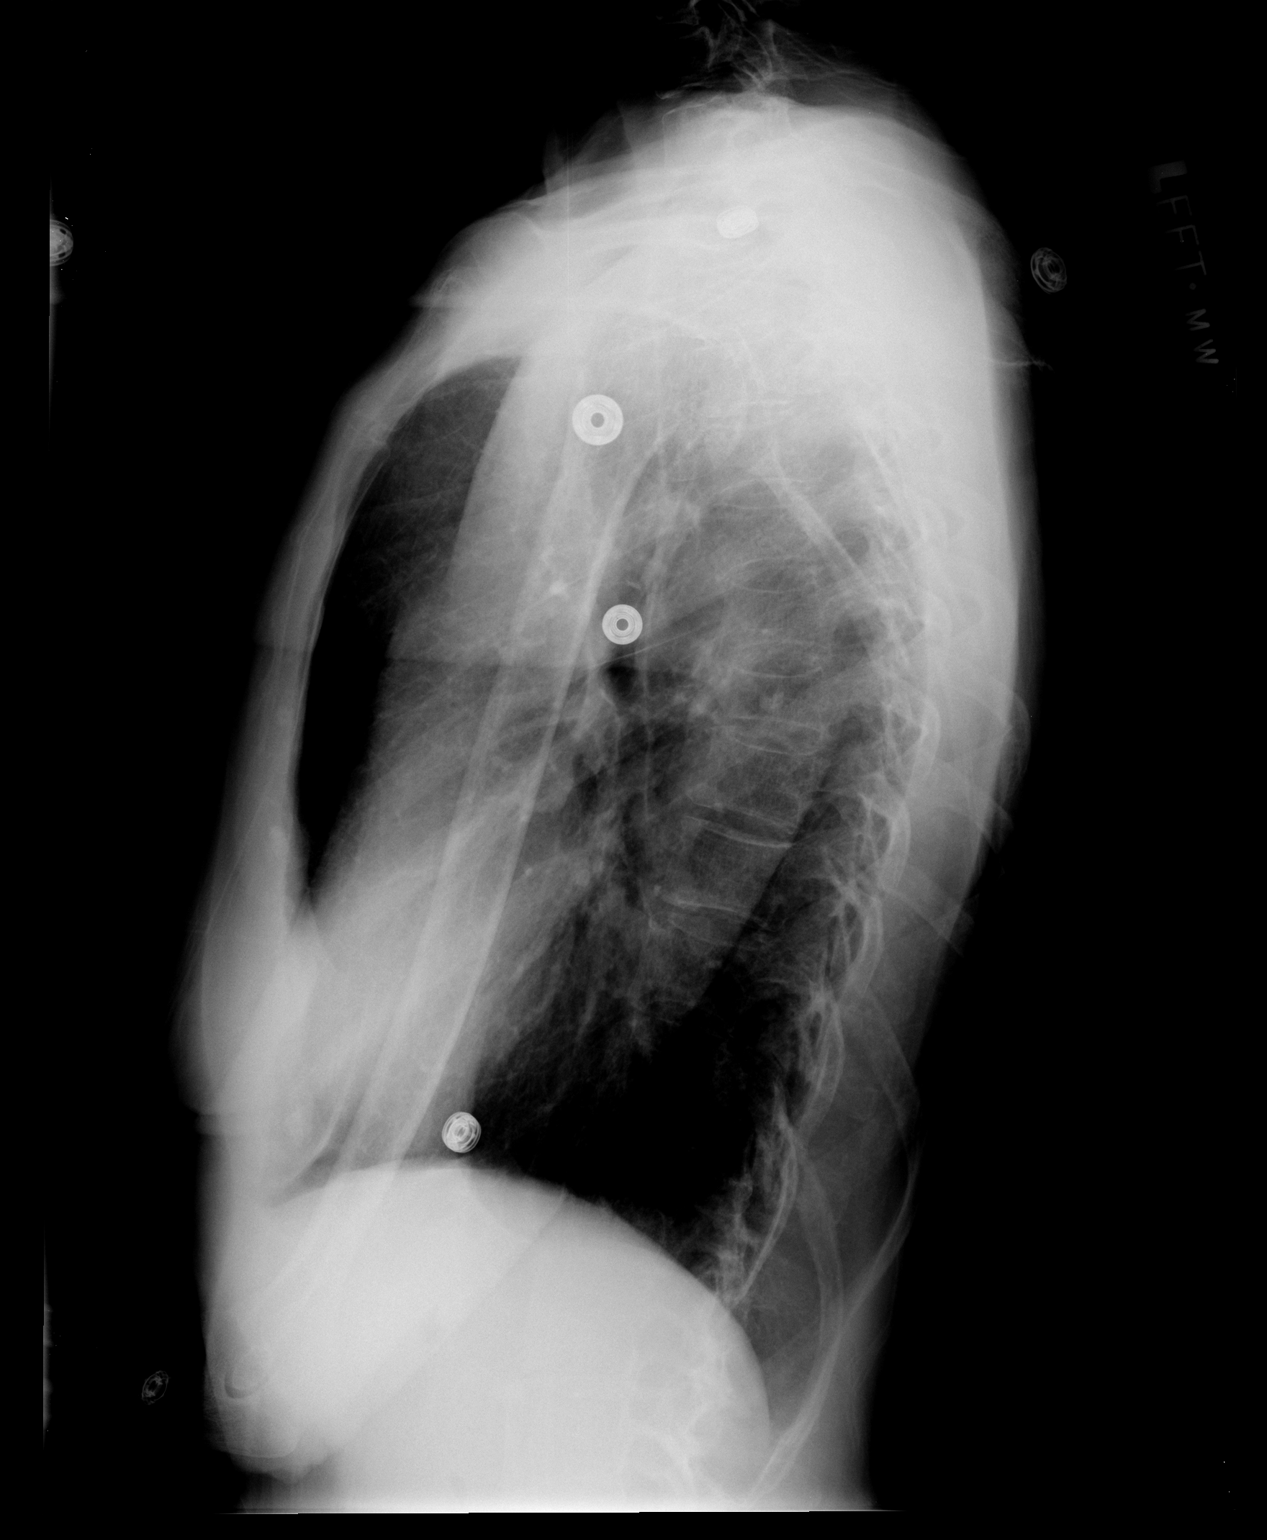

[2 of 2 positions shown; findings below may reference images not displayed]

FINDINGS: Pulmonary hyperinflation seen, consistent with COPD.  No
evidence of pulmonary infiltrate or edema.  No evidence of pleural
effusion.  No mass or lymphadenopathy identified.  Heart size is
normal.  Mild thoracic dextroscoliosis is noted.
IMPRESSION: COPD.  No active disease.

## 2013-06-13 IMAGING — CR DG PELVIS 1-2V
1 series · 1 of 1 positions shown · non-contrast
Comparison: None

CLINICAL DATA: Fall.  Pelvic pain.

PELVIS - 1-2 VIEW

[t pelvis a.p.]
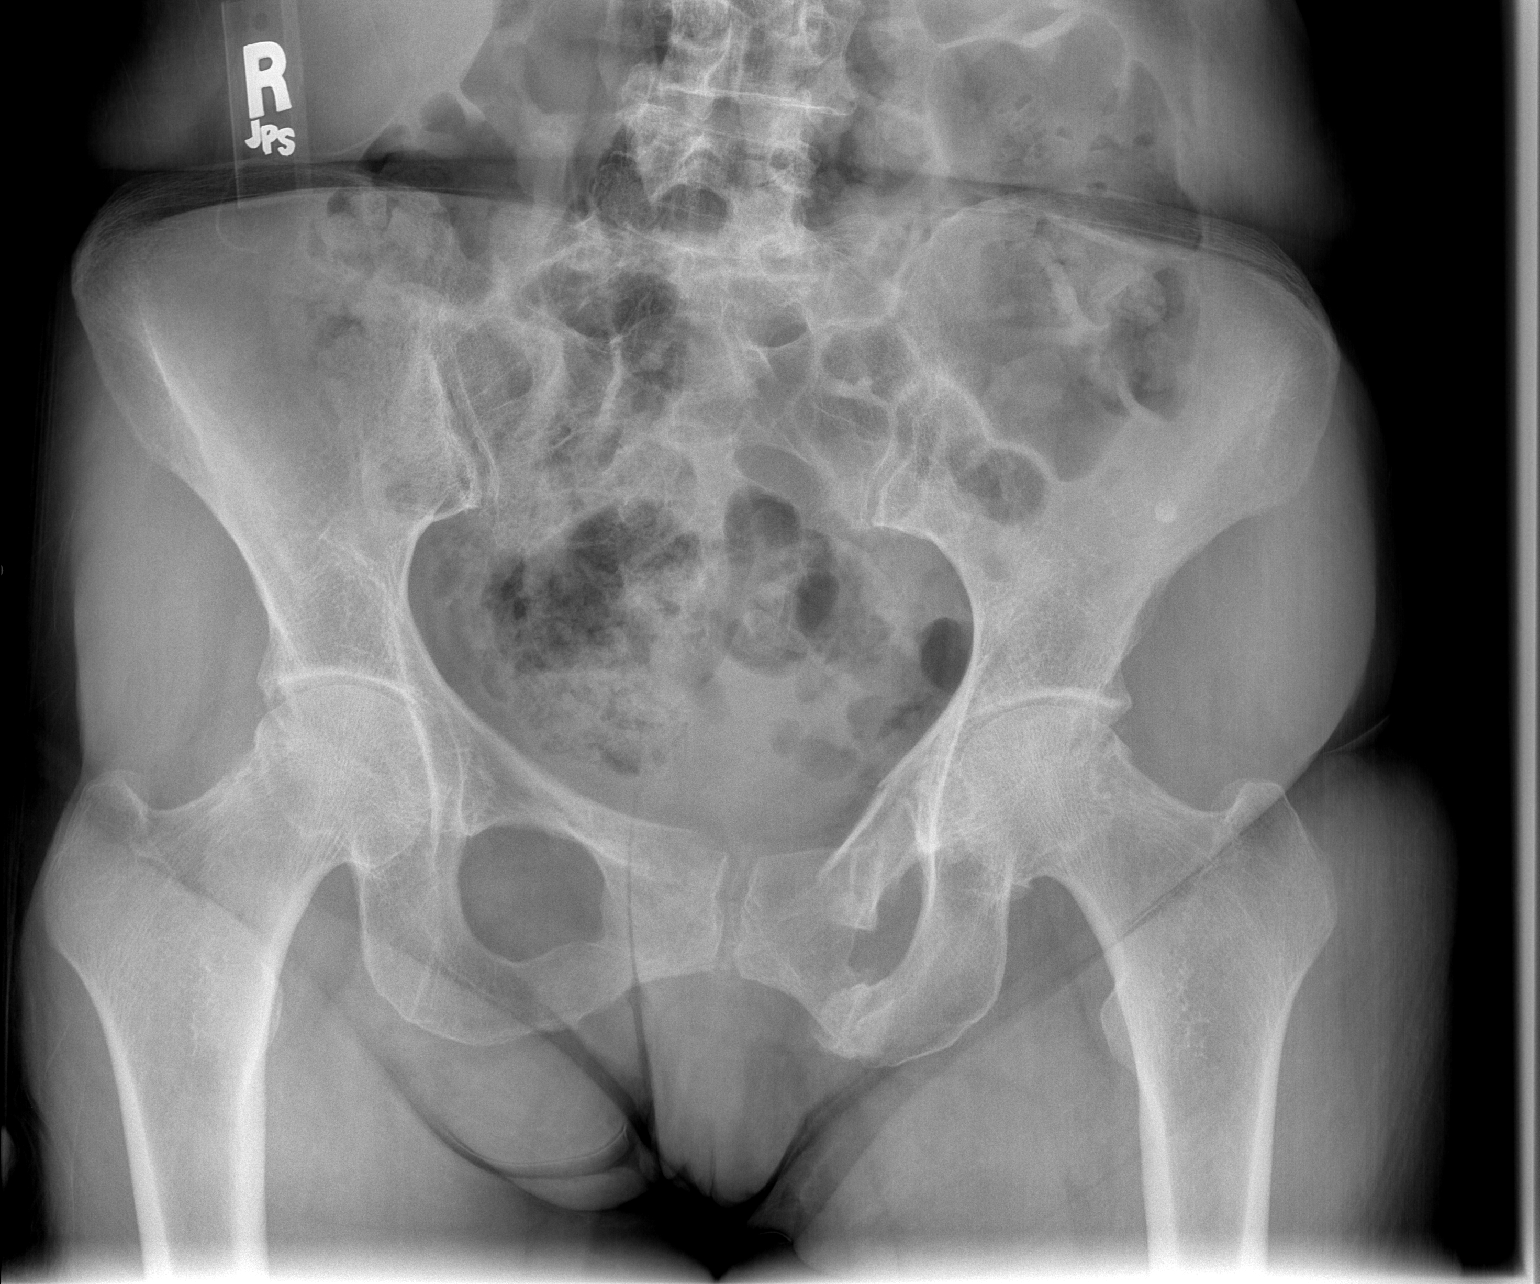

[1 of 1 positions shown; findings below may reference images not displayed]

FINDINGS: Fractures seen involving the left superior and inferior
pubic rami.  No other pelvic fractures identified.  No evidence of
pelvic joint diastasis.
IMPRESSION: Fractures of the left superior and inferior pubic rami.

## 2013-06-16 ENCOUNTER — Telehealth: Payer: Self-pay | Admitting: *Deleted

## 2013-06-16 NOTE — Telephone Encounter (Signed)
Called pt and lvm advising her that it is time to get her next Prolia injection. Advised her that it was approved and there is a $147 deductible. Advised pt the injection is to be given on 4/24 (or after). Asked her to call me back and let me know if she wants to proceed.

## 2013-06-29 ENCOUNTER — Ambulatory Visit: Payer: Medicare Other

## 2013-06-30 ENCOUNTER — Ambulatory Visit (INDEPENDENT_AMBULATORY_CARE_PROVIDER_SITE_OTHER): Payer: Medicare Other | Admitting: *Deleted

## 2013-06-30 DIAGNOSIS — M810 Age-related osteoporosis without current pathological fracture: Secondary | ICD-10-CM | POA: Diagnosis not present

## 2013-06-30 MED ORDER — DENOSUMAB 60 MG/ML ~~LOC~~ SOLN
60.0000 mg | Freq: Once | SUBCUTANEOUS | Status: AC
  Administered 2013-06-30: 60 mg via SUBCUTANEOUS

## 2013-08-02 DIAGNOSIS — M48061 Spinal stenosis, lumbar region without neurogenic claudication: Secondary | ICD-10-CM | POA: Diagnosis not present

## 2013-09-11 ENCOUNTER — Encounter: Payer: Self-pay | Admitting: Internal Medicine

## 2013-09-11 ENCOUNTER — Ambulatory Visit (INDEPENDENT_AMBULATORY_CARE_PROVIDER_SITE_OTHER): Payer: Medicare Other | Admitting: Internal Medicine

## 2013-09-11 VITALS — BP 114/74 | HR 72 | Temp 98.3°F | Ht 64.0 in | Wt 116.0 lb

## 2013-09-11 DIAGNOSIS — G4700 Insomnia, unspecified: Secondary | ICD-10-CM | POA: Diagnosis not present

## 2013-09-11 DIAGNOSIS — R002 Palpitations: Secondary | ICD-10-CM

## 2013-09-11 DIAGNOSIS — Z79899 Other long term (current) drug therapy: Secondary | ICD-10-CM | POA: Diagnosis not present

## 2013-09-11 MED ORDER — CLONAZEPAM 0.5 MG PO TABS: ORAL_TABLET | ORAL | Status: DC

## 2013-09-11 NOTE — Progress Notes (Signed)
Pre visit review using our clinic review tool, if applicable. No additional management support is needed unless otherwise documented below in the visit note.  Chief Complaint  Patient presents with  . Follow-up    HPI: Here for fu of medical issues  Since last visit  Palpitations. For no reason.   2-3 months   3-4 x per week and last  10 minutes racing  Episodes  Sudden  Onset and stop.   Not during exercise  No ass [place]  Hx of same in past and had full eval   In the past   had eva event monitor and ? 2010  No  Dx  And no hx of af   Mom had cva from rheumatic disease    Sleep and risk meds : 1/2 clonipen at night  And amitriptyline  And doing ok.  Very careful getting out of bed.     Exercise hour walking 5-6 times per week .  And rowing and walk. No chang ein exercise tolerance  ROS: See pertinent positives and negatives per HPI.no cough syncope cp sob   Past Medical History  Diagnosis Date  . Arthritis   . Cataract     lens implant  . Osteoporosis     eval dr Buddy Duty in past dexa 11 12   . Kidney stones 1967  . Colon polyps   . Closed pelvic fracture 2012    pelvis and shoulder   . Hx of fall     fracture pelvis and left shoulder humerus  . Hx of nonmelanoma skin cancer     ssca  . MVP (mitral valve prolapse)     in records  no echo eval in records  . Hx of syncope     had neg event monitor per dr Marya Amsler taylor in 2010  . 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 CVA Mother     Died in her 69C complications rheumatic  fever afterwards when she got a stroke  . Lung cancer Sister     Died in 51s was a smoker  . Rheumatic fever Mother   . Thyroid cancer Daughter     Papillary    History   Social History  . Marital Status: Married    Spouse Name: N/A    Number of Children: N/A  . Years of Education: N/A   Social History Main Topics  . Smoking status: Never Smoker   . Smokeless tobacco: None  . Alcohol Use:  1.5 oz/week    3 drink(s) per week  . Drug Use: No  . Sexual Activity: Yes    Partners: Female   Other Topics Concern  . 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      g4 P4      Regular exercise: walks daily   Caffeine use: 2 cups of coffee daily          Outpatient Encounter Prescriptions as of 09/11/2013  Medication Sig  . amitriptyline (ELAVIL) 10 MG tablet TAKE 1 TABLET BY MOUTH EVERY NIGHT AT BEDTIME  . Calcium Carbonate-Vitamin D (CALCIUM-VITAMIN D) 500-200 MG-UNIT per tablet Take 1 tablet by mouth 2 (two) times daily with  a meal.    . clonazePAM (KLONOPIN) 0.5 MG tablet TAKE 1 TABLET BY MOUTH AT BEDTIME AS NEEDED  . conjugated estrogens (PREMARIN) vaginal cream Use 3 x per week  Vaginally as directed  . cycloSPORINE (RESTASIS) 0.05 % ophthalmic emulsion Place 1 drop into both eyes 2 (two) times daily.  Marland Kitchen glucosamine-chondroitin 500-400 MG tablet Take 1 tablet by mouth daily.    . methocarbamol (ROBAXIN) 500 MG tablet Take 500 mg by mouth. Using about 3 times weekly.  . Multiple Vitamins-Minerals (MULTIVITAL) tablet Take 1 tablet by mouth daily.    Marland Kitchen tretinoin (RETIN-A) 0.025 % cream   . VITAMIN D, ERGOCALCIFEROL, PO Take by mouth.    . [DISCONTINUED] clonazePAM (KLONOPIN) 0.5 MG tablet TAKE 1 TABLET BY MOUTH AT BEDTIME AS NEEDED  . [DISCONTINUED] clonazePAM (KLONOPIN) 0.5 MG tablet TAKE 1 TABLET BY MOUTH AT BEDTIME AS NEEDED    EXAM:  BP 114/74  Pulse 72  Temp(Src) 98.3 F (36.8 C) (Oral)  Ht 5\' 4"  (1.626 m)  Wt 116 lb (52.617 kg)  BMI 19.90 kg/m2  SpO2 98%  Body mass index is 19.9 kg/(m^2). Looks well  GENERAL: vitals reviewed and listed above, alert, oriented, appears well hydrated and in no acute distress well groomed looks well  HEENT: atraumatic, conjunctiva   clear, no obvious abnormalities on inspection of external nose and ears  NECK: no obvious masses on inspection palpation  LUNGS: clear to auscultation bilaterally, no wheezes, rales or rhonchi, good air movement CV: HRRR, no clubbing cyanosis or  peripheral edema nl cap refill  Abdomen:  Sof,t normal bowel sounds without hepatosplenomegaly, no guarding rebound or masses no CVA tenderness Skin mult SKs MS: moves all extremities without noticeable focal  Abnormality some OA changes  PSYCH: pleasant and cooperative,  Lab Results  Component Value Date   WBC 5.6 02/26/2012   HGB 14.5 02/26/2012   HCT 42.8 02/26/2012   PLT 162.0 02/26/2012   GLUCOSE 88 03/14/2013   CHOL 216* 03/14/2013   TRIG 120.0 03/14/2013   HDL 61.00 03/14/2013   LDLDIRECT 136.8 03/14/2013   ALT 19 02/26/2012   AST 24 02/26/2012   NA 137 03/14/2013   K 4.5 03/14/2013   CL 101 03/14/2013   CREATININE 0.8 03/14/2013   BUN 25* 03/14/2013   CO2 29 03/14/2013   TSH 0.43 03/14/2013   INR 0.98 11/11/2010    ASSESSMENT AND PLAN:  Discussed the following assessment and plan:  Insomnia - ongoing sleep with lonipen and tca for years  feels well  benefot more than risk at this time   High risk medication use  Palpitations - hx of same in past  recently again but shor lasting 10 minutes and good exercise tolerance and seems regular  rate per pat more like a tachy  Pt to contact us for fu  And reeval if needed for    persistent or progressive or alarm sx .  -Patient advised to return or notify health care team  if symptoms worsen ,persist or new concerns arise.  Patient Instructions  If palpitations  Increase  And continue  Then contact us for fu visit.  Consider another event monitor  To define  Problem but since your exercise is good  don't think you have any problem with heart muscle function.  Ok to continue on same meds for now knowing risk because you are doing well.    Standley Brooking. Panosh M.D.

## 2013-09-11 NOTE — Patient Instructions (Signed)
If palpitations  Increase  And continue  Then contact us for fu visit.  Consider another event monitor  To define  Problem but since your exercise is good  don't think you have any problem with heart muscle function.  Ok to continue on same meds for now knowing risk because you are doing well.

## 2013-09-22 ENCOUNTER — Encounter: Payer: Self-pay | Admitting: Internal Medicine

## 2013-10-12 ENCOUNTER — Ambulatory Visit (INDEPENDENT_AMBULATORY_CARE_PROVIDER_SITE_OTHER): Payer: Medicare Other | Admitting: Internal Medicine

## 2013-10-12 ENCOUNTER — Encounter: Payer: Self-pay | Admitting: Internal Medicine

## 2013-10-12 VITALS — BP 102/64 | HR 76 | Temp 97.8°F | Resp 12 | Wt 115.8 lb

## 2013-10-12 DIAGNOSIS — M810 Age-related osteoporosis without current pathological fracture: Secondary | ICD-10-CM | POA: Diagnosis not present

## 2013-10-12 NOTE — Patient Instructions (Signed)
Please come back for labs at the beginning of October. You can have the next Prolia injection after 12/30/2013.  Please return in 1 year.

## 2013-10-12 NOTE — Progress Notes (Signed)
Patient ID: JASPER RUMINSKI, female   DOB: Jan 18, 1941, 73 y.o.   MRN: [id number]   HPI  Jacqueline Burgess is a 73 y.o.-year-old female, returning for f/u for osteoporosis, dx 2000. Last visit 1 year ago.  Since last visit, she had 2 Prolia injections, which she tolerated well. No fx's in the interim. No pain in legs or jaw. No skin infections.  She continues to walk daily.  Reviewed hx - and annotated it: DEXA scans:  Date L1-L2 T score Hip - L&R T score 33% distal Radius T score UD radius  02/05/2011 - [place] Deg. changes, severe scoliosis: -3.1 LFN: - 1.2 RFN: -1.7 -3.5  -4.6   For the 2010 DEXA,  I only have the reading, not the actual images + report: Date L1-L2 T score Total R hip % change 33% distal Radius T score  01/25/2009 n/a -2.4% (sig)  ! Not T score -3.2    I reviewed patient's previous imaging results: In 02/06/2000: Xray: R great toe comminuted non-displaced fx - prox. [place] - desk fell on toe In 02/11/2003: bone scan: probable L T8 rib fx  Pt had a closed L pelvic (sup and inf pubic rami) fx and a shoulder fx in 2012 - seen on Xray from 11/13/2010, after a fall from level ground (orthostasis).   She denies any recent falls. No dizziness/vertigo/orthostasis.  No h/o hyper/hypocalcemia. No h/o hyperparathyroidism. Has a remote h/o kidney stones in 1964 (pregnancy related).  Lab Results  Component Value Date   PTH 28.7 02/26/2012   CALCIUM 9.1 03/14/2013   CALCIUM 9.1 02/26/2012   CALCIUM 9.6 02/26/2012   CALCIUM 8.2* 11/13/2010   CALCIUM 7.8* 11/12/2010   CALCIUM 9.5 11/11/2010   No h/o thyrotoxicosis. Reviewed TSH recent levels:  Lab Results  Component Value Date   TSH 0.43 03/14/2013   TSH 0.89 02/26/2012   No h/o vitamin D deficiency. Last vit D level was: 77 in 2013.  No h/o CKD. Last BUN/Cr: Lab Results  Component Value Date   BUN 25* 03/14/2013   CREATININE 0.8 03/14/2013   Pt is on calcium citrate - D 500-? Once a day at night and a vitamin D 2000 IU in  am. She takes a MVI. She has a previous history of vitamin D deficiency, per review of her chart, and was on ergocalciferol in the past.   Power - walks 45 min a day.   She has been on the following OP treatments:  Fosamax - total of 3-4 years. Stopped when Bisphosphonates got negative publicity, ~78 years ago.   She still has palpitations >> had an event monitor 2001 >> normal.  ROS: Constitutional: no weight gain/loss, no fatigue, no subjective hyperthermia/hypothermia Eyes: no blurry vision, no xerophthalmia ENT: no sore throat, no nodules palpated in throat, no dysphagia/odynophagia, no hoarseness Cardiovascular: no CP/SOB/+ palpitations/no leg swelling Respiratory: no cough/SOB Gastrointestinal: no N/V/D/C Musculoskeletal: no muscle/joint aches Skin: no rashes; + hair loss Neurological: no tremors/numbness/tingling/dizziness  PE: BP 102/64  Pulse 76  Temp(Src) 97.8 F (36.6 C) (Oral)  Resp 12  Wt 115 lb 12.8 oz (52.527 kg)  SpO2 95% Wt Readings from Last 3 Encounters:  10/12/13 115 lb 12.8 oz (52.527 kg)  09/11/13 116 lb (52.617 kg)  03/14/13 113 lb (51.256 kg)   Constitutional: very thin, in NAD. No kyphosis. Eyes: PERRLA, EOMI, no exophthalmos ENT: moist mucous membranes, no thyromegaly, no cervical lymphadenopathy Cardiovascular: RRR, No MRG Respiratory: CTA B Gastrointestinal: abdomen soft, NT, ND, BS+  Musculoskeletal: no deformities, strength intact in all 4 Skin: moist, warm, no rashes Neurological: no tremor with outstretched hands, DTR normal in all 4  Assessment: 1. Osteoporosis - h/o 3-4 years of po Bisph 10 years ago - h/o fxs  Plan: 1. Osteoporosis - reviewed previous labs and DEXA scans - pt had 2 doses of Prolia and she did not have any SEs. She also did not have a fracture after starting Prolia. - we discussed about obtaining a DEXA scan 2 years after starting Prolia since checking one now will not change our management.  - we discussed about  Teriparatide and she is not eager to try this (we may need t if Prolia not working well) - continue current vitamin D doses - will check a vit D level in 2 mo when she comes back for a BMP before next Prolia inj - I will see her back in 1 year

## 2013-10-13 ENCOUNTER — Other Ambulatory Visit: Payer: Self-pay | Admitting: Dermatology

## 2013-10-13 DIAGNOSIS — D235 Other benign neoplasm of skin of trunk: Secondary | ICD-10-CM | POA: Diagnosis not present

## 2013-10-13 DIAGNOSIS — Z85828 Personal history of other malignant neoplasm of skin: Secondary | ICD-10-CM | POA: Diagnosis not present

## 2013-10-13 DIAGNOSIS — L820 Inflamed seborrheic keratosis: Secondary | ICD-10-CM | POA: Diagnosis not present

## 2013-10-13 DIAGNOSIS — L570 Actinic keratosis: Secondary | ICD-10-CM | POA: Diagnosis not present

## 2013-10-13 DIAGNOSIS — D485 Neoplasm of uncertain behavior of skin: Secondary | ICD-10-CM | POA: Diagnosis not present

## 2013-10-13 DIAGNOSIS — L821 Other seborrheic keratosis: Secondary | ICD-10-CM | POA: Diagnosis not present

## 2013-10-13 DIAGNOSIS — L819 Disorder of pigmentation, unspecified: Secondary | ICD-10-CM | POA: Diagnosis not present

## 2013-11-14 DIAGNOSIS — H01029 Squamous blepharitis unspecified eye, unspecified eyelid: Secondary | ICD-10-CM | POA: Diagnosis not present

## 2013-11-14 DIAGNOSIS — H0019 Chalazion unspecified eye, unspecified eyelid: Secondary | ICD-10-CM | POA: Diagnosis not present

## 2013-11-28 ENCOUNTER — Other Ambulatory Visit: Payer: Self-pay | Admitting: Ophthalmology

## 2013-11-28 DIAGNOSIS — H0019 Chalazion unspecified eye, unspecified eyelid: Secondary | ICD-10-CM | POA: Diagnosis not present

## 2013-11-28 DIAGNOSIS — B079 Viral wart, unspecified: Secondary | ICD-10-CM | POA: Diagnosis not present

## 2013-12-13 ENCOUNTER — Other Ambulatory Visit (INDEPENDENT_AMBULATORY_CARE_PROVIDER_SITE_OTHER): Payer: Medicare Other

## 2013-12-13 DIAGNOSIS — M810 Age-related osteoporosis without current pathological fracture: Secondary | ICD-10-CM

## 2013-12-13 LAB — BASIC METABOLIC PANEL
BUN: 26 mg/dL — ABNORMAL HIGH (ref 6–23)
CHLORIDE: 103 meq/L (ref 96–112)
CO2: 27 meq/L (ref 19–32)
Calcium: 9 mg/dL (ref 8.4–10.5)
Creatinine, Ser: 0.8 mg/dL (ref 0.4–1.2)
GFR: 76.48 mL/min (ref >60.00)
Glucose, Bld: 70 mg/dL (ref 70–99)
POTASSIUM: 4 meq/L (ref 3.5–5.1)
Sodium: 139 mEq/L (ref 135–145)

## 2013-12-13 LAB — VITAMIN D 25 HYDROXY (VIT D DEFICIENCY, FRACTURES): VITD: 76.77 ng/mL (ref 30.00–100.00)

## 2013-12-17 DIAGNOSIS — Z23 Encounter for immunization: Secondary | ICD-10-CM | POA: Diagnosis not present

## 2013-12-20 ENCOUNTER — Telehealth: Payer: Self-pay | Admitting: Internal Medicine

## 2013-12-20 MED ORDER — CLONAZEPAM 0.5 MG PO TABS: ORAL_TABLET | ORAL | Status: DC

## 2013-12-20 NOTE — Telephone Encounter (Signed)
Faxed to the pharmacy.

## 2013-12-20 NOTE — Telephone Encounter (Signed)
[place], [facility] [address] is requesting re-fill on clonazePAM (KLONOPIN) 0.5 MG tablet

## 2013-12-20 NOTE — Telephone Encounter (Signed)
Ok x 6months worth 

## 2014-01-01 ENCOUNTER — Ambulatory Visit: Payer: Medicare Other

## 2014-01-02 ENCOUNTER — Telehealth: Payer: Self-pay | Admitting: Internal Medicine

## 2014-01-02 NOTE — Telephone Encounter (Signed)
Jacqueline Burgess, I meant to send this to you. Sorry.

## 2014-01-02 NOTE — Telephone Encounter (Signed)
I have electronically sent pt's info for Prolia insurance verification and will notify you once I have a response. Thank you. °

## 2014-01-04 ENCOUNTER — Telehealth: Payer: Self-pay | Admitting: Internal Medicine

## 2014-01-04 NOTE — Telephone Encounter (Signed)
Jacqueline Burgess spoke with Jacqueline Burgess this am and let her know we have not received approval from insurance yet. Jacqueline Burgess has her name and is working on the [place] now.  Patient called back and would like to know the status of this appt and auth.

## 2014-01-04 NOTE — Telephone Encounter (Signed)
Rose has submitted the prior authorization to pt's ins co. We are waiting for the approval. We will call pt as soon as the approval has come through. Be advised.

## 2014-01-04 NOTE — Telephone Encounter (Signed)
I have informed pt of this however she is requesting to speak with Larene Beach

## 2014-01-08 ENCOUNTER — Encounter: Payer: Self-pay | Admitting: Internal Medicine

## 2014-01-08 NOTE — Telephone Encounter (Signed)
Called pt and lvm advising her that we have not receive the approval from her Ins co yet. Advised pt that I will call as soon as the approval comes through.

## 2014-01-15 NOTE — Telephone Encounter (Signed)
I have rec'd pt's insurance verification for a Prolia injection.  Pt's MCR will cover 80% of the injection leaving pt w/a 20% co-insurance, however, pt's [employer] supplement will consider MCR's co-insurance, but does not cover MCR's deductible (which pt has already met).  With an OV pt will have an estimated responsibility of a $20 co-pay; w/out an OV pt will have an estimated responsibility of $0.  Please make pt aware this is an estimate and we will not know an exact amt until both insurances have paid. I have sent a copy of the summary of benefits to be scanned into pt's chart. Please let me know if you have any questions. Thank you.

## 2014-01-15 NOTE — Telephone Encounter (Signed)
Called pt and advised her per note by Deirdre Peer. Pt understood and is scheduled to have her Prolia inj Wed, Nov 11th at 3:30 pm.

## 2014-01-17 ENCOUNTER — Ambulatory Visit (INDEPENDENT_AMBULATORY_CARE_PROVIDER_SITE_OTHER): Payer: Medicare Other | Admitting: Internal Medicine

## 2014-01-17 ENCOUNTER — Other Ambulatory Visit (INDEPENDENT_AMBULATORY_CARE_PROVIDER_SITE_OTHER): Payer: Medicare Other | Admitting: *Deleted

## 2014-01-17 DIAGNOSIS — M810 Age-related osteoporosis without current pathological fracture: Secondary | ICD-10-CM

## 2014-01-17 MED ORDER — DENOSUMAB 60 MG/ML ~~LOC~~ SOLN
60.0000 mg | Freq: Once | SUBCUTANEOUS | Status: AC
  Administered 2014-01-17: 60 mg via SUBCUTANEOUS

## 2014-01-24 DIAGNOSIS — L300 Nummular dermatitis: Secondary | ICD-10-CM | POA: Diagnosis not present

## 2014-01-24 DIAGNOSIS — Z85828 Personal history of other malignant neoplasm of skin: Secondary | ICD-10-CM | POA: Diagnosis not present

## 2014-01-30 DIAGNOSIS — H01022 Squamous blepharitis right lower eyelid: Secondary | ICD-10-CM | POA: Diagnosis not present

## 2014-01-30 DIAGNOSIS — H02052 Trichiasis without entropian right lower eyelid: Secondary | ICD-10-CM | POA: Diagnosis not present

## 2014-01-30 DIAGNOSIS — Z961 Presence of intraocular lens: Secondary | ICD-10-CM | POA: Diagnosis not present

## 2014-01-30 DIAGNOSIS — H01024 Squamous blepharitis left upper eyelid: Secondary | ICD-10-CM | POA: Diagnosis not present

## 2014-01-30 DIAGNOSIS — D2311 Other benign neoplasm of skin of right eyelid, including canthus: Secondary | ICD-10-CM | POA: Diagnosis not present

## 2014-01-30 DIAGNOSIS — H01021 Squamous blepharitis right upper eyelid: Secondary | ICD-10-CM | POA: Diagnosis not present

## 2014-01-30 DIAGNOSIS — H04123 Dry eye syndrome of bilateral lacrimal glands: Secondary | ICD-10-CM | POA: Diagnosis not present

## 2014-01-30 DIAGNOSIS — H01025 Squamous blepharitis left lower eyelid: Secondary | ICD-10-CM | POA: Diagnosis not present

## 2014-02-14 DIAGNOSIS — H04123 Dry eye syndrome of bilateral lacrimal glands: Secondary | ICD-10-CM | POA: Diagnosis not present

## 2014-02-14 DIAGNOSIS — H01025 Squamous blepharitis left lower eyelid: Secondary | ICD-10-CM | POA: Diagnosis not present

## 2014-02-14 DIAGNOSIS — H01022 Squamous blepharitis right lower eyelid: Secondary | ICD-10-CM | POA: Diagnosis not present

## 2014-02-14 DIAGNOSIS — H01024 Squamous blepharitis left upper eyelid: Secondary | ICD-10-CM | POA: Diagnosis not present

## 2014-02-14 DIAGNOSIS — H01021 Squamous blepharitis right upper eyelid: Secondary | ICD-10-CM | POA: Diagnosis not present

## 2014-02-16 DIAGNOSIS — D2312 Other benign neoplasm of skin of left eyelid, including canthus: Secondary | ICD-10-CM | POA: Diagnosis not present

## 2014-02-21 ENCOUNTER — Telehealth: Payer: Self-pay | Admitting: Internal Medicine

## 2014-02-21 NOTE — Telephone Encounter (Signed)
Chart updated

## 2014-02-21 NOTE — Telephone Encounter (Signed)
Will forward to see who called the pt.

## 2014-02-21 NOTE — Telephone Encounter (Signed)
Patient states she received a call but the message was cut off by her machine.  She didn't know who it was from.  I asked patient if she had her flu shot and she said "yes, in October 2015 at [facility] on [address].

## 2014-02-27 DIAGNOSIS — Z1231 Encounter for screening mammogram for malignant neoplasm of breast: Secondary | ICD-10-CM | POA: Diagnosis not present

## 2014-03-01 ENCOUNTER — Other Ambulatory Visit: Payer: Self-pay | Admitting: Internal Medicine

## 2014-03-13 ENCOUNTER — Encounter: Payer: Self-pay | Admitting: Internal Medicine

## 2014-03-13 ENCOUNTER — Ambulatory Visit (INDEPENDENT_AMBULATORY_CARE_PROVIDER_SITE_OTHER): Payer: Medicare Other | Admitting: Internal Medicine

## 2014-03-13 VITALS — BP 100/70 | Temp 97.6°F | Ht 63.75 in | Wt 116.5 lb

## 2014-03-13 DIAGNOSIS — Z23 Encounter for immunization: Secondary | ICD-10-CM

## 2014-03-13 DIAGNOSIS — Z79899 Other long term (current) drug therapy: Secondary | ICD-10-CM | POA: Diagnosis not present

## 2014-03-13 DIAGNOSIS — G4700 Insomnia, unspecified: Secondary | ICD-10-CM

## 2014-03-13 DIAGNOSIS — Z0000 Encounter for general adult medical examination without abnormal findings: Secondary | ICD-10-CM | POA: Diagnosis not present

## 2014-03-13 DIAGNOSIS — Z974 Presence of external hearing-aid: Secondary | ICD-10-CM | POA: Diagnosis not present

## 2014-03-13 MED ORDER — CLONAZEPAM 0.5 MG PO TABS: 0.2500 mg | ORAL_TABLET | Freq: Every day | ORAL | Status: DC

## 2014-03-13 MED ORDER — NORTRIPTYLINE HCL 10 MG PO CAPS: 10.0000 mg | ORAL_CAPSULE | Freq: Every day | ORAL | Status: DC

## 2014-03-13 NOTE — Patient Instructions (Addendum)
Yearly flu vaccine  You are taking 3 high risk medications  risk for memory problems and falling  however the dosage is very low  Stop the muscle relaxant and use only as needed for spasm. Minimize dose of elavil  and clonipen and dont take with alcohol. Because you ar sleeping so well and feel not causing fogginess or balance issues we can contiue at this time bbut reassess risk benefit yearly .  Can see audiology at [place] you are well .   Healthy lifestyle includes : At least 150 minutes of exercise weeks  , weight at healthy levels, which is usually   BMI 19-25. Avoid trans fats and processed foods;  Increase fresh fruits and veges to 5 servings per day. And avoid sweet beverages including tea and juice. Mediterranean diet with olive oil and nuts have been noted to be heart and brain healthy . Avoid tobacco products . Limit  alcohol to  7 per week for women and 14 servings for men.  Get adequate sleep . Wear seat belts . Don't text and drive .  return office visit in 6 months or as needed

## 2014-03-13 NOTE — Progress Notes (Signed)
Pre visit review using our clinic review tool, if applicable. No additional management support is needed unless otherwise documented below in the visit note.  Chief Complaint  Patient presents with  . Medicare Wellness    medication    HPI: Jacqueline Burgess 74 y.o.  comes in today for Preventive Medicare wellness visit . And medication management  No major injuries, ed visits ,hospitalizations , new medications since last visit. Hwever  Persistent area of Skin rashes all over and on betamethasone ointment .  ?per derm.  Cause . Taking low 1/2 clonazepam and robaxin and 10 of elavil every night and "sleeps well"  Added muscle relaxant after having back problem given by ortho.  Denies se grogginess memory issues  Health Maintenance  Topic Date Due  . INFLUENZA VACCINE  10/08/2014  . COLONOSCOPY  07/07/2015  . TETANUS/TDAP  03/13/2024  . DEXA SCAN  Completed  . PNEUMOCOCCAL POLYSACCHARIDE VACCINE AGE 34 AND OVER  Addressed  . ZOSTAVAX  Addressed   Health Maintenance Review  LIFESTYLE:  Exercise:   Walking  Every da  3 miles  Tobacco/ETS: no Alcohol: wine 3 x per week Sugar beverages:oj once  Sleep: about 8 hours  Drug use: no Bone density: ? recent  Colonoscopy: utd Mammogram ok.     Hearing:  has hearing aids   Vision:  No limitations at present . Last eye check UTD chlazion cut off .    Safety:  Has smoke detector and wears seat belts.  No firearms. No excess sun exposure. Sees dentist regularly.  Falls: no  Advance directive :  Reviewed  Has one.  Memory: Felt to be good   Ok some naming other , no concern from her or her family.  Depression: No anhedonia unusual crying or depressive symptoms  Nutrition: Eats well balanced diet; adequate calcium and vitamin D. No swallowing chewing problems.  Injury: no major injuries in the last six months.  Other healthcare providers:  Reviewed today .  Social:  Lives with spouse married. No pets.   Preventive  parameters: up-to-date  Reviewed   ADLS:   There are no problems or need for assistance  driving, feeding, obtaining food, dressing, toileting and bathing, managing money using phone. She is independent.  ROS:  GEN/ HEENT: No fever, significant weight changes sweats headaches vision problems hearing changes, CV/ PULM; No chest pain shortness of breath cough, syncope,edema  change in exercise tolerance. GI /GU: No adominal pain, vomiting, change in bowel habits. No blood in the stool. No significant GU symptoms. SKIN/HEME: ,no acute skin rashes suspicious lesions or bleeding. No lymphadenopathy, nodules, masses.  NEURO/ PSYCH:  No neurologic signs such as weakness numbness. No depression anxiety. IMM/ Allergy: No unusual infections.  Allergy .   REST of 12 system review negative except as per HPI   Past Medical History  Diagnosis Date  . Arthritis   . Cataract     lens implant  . Osteoporosis     eval dr Buddy Duty in past dexa 11 12   . Kidney stones 1967  . Colon polyps   . Closed pelvic fracture 2012    pelvis and shoulder   . Hx of fall     fracture pelvis and left shoulder humerus  . Hx of nonmelanoma skin cancer     ssca  . MVP (mitral valve prolapse)     in records  no echo eval in records  . Hx of syncope     had neg  event monitor per dr Marya Amsler taylor in 2010  . 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 CVA Mother     Died in her 09F complications rheumatic  fever afterwards when she got a stroke  . Lung cancer Sister     Died in 36s was a smoker  . Rheumatic fever Mother   . Thyroid cancer Daughter     Papillary    History   Social History  . Marital Status: Married    Spouse Name: N/A    Number of Children: N/A  . Years of Education: N/A   Social History Main Topics  . Smoking status: Never Smoker   . Smokeless tobacco: None  . Alcohol Use: 1.5 oz/week    3 drink(s) per week  . Drug Use: No  . Sexual  Activity:    Partners: Female   Other Topics Concern  . 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   g4 P4   Regular exercise: walks daily   Caffeine use: 2 cups of coffee daily          Outpatient Encounter Prescriptions as of 03/13/2014  Medication Sig  . amitriptyline (ELAVIL) 10 MG tablet TAKE 1 TABLET  EVERY  NIGHT AT BEDTIME  . betamethasone dipropionate (DIPROLENE) 0.05 % cream Apply 1 application topically 2 (two) times daily.  . Calcium Carbonate-Vitamin D (CALCIUM-VITAMIN D) 500-200 MG-UNIT per tablet Take 1 tablet by mouth 2 (two) times daily with a meal.    . clonazePAM (KLONOPIN) 0.5 MG tablet Take 0.5-1 tablets (0.25-0.5 mg total) by mouth at bedtime. As needed  . glucosamine-chondroitin 500-400 MG tablet Take 1 tablet by mouth daily.    . methocarbamol (ROBAXIN) 500 MG tablet Take 500 mg by mouth. Using about 3 times weekly.  . Multiple Vitamins-Minerals (MULTIVITAL) tablet Take 1 tablet by mouth daily.    Marland Kitchen tretinoin (RETIN-A) 0.025 % cream   . VITAMIN D, ERGOCALCIFEROL, PO Take by mouth.    . [DISCONTINUED] clonazePAM (KLONOPIN) 0.5 MG tablet TAKE 1 TABLET BY MOUTH AT BEDTIME AS NEEDED  . nortriptyline (PAMELOR) 10 MG capsule Take 1 capsule (10 mg total) by mouth at bedtime.  . [DISCONTINUED] conjugated estrogens (PREMARIN) vaginal cream Use 3 x per week  Vaginally as directed  . [DISCONTINUED] cycloSPORINE (RESTASIS) 0.05 % ophthalmic emulsion Place 1 drop into both eyes 2 (two) times daily.    EXAM:  BP 100/70 mmHg  Temp(Src) 97.6 F (36.4 C) (Oral)  Ht 5' 3.75" (1.619 m)  Wt 116 lb 8 oz (52.844 kg)  BMI 20.16 kg/m2  Body mass index is 20.16 kg/(m^2).  Physical Exam: Vital signs reviewed GHW:EXHB is a well-developed well-nourished alert cooperative   who  appears stated age in no acute distress.  HEENT: normocephalic atraumatic , Eyes: PERRL EOM's full, conjunctiva clear, Nares: paten,t no deformity discharge or tenderness., Ears: no deformity EAC's clear TMs with normal landmarks. Mouth: clear OP, no lesions, edema.  Moist mucous membranes. Dentition in adequate repair. NECK: supple without masses, thyromegaly or bruits. CHEST/PULM:  Clear to auscultation and percussion breath sounds equal no wheeze , rales or rhonchi. No chest wall deformities or tenderness. CV: PMI is nondisplaced, S1  S2 no gallops, murmurs, rubs. Peripheral pulses are full without delay.No JVD .  ABDOMEN: Bowel sounds normal nontender  No guard or rebound, no hepato splenomegal no CVA tenderness.  No hernia. Extremtities:  No clubbing cyanosis or edema, no acute joint swelling or redness no focal atrophy NEURO:  Oriented x3, cranial nerves 3-12 appear to be intact, no obvious focal weakness,gait within normal limits no abnormal reflexes or asymmetrical SKIN: No acute rashes normal turgor, color, no bruising or petechiae. PSYCH: Oriented, good eye contact, no obvious depression anxiety, cognition and judgment appear normal. LN: no cervical axillary inguinal adenopathy No noted deficits in memory, attention, and speech.   Lab Results  Component Value Date   WBC 5.6 02/26/2012   HGB 14.5 02/26/2012   HCT 42.8 02/26/2012   PLT 162.0 02/26/2012   GLUCOSE 70 12/13/2013   CHOL 216* 03/14/2013   TRIG 120.0 03/14/2013   HDL 61.00 03/14/2013   LDLDIRECT 136.8 03/14/2013   ALT 19 02/26/2012   AST 24 02/26/2012   NA 139 12/13/2013   K 4.0 12/13/2013   CL 103 12/13/2013   CREATININE 0.8 12/13/2013   BUN 26* 12/13/2013   CO2 27 12/13/2013   TSH 0.43 03/14/2013   INR 0.98 11/11/2010    ASSESSMENT AND PLAN:  Discussed the following assessment and plan:  Medicare annual wellness visit, subsequent  Medication management  Insomnia - somehow ended up on 3 low dose risk   meds for her sleep disc risk life style methods   High risk medication use - benfot more than risk  can try nortripytptiline as per humana request instead of elavil but very low dose   Need for tetanus booster - Plan: Td vaccine greater than or equal to 7yo preservative free IM Taking 1/2 clonipen and 10 elavil  Can try nortriptyline 10 instead  Avoid muscle  relax only as needed  Doing very wellotherwise  at this time .   Patient Care Team: Burnis Medin, MD as PCP - General (Internal Medicine) Logan Bores, MD as Attending Physician (Obstetrics and Gynecology) Johnny Bridge, MD as Attending Physician (Orthopedic Surgery) Clent Jacks, MD (Ophthalmology) Rolm Bookbinder, MD as Attending Physician (Dermatology) Irene Shipper, MD as Attending Physician (Gastroenterology)  Patient Instructions  Yearly flu vaccine  You are taking 3 high risk medications  risk for memory problems and falling  however the dosage is very low  Stop the muscle relaxant and use only as needed for spasm. Minimize dose of elavil  and clonipen and dont take with alcohol. Because you ar sleeping so well and feel not causing fogginess or balance issues we can contiue at this time bbut reassess risk benefit yearly .  Can see audiology at [facility] you are well .   Healthy lifestyle includes : At least 150 minutes of exercise weeks  , weight at healthy levels, which is usually   BMI 19-25. Avoid trans fats and processed foods;  Increase fresh fruits and veges to 5 servings per day. And avoid sweet beverages including tea and juice. Mediterranean diet with olive oil and nuts have been noted to be heart and brain healthy . Avoid tobacco products . Limit  alcohol to  7 per week for women and 14 servings for men.  Get adequate sleep . Wear seat belts . Don't text and drive .  return office visit in 6 months or as needed      Standley Brooking. Panosh M.D.

## 2014-03-14 ENCOUNTER — Telehealth: Payer: Self-pay | Admitting: Family Medicine

## 2014-03-14 NOTE — Telephone Encounter (Signed)
Pt called and left a message stating that she did not have her hearing aid in yesterday (03-13-14)  and was afraid that she was given a influenza vaccine.  Stated she also had one at [facility].  Now concerned that her insurance will be double charged and she will have to pay.  Pt was not given an influenza vaccine.  She was given a pneumonia and tetanus vaccine.  Tried to reach the pt.  Left a message for her to return my call.

## 2014-03-15 NOTE — Telephone Encounter (Signed)
Pt notified by telephone. 

## 2014-03-17 ENCOUNTER — Encounter: Payer: Self-pay | Admitting: Internal Medicine

## 2014-03-17 DIAGNOSIS — Z974 Presence of external hearing-aid: Secondary | ICD-10-CM | POA: Insufficient documentation

## 2014-03-21 ENCOUNTER — Encounter: Payer: Self-pay | Admitting: Internal Medicine

## 2014-03-28 DIAGNOSIS — Z85828 Personal history of other malignant neoplasm of skin: Secondary | ICD-10-CM | POA: Diagnosis not present

## 2014-03-28 DIAGNOSIS — D1801 Hemangioma of skin and subcutaneous tissue: Secondary | ICD-10-CM | POA: Diagnosis not present

## 2014-03-28 DIAGNOSIS — L308 Other specified dermatitis: Secondary | ICD-10-CM | POA: Diagnosis not present

## 2014-03-28 DIAGNOSIS — L820 Inflamed seborrheic keratosis: Secondary | ICD-10-CM | POA: Diagnosis not present

## 2014-03-28 DIAGNOSIS — L814 Other melanin hyperpigmentation: Secondary | ICD-10-CM | POA: Diagnosis not present

## 2014-03-28 DIAGNOSIS — L821 Other seborrheic keratosis: Secondary | ICD-10-CM | POA: Diagnosis not present

## 2014-05-02 DIAGNOSIS — H01024 Squamous blepharitis left upper eyelid: Secondary | ICD-10-CM | POA: Diagnosis not present

## 2014-05-02 DIAGNOSIS — H01022 Squamous blepharitis right lower eyelid: Secondary | ICD-10-CM | POA: Diagnosis not present

## 2014-05-02 DIAGNOSIS — D2311 Other benign neoplasm of skin of right eyelid, including canthus: Secondary | ICD-10-CM | POA: Diagnosis not present

## 2014-05-02 DIAGNOSIS — H0015 Chalazion left lower eyelid: Secondary | ICD-10-CM | POA: Diagnosis not present

## 2014-05-02 DIAGNOSIS — H01025 Squamous blepharitis left lower eyelid: Secondary | ICD-10-CM | POA: Diagnosis not present

## 2014-05-02 DIAGNOSIS — H01021 Squamous blepharitis right upper eyelid: Secondary | ICD-10-CM | POA: Diagnosis not present

## 2014-05-21 ENCOUNTER — Telehealth: Payer: Self-pay | Admitting: *Deleted

## 2014-05-21 NOTE — Telephone Encounter (Signed)
Rose, can you intiate a PA for Prolia for Ms Jacqueline Burgess? Thank you.

## 2014-05-24 NOTE — Telephone Encounter (Signed)
I sent pt's info for [place] verification and will notify you once I have a response. Thank you.

## 2014-05-31 NOTE — Telephone Encounter (Signed)
I have rec'd pt's Prolia insurance verification.  Pt's primary, [employer] will cover 80% of Prolia once $166 deductible is met ($166 is met) leaving pt w/a 20% co-insurance.  Her secondary, [employer] will consider the Part B co-insurance up to [facility] allowable, but does not cover the [facility] Part B deductible (which is met).  If an OV is not billed pt will have an estimated responsibility of $0; if an OV is billed pt will have an estimated responsibility of $20 co-pay.  Please make pt aware this is only an estimate and we will not know an exact amt until both insurances have paid.  I have sent a copy of the summary of benefits to be scanned into her chart.  If you have any questions, please let me know. Thank you.

## 2014-06-05 ENCOUNTER — Other Ambulatory Visit: Payer: Self-pay | Admitting: Internal Medicine

## 2014-06-06 NOTE — Telephone Encounter (Signed)
Filled for 6 months on 03/04/15.  Request is too early.

## 2014-06-13 DIAGNOSIS — H01021 Squamous blepharitis right upper eyelid: Secondary | ICD-10-CM | POA: Diagnosis not present

## 2014-06-13 DIAGNOSIS — H01024 Squamous blepharitis left upper eyelid: Secondary | ICD-10-CM | POA: Diagnosis not present

## 2014-06-13 DIAGNOSIS — H04123 Dry eye syndrome of bilateral lacrimal glands: Secondary | ICD-10-CM | POA: Diagnosis not present

## 2014-06-13 DIAGNOSIS — H01025 Squamous blepharitis left lower eyelid: Secondary | ICD-10-CM | POA: Diagnosis not present

## 2014-06-13 DIAGNOSIS — Z961 Presence of intraocular lens: Secondary | ICD-10-CM | POA: Diagnosis not present

## 2014-06-13 DIAGNOSIS — H26492 Other secondary cataract, left eye: Secondary | ICD-10-CM | POA: Diagnosis not present

## 2014-06-13 DIAGNOSIS — H01022 Squamous blepharitis right lower eyelid: Secondary | ICD-10-CM | POA: Diagnosis not present

## 2014-06-15 DIAGNOSIS — H26492 Other secondary cataract, left eye: Secondary | ICD-10-CM | POA: Diagnosis not present

## 2014-07-10 ENCOUNTER — Telehealth: Payer: Self-pay | Admitting: Internal Medicine

## 2014-07-10 NOTE — Telephone Encounter (Signed)
Called pt and scheduled appt for 5/13 at 9:45 am for a Osteoporosis f/up and Prolia inj.

## 2014-07-10 NOTE — Telephone Encounter (Signed)
Patient is calling regarding her prolia shot, please advise

## 2014-07-11 DIAGNOSIS — H01024 Squamous blepharitis left upper eyelid: Secondary | ICD-10-CM | POA: Diagnosis not present

## 2014-07-11 DIAGNOSIS — H01022 Squamous blepharitis right lower eyelid: Secondary | ICD-10-CM | POA: Diagnosis not present

## 2014-07-11 DIAGNOSIS — H01021 Squamous blepharitis right upper eyelid: Secondary | ICD-10-CM | POA: Diagnosis not present

## 2014-07-11 DIAGNOSIS — H00021 Hordeolum internum right upper eyelid: Secondary | ICD-10-CM | POA: Diagnosis not present

## 2014-07-11 DIAGNOSIS — H01025 Squamous blepharitis left lower eyelid: Secondary | ICD-10-CM | POA: Diagnosis not present

## 2014-07-11 DIAGNOSIS — H04123 Dry eye syndrome of bilateral lacrimal glands: Secondary | ICD-10-CM | POA: Diagnosis not present

## 2014-07-17 ENCOUNTER — Other Ambulatory Visit: Payer: Self-pay | Admitting: Internal Medicine

## 2014-07-18 NOTE — Telephone Encounter (Signed)
Sent to the pharmacy by e-scribe. 

## 2014-07-20 ENCOUNTER — Ambulatory Visit (INDEPENDENT_AMBULATORY_CARE_PROVIDER_SITE_OTHER): Payer: Medicare Other | Admitting: Internal Medicine

## 2014-07-20 ENCOUNTER — Encounter: Payer: Self-pay | Admitting: Internal Medicine

## 2014-07-20 VITALS — BP 112/60 | HR 79 | Temp 98.2°F | Resp 12 | Wt 117.0 lb

## 2014-07-20 DIAGNOSIS — M810 Age-related osteoporosis without current pathological fracture: Secondary | ICD-10-CM | POA: Diagnosis not present

## 2014-07-20 LAB — BASIC METABOLIC PANEL
BUN: 22 mg/dL (ref 6–23)
CO2: 29 mEq/L (ref 19–32)
Calcium: 9.4 mg/dL (ref 8.4–10.5)
Chloride: 97 mEq/L (ref 96–112)
Creatinine, Ser: 0.74 mg/dL (ref 0.40–1.20)
GFR: 81.14 mL/min (ref >60.00)
GLUCOSE: 84 mg/dL (ref 70–99)
POTASSIUM: 4.5 meq/L (ref 3.5–5.1)
Sodium: 131 mEq/L — ABNORMAL LOW (ref 135–145)

## 2014-07-20 LAB — VITAMIN D 25 HYDROXY (VIT D DEFICIENCY, FRACTURES): VITD: 51.86 ng/mL (ref 30.00–100.00)

## 2014-07-20 NOTE — Patient Instructions (Signed)
Please stop at the lab.  Please return in 1 year with your sugar log.   We will get you set up for Reclast infusion.

## 2014-07-20 NOTE — Progress Notes (Signed)
Patient ID: Jacqueline Burgess, female   DOB: 1941/02/01, 74 y.o.   MRN: [id number]   HPI  Jacqueline Burgess is a 74 y.o.-year-old female, returning for f/u for osteoporosis, dx 2000. Last visit 9 mo ago.  Since last visit, she had 3 Prolia injections, which she tolerated well.  No fx's in the interim. No pain in legs or jaw.   She has recurring chalazion and some red patches on skin..  Reviewed hx - and annotated it: DEXA scans:  Date L1-L2 T score Hip - L&R T score 33% distal Radius T score UD radius  02/05/2011 - [place] Deg. changes, severe scoliosis: -3.1 LFN: - 1.2 RFN: -1.7 -3.5  -4.6   For the 2010 DEXA,  I only have the reading, not the actual images + report: Date L1-L2 T score Total R hip % change 33% distal Radius T score  01/25/2009 n/a -2.4% (sig)  ! Not T score -3.2   I reviewed patient's previous imaging results: In 02/06/2000: Xray: R great toe comminuted non-displaced fx - prox. Phalanx - desk fell on toe In 02/11/2003: bone scan: probable L T8 rib fx  Pt had a closed L pelvic (sup and inf pubic rami) fx and a shoulder fx in 2012 - seen on Xray from 11/13/2010, after a fall from level ground (orthostasis).   She denies any recent falls. No dizziness/vertigo/orthostasis.  She continues to walk daily.  No h/o hyper/hypocalcemia. No h/o hyperparathyroidism. Has a remote h/o kidney stones in 1964 (pregnancy related).  Lab Results  Component Value Date   PTH 28.7 02/26/2012   CALCIUM 9.0 12/13/2013   CALCIUM 9.1 03/14/2013   CALCIUM 9.1 02/26/2012   CALCIUM 9.6 02/26/2012   CALCIUM 8.2* 11/13/2010   CALCIUM 7.8* 11/12/2010   CALCIUM 9.5 11/11/2010   No h/o thyrotoxicosis. Reviewed TSH recent levels:  Lab Results  Component Value Date   TSH 0.43 03/14/2013   TSH 0.89 02/26/2012   No h/o vitamin D deficiency. Last vit D level was:  Component     Latest Ref Rng 02/26/2012 12/13/2013  Vit D, 25-Hydroxy     30 - 89 ng/mL 77   VITD     30.00 - 100.00 ng/mL   76.77   No h/o CKD. Last BUN/Cr: Lab Results  Component Value Date   BUN 26* 12/13/2013   CREATININE 0.8 12/13/2013   Pt is on calcium citrate - D Once a day at night and a vitamin D 2000 IU in am. She takes a MVI.  She has a previous history of vitamin D deficiency, per review of her chart, and was on ergocalciferol in the past.   She still has palpitations >> had an event monitor 2001 >> normal.  ROS: Constitutional: no weight gain/loss, no fatigue, no subjective hyperthermia/hypothermia Eyes: no blurry vision, no xerophthalmia ENT: no sore throat, no nodules palpated in throat, no dysphagia/odynophagia, no hoarseness Cardiovascular: no CP/SOB/+ palpitations/no leg swelling Respiratory: no cough/SOB Gastrointestinal: no N/V/D/C Musculoskeletal: no muscle/joint aches Skin: + rash  - leg; + hair loss Neurological: no tremors/numbness/tingling/dizziness  PE: BP 112/60 mmHg  Pulse 79  Temp(Src) 98.2 F (36.8 C) (Oral)  Resp 12  Wt 117 lb (53.071 kg)  SpO2 98% Wt Readings from Last 3 Encounters:  07/20/14 117 lb (53.071 kg)  03/13/14 116 lb 8 oz (52.844 kg)  10/12/13 115 lb 12.8 oz (52.527 kg)   Constitutional: very thin, in NAD. No kyphosis. Eyes: PERRLA, EOMI, no exophthalmos ENT: moist mucous membranes,  no thyromegaly, no cervical lymphadenopathy Cardiovascular: RRR, No MRG Respiratory: CTA B Gastrointestinal: abdomen soft, NT, ND, BS+ Musculoskeletal: no deformities, strength intact in all 4 Skin: moist, warm, no rashes Neurological: no tremor with outstretched hands, DTR normal in all 4  Assessment: 1. Osteoporosis - h/o 3-4 years of po Bisph 10 years ago - on Prolia x 3 - h/o fxs  Plan: 1. Osteoporosis - reviewed previous labs and DEXA scans - will check another DEXA scan - pt had 3 doses of Prolia >> did not have a fracture after starting Prolia. She, however, has recurrent chalazion >> very bothersome >> I advised her that I cannot rule out an assoc. With  Prolia >> we decided to switch to Reclast >> will arrange - continue current vitamin D doses - will check a vit D level and a BMP today - I will see her back in 1 year   Office Visit on 07/20/2014  Component Date Value Ref Range Status  . VITD 07/20/2014 51.86  30.00 - 100.00 ng/mL Final  . Sodium 07/20/2014 131* 135 - 145 mEq/L Final  . Potassium 07/20/2014 4.5  3.5 - 5.1 mEq/L Final  . Chloride 07/20/2014 97  96 - 112 mEq/L Final  . CO2 07/20/2014 29  19 - 32 mEq/L Final  . Glucose, Bld 07/20/2014 84  70 - 99 mg/dL Final  . BUN 07/20/2014 22  6 - 23 mg/dL Final  . Creatinine, Ser 07/20/2014 0.74  0.40 - 1.20 mg/dL Final  . Calcium 07/20/2014 9.4  8.4 - 10.5 mg/dL Final  . GFR 07/20/2014 81.14  >60.00 mL/min Final   Normal calcium, vitamin D and kidney fxn. Slightly low sodium. Will advise her to stay well hydrated, especially right before the Reclast injection. DEXA pending.

## 2014-08-03 ENCOUNTER — Encounter: Payer: Self-pay | Admitting: Internal Medicine

## 2014-08-03 DIAGNOSIS — H00021 Hordeolum internum right upper eyelid: Secondary | ICD-10-CM | POA: Diagnosis not present

## 2014-08-03 DIAGNOSIS — H01024 Squamous blepharitis left upper eyelid: Secondary | ICD-10-CM | POA: Diagnosis not present

## 2014-08-03 DIAGNOSIS — H01021 Squamous blepharitis right upper eyelid: Secondary | ICD-10-CM | POA: Diagnosis not present

## 2014-08-03 DIAGNOSIS — H01022 Squamous blepharitis right lower eyelid: Secondary | ICD-10-CM | POA: Diagnosis not present

## 2014-08-03 DIAGNOSIS — H01025 Squamous blepharitis left lower eyelid: Secondary | ICD-10-CM | POA: Diagnosis not present

## 2014-08-03 DIAGNOSIS — H04123 Dry eye syndrome of bilateral lacrimal glands: Secondary | ICD-10-CM | POA: Diagnosis not present

## 2014-08-09 ENCOUNTER — Telehealth: Payer: Self-pay | Admitting: *Deleted

## 2014-08-09 NOTE — Telephone Encounter (Signed)
Jacqueline Burgess, pt was seen by Dr Cruzita Lederer on 5/13. Dr Cruzita Lederer has ordered a Reclast for pt. I would like to know if you think that you will be trained and ready to do the Reclast infusions before the end of June? The patient would like to have it done here, but will go to [facility] if she needs to. Please advise. Thank you.

## 2014-08-13 ENCOUNTER — Telehealth: Payer: Self-pay | Admitting: Internal Medicine

## 2014-08-13 NOTE — Telephone Encounter (Signed)
Pt needs to know if she makes apt for prolia shot at hospital or does shannon do it please call pt back and let her knoq

## 2014-08-15 NOTE — Telephone Encounter (Signed)
Stefanie told me she has called to make me an appt. At the ER, but she has heard nothing.  I am on vacation next week.  Don't believe it will be before the end of June

## 2014-08-15 NOTE — Telephone Encounter (Signed)
I will schedule pt to have her Reclast infusion at [facility] and we will do future ones (once you are ready) here in the office.

## 2014-08-24 NOTE — Telephone Encounter (Signed)
Pt will need Reclast done at [facility]. [facility]asta will not be ready to do infusions this month. Pt will also need a BMP done again.

## 2014-09-05 NOTE — Telephone Encounter (Signed)
Pt returned call and scheduled her labs for Thurs, July 7th.

## 2014-09-05 NOTE — Telephone Encounter (Signed)
Left 2 messages for pt to call back. Pt will need new labs done to have reclast infusion done at [facility]. Advised pt to call back.

## 2014-09-11 ENCOUNTER — Ambulatory Visit (INDEPENDENT_AMBULATORY_CARE_PROVIDER_SITE_OTHER): Payer: Medicare Other | Admitting: Internal Medicine

## 2014-09-11 ENCOUNTER — Encounter: Payer: Self-pay | Admitting: Internal Medicine

## 2014-09-11 VITALS — BP 122/80 | Temp 98.1°F | Ht 63.75 in | Wt 117.4 lb

## 2014-09-11 DIAGNOSIS — G4700 Insomnia, unspecified: Secondary | ICD-10-CM | POA: Diagnosis not present

## 2014-09-11 DIAGNOSIS — M810 Age-related osteoporosis without current pathological fracture: Secondary | ICD-10-CM

## 2014-09-11 DIAGNOSIS — Z79899 Other long term (current) drug therapy: Secondary | ICD-10-CM | POA: Diagnosis not present

## 2014-09-11 MED ORDER — AMITRIPTYLINE HCL 10 MG PO TABS: ORAL_TABLET | ORAL | Status: DC

## 2014-09-11 NOTE — Progress Notes (Signed)
Pre visit review using our clinic review tool, if applicable. No additional management support is needed unless otherwise documented below in the visit note.  Chief Complaint  Patient presents with  . Follow-up    medications managment    HPI: Jacqueline Burgess 74 y.o.  comes in for chronic disease/ medication management  contineus to do well with sleep on 10 elavil 1/4 robaxin and 1/2 clonipen   No hangover does well   Better than other tried in  Past  Takes   A 1/4 if needed for back and npot groggy and  No hangover.s  Needs refill of eleavil.  Osteoporosis : To do reclast   Instead of prolia   Has seen derm and euye for  Recurrent   chlalazion and  Rashes.    Has seen derm.,  Dr Ubaldo Glassing uncertain if related but she is concerned .  Has been rx with a numbner of creams  No dx  ROS: See pertinent positives and negatives per HPI.  Past Medical History  Diagnosis Date  . Arthritis   . Cataract     lens implant  . Osteoporosis     eval dr Buddy Duty in past dexa 11 12   . Kidney stones 1967  . Colon polyps   . Closed pelvic fracture 2012    pelvis and shoulder   . Hx of fall     fracture pelvis and left shoulder humerus  . Hx of nonmelanoma skin cancer     ssca  . MVP (mitral valve prolapse)     in records  no echo eval in records  . Hx of syncope     had neg event monitor per dr Marya Amsler taylor in 2010  . 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 CVA Mother     Died in her 03K complications rheumatic  fever afterwards when she got a stroke  . Lung cancer Sister     Died in 2s was a smoker  . Rheumatic fever Mother   . Thyroid cancer Daughter     Papillary    History   Social History  . Marital Status: Married    Spouse Name: N/A  . Number of Children: N/A  . Years of Education: N/A   Social History Main Topics  . Smoking status: Never Smoker   . Smokeless tobacco: Not on file  . Alcohol Use: 1.5 oz/week    3 drink(s)  per week  . Drug Use: No  . Sexual Activity:    Partners: Female   Other Topics Concern  . 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   g4 P4   Regular exercise: walks daily   Caffeine use: 2 cups of coffee daily          Outpatient Prescriptions Prior to Visit  Medication Sig Dispense Refill  . betamethasone dipropionate (DIPROLENE) 0.05 % cream Apply 1 application topically 2 (two) times daily.    . Calcium Carbonate-Vitamin D (CALCIUM-VITAMIN D) 500-200 MG-UNIT per tablet Take 1 tablet by mouth 2 (two) times daily with a meal.      . clonazePAM (KLONOPIN) 0.5 MG tablet Take 0.5-1 tablets (0.25-0.5 mg total) by  mouth at bedtime. As needed 90 tablet 1  . glucosamine-chondroitin 500-400 MG tablet Take 1 tablet by mouth daily.      . methocarbamol (ROBAXIN) 500 MG tablet Take 500 mg by mouth. Using about 3 times weekly.    . Multiple Vitamins-Minerals (MULTIVITAL) tablet Take 1 tablet by mouth daily.      Marland Kitchen tretinoin (RETIN-A) 0.025 % cream     . VITAMIN D, ERGOCALCIFEROL, PO Take by mouth.      Marland Kitchen amitriptyline (ELAVIL) 10 MG tablet TAKE 1 TABLET  EVERY  NIGHT AT BEDTIME 90 tablet 0  . nortriptyline (PAMELOR) 10 MG capsule Take 1 capsule (10 mg total) by mouth at bedtime. 30 capsule 0   No facility-administered medications prior to visit.     EXAM:  BP 122/80 mmHg  Temp(Src) 98.1 F (36.7 C) (Oral)  Ht 5' 3.75" (1.619 m)  Wt 117 lb 6.4 oz (53.252 kg)  BMI 20.32 kg/m2  Body mass index is 20.32 kg/(m^2).  GENERAL: vitals reviewed and listed above, alert, oriented, appears well hydrated and in no acute distress looks well  Nl gait  HEENT: atraumatic, conjunctiva  clear, no obvious abnormalities on inspection of external nose and ears NECK: no obvious masses on inspection palpation   LUNGS: clear to auscultation bilaterally, no wheezes, rales or rhonchi,  CV: HRRR, no clubbing cyanosis or  peripheral edema nl cap refill  Skin faded patch dark on right leg  1 cm red patch right chest no crusting or vescilces sun changes  MS: moves all extremities without noticeable focal  abnormality PSYCH: pleasant and cooperative, no obvious depression or anxiety Lab Results  Component Value Date   WBC 5.6 02/26/2012   HGB 14.5 02/26/2012   HCT 42.8 02/26/2012   PLT 162.0 02/26/2012   GLUCOSE 84 07/20/2014   CHOL 216* 03/14/2013   TRIG 120.0 03/14/2013   HDL 61.00 03/14/2013   LDLDIRECT 136.8 03/14/2013   ALT 19 02/26/2012   AST 24 02/26/2012   NA 131* 07/20/2014   K 4.5 07/20/2014   CL 97 07/20/2014   CREATININE 0.74 07/20/2014   BUN 22 07/20/2014   CO2 29 07/20/2014   TSH 0.43 03/14/2013   INR 0.98 11/11/2010   Wt Readings from Last 3 Encounters:  09/11/14 117 lb 6.4 oz (53.252 kg)  07/20/14 117 lb (53.071 kg)  03/13/14 116 lb 8 oz (52.844 kg)    ASSESSMENT AND PLAN:  Discussed the following assessment and plan:  Insomnia - benefit more than risk at this time reviwed caution and limit other meds   Medication management  High risk medication use  Osteoporosis - change in to reclast ? if rashes and chalazions could be from the prolia   -Patient advised to return or notify health care team  if symptoms worsen ,persist or new concerns arise.  Patient Instructions  Plan continue as discussed . Wellness  And med check in  About 6 months .     Standley Brooking. Izadora Roehr M.D.

## 2014-09-12 ENCOUNTER — Other Ambulatory Visit: Payer: Self-pay | Admitting: *Deleted

## 2014-09-12 DIAGNOSIS — M810 Age-related osteoporosis without current pathological fracture: Secondary | ICD-10-CM

## 2014-09-12 NOTE — Patient Instructions (Signed)
Plan continue as discussed . Wellness  And med check in  About 6 months .

## 2014-09-13 ENCOUNTER — Other Ambulatory Visit (INDEPENDENT_AMBULATORY_CARE_PROVIDER_SITE_OTHER): Payer: Medicare Other

## 2014-09-13 ENCOUNTER — Telehealth: Payer: Self-pay | Admitting: *Deleted

## 2014-09-13 DIAGNOSIS — M810 Age-related osteoporosis without current pathological fracture: Secondary | ICD-10-CM | POA: Diagnosis not present

## 2014-09-13 LAB — BASIC METABOLIC PANEL
BUN: 24 mg/dL — ABNORMAL HIGH (ref 6–23)
CALCIUM: 9.5 mg/dL (ref 8.4–10.5)
CHLORIDE: 102 meq/L (ref 96–112)
CO2: 30 mEq/L (ref 19–32)
Creatinine, Ser: 0.76 mg/dL (ref 0.40–1.20)
GFR: 78.65 mL/min (ref >60.00)
Glucose, Bld: 97 mg/dL (ref 70–99)
Potassium: 4 mEq/L (ref 3.5–5.1)
Sodium: 139 mEq/L (ref 135–145)

## 2014-09-13 NOTE — Telephone Encounter (Signed)
Pt calling back can do 7/12 or 7/13

## 2014-09-13 NOTE — Telephone Encounter (Signed)
Pt was here for labs and asked if I would speak with Dr Cruzita Lederer about her concerns on having the Reclast. This is her question: Jacqueline Burgess came in to have a BMP for her Reclast infusion.  Pt is concerned about any side effects. She said that she has spent the last year going to so many dermatologist appts with skin issues due to the Prolia inj and she is concerned that the Reclast may cause even more problems. Can you please advise me, so I can let her know?  Dr Arman Filter answer: Jacqueline Burgess is known to possibly cause skin problems, but Reclast is not. I would highly recommend trying Reclast. It should not exacerbate or cause skin issues.  When I called pt she had another question: Pt forgot to ask if it could cause eye infections? She had that as well. She said she had a chalazion. I told her that I did not believe that it would, but I did tell her I would ask you.  Dr Bluford Kaufmann answer: No, Reclast is not known to cause this.  Called pt and lvm advising her per Dr Arman Filter message. Advised pt to call back and let me know if she chooses to proceed forward after her lab results are back.

## 2014-09-14 NOTE — Telephone Encounter (Signed)
Called pt and advised her that we have faxed the form for the Reclast to [facility] and they will contact her with date and time. Pt requested the dates 7/12 or 7/13. Advised Short stay via fax.

## 2014-09-26 DIAGNOSIS — Z79899 Other long term (current) drug therapy: Secondary | ICD-10-CM | POA: Diagnosis not present

## 2014-09-26 DIAGNOSIS — D1801 Hemangioma of skin and subcutaneous tissue: Secondary | ICD-10-CM | POA: Diagnosis not present

## 2014-09-26 DIAGNOSIS — L853 Xerosis cutis: Secondary | ICD-10-CM | POA: Diagnosis not present

## 2014-09-26 DIAGNOSIS — L720 Epidermal cyst: Secondary | ICD-10-CM | POA: Diagnosis not present

## 2014-09-26 DIAGNOSIS — Z85828 Personal history of other malignant neoplasm of skin: Secondary | ICD-10-CM | POA: Diagnosis not present

## 2014-09-26 DIAGNOSIS — L308 Other specified dermatitis: Secondary | ICD-10-CM | POA: Diagnosis not present

## 2014-09-26 DIAGNOSIS — L570 Actinic keratosis: Secondary | ICD-10-CM | POA: Diagnosis not present

## 2014-09-26 DIAGNOSIS — B351 Tinea unguium: Secondary | ICD-10-CM | POA: Diagnosis not present

## 2014-09-26 DIAGNOSIS — L814 Other melanin hyperpigmentation: Secondary | ICD-10-CM | POA: Diagnosis not present

## 2014-09-26 DIAGNOSIS — D485 Neoplasm of uncertain behavior of skin: Secondary | ICD-10-CM | POA: Diagnosis not present

## 2014-09-26 DIAGNOSIS — L820 Inflamed seborrheic keratosis: Secondary | ICD-10-CM | POA: Diagnosis not present

## 2014-09-26 DIAGNOSIS — L821 Other seborrheic keratosis: Secondary | ICD-10-CM | POA: Diagnosis not present

## 2014-10-01 ENCOUNTER — Ambulatory Visit (HOSPITAL_COMMUNITY)
Admission: RE | Admit: 2014-10-01 | Discharge: 2014-10-01 | Disposition: A | Discharge status: Home / Self Care | Payer: Medicare Other | Source: Ambulatory Visit | Attending: Internal Medicine | Admitting: Internal Medicine

## 2014-10-22 DIAGNOSIS — D492 Neoplasm of unspecified behavior of bone, soft tissue, and skin: Secondary | ICD-10-CM | POA: Diagnosis not present

## 2014-10-22 DIAGNOSIS — D2230 Melanocytic nevi of unspecified part of face: Secondary | ICD-10-CM | POA: Diagnosis not present

## 2014-10-22 DIAGNOSIS — D485 Neoplasm of uncertain behavior of skin: Secondary | ICD-10-CM | POA: Diagnosis not present

## 2014-10-22 DIAGNOSIS — Z85828 Personal history of other malignant neoplasm of skin: Secondary | ICD-10-CM | POA: Diagnosis not present

## 2014-10-23 DIAGNOSIS — D485 Neoplasm of uncertain behavior of skin: Secondary | ICD-10-CM | POA: Diagnosis not present

## 2014-10-30 DIAGNOSIS — Z961 Presence of intraocular lens: Secondary | ICD-10-CM | POA: Diagnosis not present

## 2014-10-30 DIAGNOSIS — H04123 Dry eye syndrome of bilateral lacrimal glands: Secondary | ICD-10-CM | POA: Diagnosis not present

## 2014-11-30 DIAGNOSIS — L820 Inflamed seborrheic keratosis: Secondary | ICD-10-CM | POA: Diagnosis not present

## 2014-11-30 DIAGNOSIS — Z23 Encounter for immunization: Secondary | ICD-10-CM | POA: Diagnosis not present

## 2014-11-30 DIAGNOSIS — Z85828 Personal history of other malignant neoplasm of skin: Secondary | ICD-10-CM | POA: Diagnosis not present

## 2014-11-30 DIAGNOSIS — D485 Neoplasm of uncertain behavior of skin: Secondary | ICD-10-CM | POA: Diagnosis not present

## 2014-11-30 DIAGNOSIS — L570 Actinic keratosis: Secondary | ICD-10-CM | POA: Diagnosis not present

## 2014-12-29 ENCOUNTER — Other Ambulatory Visit: Payer: Self-pay | Admitting: Internal Medicine

## 2015-01-01 NOTE — Telephone Encounter (Signed)
Ok to refill x 1 90 # 

## 2015-01-02 NOTE — Telephone Encounter (Signed)
Faxed to the pharmacy.

## 2015-01-03 ENCOUNTER — Telehealth: Payer: Self-pay | Admitting: Internal Medicine

## 2015-01-03 NOTE — Telephone Encounter (Signed)
Pt request refill of the following:   clonazePAM (KLONOPIN) 0.5 MG tablet    Phamacy: [facility]

## 2015-01-03 NOTE — Telephone Encounter (Signed)
Faxed to the pharmacy on 01/02/15.  Please notify the pt.  Can take up to 24 hours at times for the fax to post.

## 2015-01-08 ENCOUNTER — Ambulatory Visit (INDEPENDENT_AMBULATORY_CARE_PROVIDER_SITE_OTHER): Payer: Medicare Other | Admitting: Sports Medicine

## 2015-01-08 ENCOUNTER — Encounter: Payer: Self-pay | Admitting: Sports Medicine

## 2015-01-08 VITALS — BP 110/72 | Ht 64.0 in | Wt 117.0 lb

## 2015-01-08 DIAGNOSIS — M21611 Bunion of right foot: Secondary | ICD-10-CM | POA: Diagnosis not present

## 2015-01-08 DIAGNOSIS — M7742 Metatarsalgia, left foot: Secondary | ICD-10-CM

## 2015-01-08 DIAGNOSIS — M21612 Bunion of left foot: Secondary | ICD-10-CM

## 2015-01-08 DIAGNOSIS — M7741 Metatarsalgia, right foot: Secondary | ICD-10-CM | POA: Diagnosis not present

## 2015-01-08 DIAGNOSIS — M25579 Pain in unspecified ankle and joints of unspecified foot: Secondary | ICD-10-CM

## 2015-01-08 NOTE — Assessment & Plan Note (Signed)
Small MT pads  Use these in any shoes for walking  Replace if needed

## 2015-01-08 NOTE — Assessment & Plan Note (Signed)
We added some bunion padding on RT  Try to unload pressure with custom orthotics - attempt to forestall surgery

## 2015-01-08 NOTE — Assessment & Plan Note (Signed)
This was improved greatly with orthotic support  Custom orthotics prepared today.  I spent 40 mins face to face one valuation of this patient to include per and post orthotic gait analysis with 50% of time in direct counselign about foot and ankle issues.

## 2015-01-08 NOTE — Progress Notes (Signed)
Subjective:    Patient ID: Jacqueline Burgess, female    DOB: 07/12/1940, 74 y.o.   MRN: [id number]  HPI  74 year old female presenting for orthotic evaluation/creation. She previously had TEMPORARY orthotics made which have lasted her 2.5 years. Her current level of activity is without limitation when wearing orthotics. For exercise she walks 50-75 minutes daily, typically covering 3-4 miles during that time. Whenever she does her extensive walking/exercise with orthotics inside her walking shoes she is symptom-free.  If she is not using temp orthotic support she gets return of pain in both forefeet  She made her appointment today because her orthotics feel like they're starting to wear out. 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  Bunions seem to have stabilized since starting in sports insoles    PMHx: -- Reviewed  PSHx: -- Reviewed  Meds: -- Reviewed  Allergies: -- Reviewed  Social Hx: -- Reviewed  Review of Systems   Mild bunion pain bilat./ no generalized joint swelling/ ankle pain when not using support/ no foot swelling    10-Point ROS was negative other than above in HPI  Objective:   Physical Exam  General: -- Well appearing and in NAD; resting comfortably on exam table BP 110/72 mmHg  Ht 5\' 4"  (1.626 m)  Wt 117 lb (53.071 kg)  BMI 20.07 kg/m2  Cardiopulmonary: -- Non-labored respirations -- No JVD; 2+ pulses in distal lower extremities  Neurology: -- No focal deficits on interview/exam  Psych: -- Appropriate mood/affect; normal thought content  Lower Extremity exam: -- No valgus/varus deformity of the bilateral knees/legs. -With standing examination of the bilateral feet there is collapse of the forefeet and mild pes planus. She has recuperation of her arch when she is nonweightbearing. -There is significant hallux valgus deformity of the bilateral first  digits/MTP, right greater than than left. -She has bilateral hammering of the fourth and fifth toes bilaterally, right greater than left -No evidence of posterior tibialis dysfunction with heel raise -There is 5/5 strength in bilateral ankle dorsiflexion and plantarflexion. 5/5 strength in great toe flexion and extension. -Negative Tinel's sign in the bilateral tarsal tunnels -No pain to palpation of the metatarsal heads bilaterally but significant collapse/callus formation on both sides   Orthotic PROCEDURE NOTE:  Patient was fitted for a standard, cushioned, semi-rigid orthotic.  The orthotic was heated and the patient stood on the orthotic blank positioned on the orthotic stand. The patient was positioned in subtalar neutral position and 10 degrees of ankle dorsiflexion in a weight bearing stance. After molding, a stable [employer] EVA base was applied to the orthotic blank.   The blank was ground to a stable position for weight bearing. Size:6 Red EVA blank base: Blue EVA posting: A 1st MTP joint pad was placed distal on the RIGHT foot additional orthotic padding: Bilateral metarsal pads were placed in addition to the 1st MTP joint pad   Post-orthotic Gait analysis: -- Patient now lands in a neutral position -Significant weight bearing has been removed from the first MTP when compared to the patient's gait prior to insertion orthosis -There is no valgus or varus deformity of the legs when ambulating -There is very mild external rotation of the leg bilaterally, but symmetric -Patient ambulates at her normal walking pace without difficulty or pain     Assessment & Plan:   74 year old female presented for a evaluation of gait and orthotic revision/creation of new orthotics  #  1. Bilateral pes planus, forefoot collapse and hallux valgus deformity New permanent orthotics were created for the patient in an effort to further protect her forefoot collapse/provide comfort with ambulation and  also prevent further hallux valgus deformity progression in her bilateral first MTP joints. Orthotics made as detailed above in the procedure note. Patient ambulated without pain and with improvement in gait after orthotic creation.  Patient to follow-up as needed only for any corrections to the orthotics or for any acute issues that may arise. Otherwise follow-up when new orthotics are wearing out/not providing the support she desires anymore

## 2015-03-05 DIAGNOSIS — M25511 Pain in right shoulder: Secondary | ICD-10-CM | POA: Diagnosis not present

## 2015-03-10 HISTORY — PX: MOHS SURGERY: SUR867

## 2015-03-18 ENCOUNTER — Encounter: Payer: Medicare Other | Admitting: Internal Medicine

## 2015-04-29 DIAGNOSIS — Z961 Presence of intraocular lens: Secondary | ICD-10-CM | POA: Diagnosis not present

## 2015-04-29 DIAGNOSIS — H43813 Vitreous degeneration, bilateral: Secondary | ICD-10-CM | POA: Diagnosis not present

## 2015-04-29 DIAGNOSIS — H04123 Dry eye syndrome of bilateral lacrimal glands: Secondary | ICD-10-CM | POA: Diagnosis not present

## 2015-05-08 ENCOUNTER — Ambulatory Visit (INDEPENDENT_AMBULATORY_CARE_PROVIDER_SITE_OTHER): Payer: Medicare Other | Admitting: Internal Medicine

## 2015-05-08 ENCOUNTER — Encounter: Payer: Self-pay | Admitting: Internal Medicine

## 2015-05-08 VITALS — BP 114/76 | Temp 98.0°F | Ht 63.5 in | Wt 119.3 lb

## 2015-05-08 DIAGNOSIS — Z974 Presence of external hearing-aid: Secondary | ICD-10-CM | POA: Diagnosis not present

## 2015-05-08 DIAGNOSIS — M810 Age-related osteoporosis without current pathological fracture: Secondary | ICD-10-CM

## 2015-05-08 DIAGNOSIS — Z79899 Other long term (current) drug therapy: Secondary | ICD-10-CM | POA: Diagnosis not present

## 2015-05-08 DIAGNOSIS — E785 Hyperlipidemia, unspecified: Secondary | ICD-10-CM | POA: Diagnosis not present

## 2015-05-08 DIAGNOSIS — G4700 Insomnia, unspecified: Secondary | ICD-10-CM

## 2015-05-08 DIAGNOSIS — Z0000 Encounter for general adult medical examination without abnormal findings: Secondary | ICD-10-CM

## 2015-05-08 LAB — CBC WITH DIFFERENTIAL/PLATELET
BASOS ABS: 0 10*3/uL (ref 0.0–0.1)
Basophils Relative: 0.4 % (ref 0.0–3.0)
EOS ABS: 0.1 10*3/uL (ref 0.0–0.7)
Eosinophils Relative: 1.7 % (ref 0.0–5.0)
HEMATOCRIT: 45.4 % (ref 36.0–46.0)
HEMOGLOBIN: 15.2 g/dL — AB (ref 12.0–15.0)
LYMPHS PCT: 23.9 % (ref 12.0–46.0)
Lymphs Abs: 1.7 10*3/uL (ref 0.7–4.0)
MCHC: 33.5 g/dL (ref 30.0–36.0)
MCV: 91.6 fl (ref 78.0–100.0)
MONO ABS: 0.5 10*3/uL (ref 0.1–1.0)
Monocytes Relative: 7.5 % (ref 3.0–12.0)
NEUTROS ABS: 4.8 10*3/uL (ref 1.4–7.7)
Neutrophils Relative %: 66.5 % (ref 43.0–77.0)
PLATELETS: 178 10*3/uL (ref 150.0–400.0)
RBC: 4.96 Mil/uL (ref 3.87–5.11)
RDW: 14 % (ref 11.5–15.5)
WBC: 7.2 10*3/uL (ref 4.0–10.5)

## 2015-05-08 LAB — BASIC METABOLIC PANEL
BUN: 17 mg/dL (ref 6–23)
CALCIUM: 9.8 mg/dL (ref 8.4–10.5)
CO2: 28 mEq/L (ref 19–32)
CREATININE: 0.77 mg/dL (ref 0.40–1.20)
Chloride: 98 mEq/L (ref 96–112)
GFR: 77.34 mL/min (ref >60.00)
GLUCOSE: 99 mg/dL (ref 70–99)
Potassium: 4.7 mEq/L (ref 3.5–5.1)
Sodium: 136 mEq/L (ref 135–145)

## 2015-05-08 LAB — LIPID PANEL
CHOL/HDL RATIO: 3
CHOLESTEROL: 240 mg/dL — AB (ref 0–200)
HDL: 69.6 mg/dL (ref >39.00)
LDL Cholesterol: 146 mg/dL — ABNORMAL HIGH (ref 0–99)
NonHDL: 169.92
TRIGLYCERIDES: 119 mg/dL (ref 0.0–149.0)
VLDL: 23.8 mg/dL (ref 0.0–40.0)

## 2015-05-08 NOTE — Patient Instructions (Addendum)
Get appt back with dr Cruzita Lederer  About the   Osteoporosis   ? Best options  for you .  High risk medications    Can  Contribute to falling and cognition problems  .consdier weaning to lower dose and use with caution Pay attention to sleep hygeien and  Anti anxiety  Maneurvers. No alcohol with medication  Stop the methocarbamol  And only use as needed. Tox screen today Will notify you  of labs when available.   LEt us know if you had PNEUMOVAX 23 at  Another site if not come for inj only    Health Maintenance, Female Adopting a healthy lifestyle and getting preventive care can go a long way to promote health and wellness. Talk with your health care provider about what schedule of regular examinations is right for you. This is a good chance for you to check in with your provider about disease prevention and staying healthy. In between checkups, there are plenty of things you can do on your own. Experts have done a lot of research about which lifestyle changes and preventive measures are most likely to keep you healthy. Ask your health care provider for more information. WEIGHT AND DIET  Eat a healthy diet  Be sure to include plenty of vegetables, fruits, low-fat dairy products, and lean protein.  Do not eat a lot of foods high in solid fats, added sugars, or salt.  Get regular exercise. This is one of the most important things you can do for your health.  Most adults should exercise for at least 150 minutes each week. The exercise should increase your heart rate and make you sweat (moderate-intensity exercise).  Most adults should also do strengthening exercises at least twice a week. This is in addition to the moderate-intensity exercise.  Maintain a healthy weight  Body mass index (BMI) is a measurement that can be used to identify possible weight problems. It estimates body fat based on height and weight. Your health care provider can help determine your BMI and help you achieve or  maintain a healthy weight.  For females 9 years of age and older:   A BMI below 18.5 is considered underweight.  A BMI of 18.5 to 24.9 is normal.  A BMI of 25 to 29.9 is considered overweight.  A BMI of 30 and above is considered obese.  Watch levels of cholesterol and blood lipids  You should start having your blood tested for lipids and cholesterol at 77 years of age, then have this test every 5 years.  You may need to have your cholesterol levels checked more often if:  Your lipid or cholesterol levels are high.  You are older than 77 years of age.  You are at high risk for heart disease.  CANCER SCREENING   Lung Cancer  Lung cancer screening is recommended for adults 63-22 years old who are at high risk for lung cancer because of a history of smoking.  A yearly low-dose CT scan of the lungs is recommended for people who:  Currently smoke.  Have quit within the past 15 years.  Have at least a 30-pack-year history of smoking. A pack year is smoking an average of one pack of cigarettes a day for 1 year.  Yearly screening should continue until it has been 15 years since you quit.  Yearly screening should stop if you develop a health problem that would prevent you from having lung cancer treatment.  Breast Cancer  Practice breast self-awareness. This  means understanding how your breasts normally appear and feel.  It also means doing regular breast self-exams. Let your health care provider know about any changes, no matter how small.  If you are in your 20s or 30s, you should have a clinical breast exam (CBE) by a health care provider every 1-3 years as part of a regular health exam.  If you are 70 or older, have a CBE every year. Also consider having a breast X-ray (mammogram) every year.  If you have a family history of breast cancer, talk to your health care provider about genetic screening.  If you are at high risk for breast cancer, talk to your health care  provider about having an MRI and a mammogram every year.  Breast cancer gene (BRCA) assessment is recommended for women who have family members with BRCA-related cancers. BRCA-related cancers include:  Breast.  Ovarian.  Tubal.  Peritoneal cancers.  Results of the assessment will determine the need for genetic counseling and BRCA1 and BRCA2 testing. Cervical Cancer Your health care provider may recommend that you be screened regularly for cancer of the pelvic organs (ovaries, uterus, and vagina). This screening involves a pelvic examination, including checking for microscopic changes to the surface of your cervix (Pap test). You may be encouraged to have this screening done every 3 years, beginning at age 50.  For women ages 95-65, health care providers may recommend pelvic exams and Pap testing every 3 years, or they may recommend the Pap and pelvic exam, combined with testing for human papilloma virus (HPV), every 5 years. Some types of HPV increase your risk of cervical cancer. Testing for HPV may also be done on women of any age with unclear Pap test results.  Other health care providers may not recommend any screening for nonpregnant women who are considered low risk for pelvic cancer and who do not have symptoms. Ask your health care provider if a screening pelvic exam is right for you.  If you have had past treatment for cervical cancer or a condition that could lead to cancer, you need Pap tests and screening for cancer for at least 20 years after your treatment. If Pap tests have been discontinued, your risk factors (such as having a new sexual partner) need to be reassessed to determine if screening should resume. Some women have medical problems that increase the chance of getting cervical cancer. In these cases, your health care provider may recommend more frequent screening and Pap tests. Colorectal Cancer  This type of cancer can be detected and often prevented.  Routine  colorectal cancer screening usually begins at 77 years of age and continues through 77 years of age.  Your health care provider may recommend screening at an earlier age if you have risk factors for colon cancer.  Your health care provider may also recommend using home test kits to check for hidden blood in the stool.  A small camera at the end of a tube can be used to examine your colon directly (sigmoidoscopy or colonoscopy). This is done to check for the earliest forms of colorectal cancer.  Routine screening usually begins at age 72.  Direct examination of the colon should be repeated every 5-10 years through 77 years of age. However, you may need to be screened more often if early forms of precancerous polyps or small growths are found. Skin Cancer  Check your skin from head to toe regularly.  Tell your health care provider about any new moles or changes  in moles, especially if there is a change in a mole's shape or color.  Also tell your health care provider if you have a mole that is larger than the size of a pencil eraser.  Always use sunscreen. Apply sunscreen liberally and repeatedly throughout the day.  Protect yourself by wearing long sleeves, pants, a wide-brimmed hat, and sunglasses whenever you are outside. HEART DISEASE, DIABETES, AND HIGH BLOOD PRESSURE   High blood pressure causes heart disease and increases the risk of stroke. High blood pressure is more likely to develop in:  People who have blood pressure in the high end of the normal range (130-139/85-89 mm Hg).  People who are overweight or obese.  People who are African American.  If you are 30-85 years of age, have your blood pressure checked every 3-5 years. If you are 2 years of age or older, have your blood pressure checked every year. You should have your blood pressure measured twice--once when you are at a hospital or clinic, and once when you are not at a hospital or clinic. Record the average of the  two measurements. To check your blood pressure when you are not at a hospital or clinic, you can use:  An automated blood pressure machine at a pharmacy.  A home blood pressure monitor.  If you are between 50 years and 40 years old, ask your health care provider if you should take aspirin to prevent strokes.  Have regular diabetes screenings. This involves taking a blood sample to check your fasting blood sugar level.  If you are at a normal weight and have a low risk for diabetes, have this test once every three years after 77 years of age.  If you are overweight and have a high risk for diabetes, consider being tested at a younger age or more often. PREVENTING INFECTION  Hepatitis B  If you have a higher risk for hepatitis B, you should be screened for this virus. You are considered at high risk for hepatitis B if:  You were born in a country where hepatitis B is common. Ask your health care provider which countries are considered high risk.  Your parents were born in a high-risk country, and you have not been immunized against hepatitis B (hepatitis B vaccine).  You have HIV or AIDS.  You use needles to inject street drugs.  You live with someone who has hepatitis B.  You have had sex with someone who has hepatitis B.  You get hemodialysis treatment.  You take certain medicines for conditions, including cancer, organ transplantation, and autoimmune conditions. Hepatitis C  Blood testing is recommended for:  Everyone born from [birth year] through 1967.  Anyone with known risk factors for hepatitis C. Sexually transmitted infections (STIs)  You should be screened for sexually transmitted infections (STIs) including gonorrhea and chlamydia if:  You are sexually active and are younger than 77 years of age.  You are older than 77 years of age and your health care provider tells you that you are at risk for this type of infection.  Your sexual activity has changed since you were  last screened and you are at an increased risk for chlamydia or gonorrhea. Ask your health care provider if you are at risk.  If you do not have HIV, but are at risk, it may be recommended that you take a prescription medicine daily to prevent HIV infection. This is called pre-exposure prophylaxis (PrEP). You are considered at risk if:  You are  sexually active and do not regularly use condoms or know the HIV status of your partner(s).  You take drugs by injection.  You are sexually active with a partner who has HIV. Talk with your health care provider about whether you are at high risk of being infected with HIV. If you choose to begin PrEP, you should first be tested for HIV. You should then be tested every 3 months for as long as you are taking PrEP.  PREGNANCY   If you are premenopausal and you may become pregnant, ask your health care provider about preconception counseling.  If you may become pregnant, take 400 to 800 micrograms (mcg) of folic acid every day.  If you want to prevent pregnancy, talk to your health care provider about birth control (contraception). OSTEOPOROSIS AND MENOPAUSE   Osteoporosis is a disease in which the bones lose minerals and strength with aging. This can result in serious bone fractures. Your risk for osteoporosis can be identified using a bone density scan.  If you are 74 years of age or older, or if you are at risk for osteoporosis and fractures, ask your health care provider if you should be screened.  Ask your health care provider whether you should take a calcium or vitamin D supplement to lower your risk for osteoporosis.  Menopause may have certain physical symptoms and risks.  Hormone replacement therapy may reduce some of these symptoms and risks. Talk to your health care provider about whether hormone replacement therapy is right for you.  HOME CARE INSTRUCTIONS   Schedule regular health, dental, and eye exams.  Stay current with your  immunizations.   Do not use any tobacco products including cigarettes, chewing tobacco, or electronic cigarettes.  If you are pregnant, do not drink alcohol.  If you are breastfeeding, limit how much and how often you drink alcohol.  Limit alcohol intake to no more than 1 drink per day for nonpregnant women. One drink equals 12 ounces of beer, 5 ounces of wine, or 1 ounces of hard liquor.  Do not use street drugs.  Do not share needles.  Ask your health care provider for help if you need support or information about quitting drugs.  Tell your health care provider if you often feel depressed.  Tell your health care provider if you have ever been abused or do not feel safe at home.   This information is not intended to replace advice given to you by your health care provider. Make sure you discuss any questions you have with your health care provider.   Document Released: 09/08/2010 Document Revised: 03/16/2014 Document Reviewed: 01/25/2013 Elsevier Interactive Patient Education 2016 [place] Maintenance, Female Adopting a healthy lifestyle and getting preventive care can go a long way to promote health and wellness. Talk with your health care provider about what schedule of regular examinations is right for you. This is a good chance for you to check in with your provider about disease prevention and staying healthy. In between checkups, there are plenty of things you can do on your own. Experts have done a lot of research about which lifestyle changes and preventive measures are most likely to keep you healthy. Ask your health care provider for more information. WEIGHT AND DIET  Eat a healthy diet  Be sure to include plenty of vegetables, fruits, low-fat dairy products, and lean protein.  Do not eat a lot of foods high  in solid fats, added sugars, or salt.  Get regular exercise. This is one of the most important things you can do for your  health.  Most adults should exercise for at least 150 minutes each week. The exercise should increase your heart rate and make you sweat (moderate-intensity exercise).  Most adults should also do strengthening exercises at least twice a week. This is in addition to the moderate-intensity exercise.  Maintain a healthy weight  Body mass index (BMI) is a measurement that can be used to identify possible weight problems. It estimates body fat based on height and weight. Your health care provider can help determine your BMI and help you achieve or maintain a healthy weight.  For females 65 years of age and older:   A BMI below 18.5 is considered underweight.  A BMI of 18.5 to 24.9 is normal.  A BMI of 25 to 29.9 is considered overweight.  A BMI of 30 and above is considered obese.  Watch levels of cholesterol and blood lipids  You should start having your blood tested for lipids and cholesterol at 77 years of age, then have this test every 5 years.  You may need to have your cholesterol levels checked more often if:  Your lipid or cholesterol levels are high.  You are older than 77 years of age.  You are at high risk for heart disease.  CANCER SCREENING   Lung Cancer  Lung cancer screening is recommended for adults 47-24 years old who are at high risk for lung cancer because of a history of smoking.  A yearly low-dose CT scan of the lungs is recommended for people who:  Currently smoke.  Have quit within the past 15 years.  Have at least a 30-pack-year history of smoking. A pack year is smoking an average of one pack of cigarettes a day for 1 year.  Yearly screening should continue until it has been 15 years since you quit.  Yearly screening should stop if you develop a health problem that would prevent you from having lung cancer treatment.  Breast Cancer  Practice breast self-awareness. This means understanding how your breasts normally appear and feel.  It also  means doing regular breast self-exams. Let your health care provider know about any changes, no matter how small.  If you are in your 20s or 30s, you should have a clinical breast exam (CBE) by a health care provider every 1-3 years as part of a regular health exam.  If you are 58 or older, have a CBE every year. Also consider having a breast X-ray (mammogram) every year.  If you have a family history of breast cancer, talk to your health care provider about genetic screening.  If you are at high risk for breast cancer, talk to your health care provider about having an MRI and a mammogram every year.  Breast cancer gene (BRCA) assessment is recommended for women who have family members with BRCA-related cancers. BRCA-related cancers include:  Breast.  Ovarian.  Tubal.  Peritoneal cancers.  Results of the assessment will determine the need for genetic counseling and BRCA1 and BRCA2 testing. Cervical Cancer Your health care provider may recommend that you be screened regularly for cancer of the pelvic organs (ovaries, uterus, and vagina). This screening involves a pelvic examination, including checking for microscopic changes to the surface of your cervix (Pap test). You may be encouraged to have this screening done every 3 years, beginning at age 40.  For women ages  30-65, health care providers may recommend pelvic exams and Pap testing every 3 years, or they may recommend the Pap and pelvic exam, combined with testing for human papilloma virus (HPV), every 5 years. Some types of HPV increase your risk of cervical cancer. Testing for HPV may also be done on women of any age with unclear Pap test results.  Other health care providers may not recommend any screening for nonpregnant women who are considered low risk for pelvic cancer and who do not have symptoms. Ask your health care provider if a screening pelvic exam is right for you.  If you have had past treatment for cervical cancer or a  condition that could lead to cancer, you need Pap tests and screening for cancer for at least 20 years after your treatment. If Pap tests have been discontinued, your risk factors (such as having a new sexual partner) need to be reassessed to determine if screening should resume. Some women have medical problems that increase the chance of getting cervical cancer. In these cases, your health care provider may recommend more frequent screening and Pap tests. Colorectal Cancer  This type of cancer can be detected and often prevented.  Routine colorectal cancer screening usually begins at 77 years of age and continues through 77 years of age.  Your health care provider may recommend screening at an earlier age if you have risk factors for colon cancer.  Your health care provider may also recommend using home test kits to check for hidden blood in the stool.  A small camera at the end of a tube can be used to examine your colon directly (sigmoidoscopy or colonoscopy). This is done to check for the earliest forms of colorectal cancer.  Routine screening usually begins at age 34.  Direct examination of the colon should be repeated every 5-10 years through 77 years of age. However, you may need to be screened more often if early forms of precancerous polyps or small growths are found. Skin Cancer  Check your skin from head to toe regularly.  Tell your health care provider about any new moles or changes in moles, especially if there is a change in a mole's shape or color.  Also tell your health care provider if you have a mole that is larger than the size of a pencil eraser.  Always use sunscreen. Apply sunscreen liberally and repeatedly throughout the day.  Protect yourself by wearing long sleeves, pants, a wide-brimmed hat, and sunglasses whenever you are outside. HEART DISEASE, DIABETES, AND HIGH BLOOD PRESSURE   High blood pressure causes heart disease and increases the risk of stroke. High  blood pressure is more likely to develop in:  People who have blood pressure in the high end of the normal range (130-139/85-89 mm Hg).  People who are overweight or obese.  People who are African American.  If you are 81-65 years of age, have your blood pressure checked every 3-5 years. If you are 16 years of age or older, have your blood pressure checked every year. You should have your blood pressure measured twice--once when you are at a hospital or clinic, and once when you are not at a hospital or clinic. Record the average of the two measurements. To check your blood pressure when you are not at a hospital or clinic, you can use:  An automated blood pressure machine at a pharmacy.  A home blood pressure monitor.  If you are between 42 years and 33 years old, ask  your health care provider if you should take aspirin to prevent strokes.  Have regular diabetes screenings. This involves taking a blood sample to check your fasting blood sugar level.  If you are at a normal weight and have a low risk for diabetes, have this test once every three years after 77 years of age.  If you are overweight and have a high risk for diabetes, consider being tested at a younger age or more often. PREVENTING INFECTION  Hepatitis B  If you have a higher risk for hepatitis B, you should be screened for this virus. You are considered at high risk for hepatitis B if:  You were born in a country where hepatitis B is common. Ask your health care provider which countries are considered high risk.  Your parents were born in a high-risk country, and you have not been immunized against hepatitis B (hepatitis B vaccine).  You have HIV or AIDS.  You use needles to inject street drugs.  You live with someone who has hepatitis B.  You have had sex with someone who has hepatitis B.  You get hemodialysis treatment.  You take certain medicines for conditions, including cancer, organ transplantation, and  autoimmune conditions. Hepatitis C  Blood testing is recommended for:  Everyone born from [birth year] through 1967.  Anyone with known risk factors for hepatitis C. Sexually transmitted infections (STIs)  You should be screened for sexually transmitted infections (STIs) including gonorrhea and chlamydia if:  You are sexually active and are younger than 77 years of age.  You are older than 77 years of age and your health care provider tells you that you are at risk for this type of infection.  Your sexual activity has changed since you were last screened and you are at an increased risk for chlamydia or gonorrhea. Ask your health care provider if you are at risk.  If you do not have HIV, but are at risk, it may be recommended that you take a prescription medicine daily to prevent HIV infection. This is called pre-exposure prophylaxis (PrEP). You are considered at risk if:  You are sexually active and do not regularly use condoms or know the HIV status of your partner(s).  You take drugs by injection.  You are sexually active with a partner who has HIV. Talk with your health care provider about whether you are at high risk of being infected with HIV. If you choose to begin PrEP, you should first be tested for HIV. You should then be tested every 3 months for as long as you are taking PrEP.  PREGNANCY   If you are premenopausal and you may become pregnant, ask your health care provider about preconception counseling.  If you may become pregnant, take 400 to 800 micrograms (mcg) of folic acid every day.  If you want to prevent pregnancy, talk to your health care provider about birth control (contraception). OSTEOPOROSIS AND MENOPAUSE   Osteoporosis is a disease in which the bones lose minerals and strength with aging. This can result in serious bone fractures. Your risk for osteoporosis can be identified using a bone density scan.  If you are 10 years of age or older, or if you are at risk  for osteoporosis and fractures, ask your health care provider if you should be screened.  Ask your health care provider whether you should take a calcium or vitamin D supplement to lower your risk for osteoporosis.  Menopause may have certain physical symptoms and risks.  Hormone replacement therapy may reduce some of these symptoms and risks. Talk to your health care provider about whether hormone replacement therapy is right for you.  HOME CARE INSTRUCTIONS   Schedule regular health, dental, and eye exams.  Stay current with your immunizations.   Do not use any tobacco products including cigarettes, chewing tobacco, or electronic cigarettes.  If you are pregnant, do not drink alcohol.  If you are breastfeeding, limit how much and how often you drink alcohol.  Limit alcohol intake to no more than 1 drink per day for nonpregnant women. One drink equals 12 ounces of beer, 5 ounces of wine, or 1 ounces of hard liquor.  Do not use street drugs.  Do not share needles.  Ask your health care provider for help if you need support or information about quitting drugs.  Tell your health care provider if you often feel depressed.  Tell your health care provider if you have ever been abused or do not feel safe at home.   This information is not intended to replace advice given to you by your health care provider. Make sure you discuss any questions you have with your health care provider.   Document Released: 09/08/2010 Document Revised: 03/16/2014 Document Reviewed: 01/25/2013 [employer] Interactive Patient Education [employer].

## 2015-05-08 NOTE — Progress Notes (Signed)
Pre visit review using our clinic review tool, if applicable. No additional management support is needed unless otherwise documented below in the visit note.  Chief Complaint  Patient presents with  . Medicare Wellness    medications    HPI: Jacqueline Burgess 75 y.o. comes in today for Preventive Medicare wellness visit .Since last visit. Had skin bx scca  Has hearing aids ol uses sometimes  ? If  Needs wx inear removed  And med management taking elavil and clonipen for years helps sleep and has added 1/4 robaxin left ober from her injury  " sleeps well"  Takes around 9 pm wathces tv or other and reitres at 10 30  No falling grogginess  . Thinks does well. On no rx med for osteoporosis   Got rashes and eye infections  And better since off  hasnt been back to dr G yet.  Health Maintenance  Topic Date Due  . PNA vac Low Risk Adult (2 of 2 - PPSV23) 03/14/2014  . COLONOSCOPY  07/07/2015  . INFLUENZA VACCINE  10/08/2015  . TETANUS/TDAP  03/13/2024  . DEXA SCAN  Completed  . ZOSTAVAX  Addressed   Health Maintenance Review LIFESTYLE:  TAD oc etoh no td  Sugar beverages: Sleep:good with med see above    MEDICARE DOCUMENT QUESTIONS  TO SCAN     Hearing:  Aids   Vision:  No limitations at present . Last eye check UTD  Safety:  Has smoke detector and wears seat belts.  No firearms. No excess sun exposure. Sees dentist regularly.  Falls: n  Advance directive :  Reviewed  Has one.  Memory: Felt to be good  , no concern from her or her family.  Depression: No anhedonia unusual crying or depressive symptoms  Nutrition: Eats well balanced diet; adequate calcium and vitamin D. No swallowing chewing problems.  Injury: no major injuries in the last six months.  Other healthcare providers:  Reviewed today .  Social:  Lives with spouse married. No pets.   Preventive parameters: up-to-date  Reviewed   ADLS:   There are no problems or need for assistance  driving, feeding,  obtaining food, dressing, toileting and bathing, managing money using phone. She is independent.    ROS:  GEN/ HEENT: No fever, significant weight changes sweats headaches vision problems hearing changes, CV/ PULM; No chest pain shortness of breath cough, syncope,edema  change in exercise tolerance. GI /GU: No adominal pain, vomiting, change in bowel habits. No blood in the stool. No significant GU symptoms. SKIN/HEME: ,no acute skin rashes suspicious lesions or bleeding. No lymphadenopathy, nodules, masses.  NEURO/ PSYCH:  No neurologic signs such as weakness numbness. No depression anxiety. IMM/ Allergy: No unusual infections.  Allergy .   REST of 12 system review negative except as per HPI   Past Medical History  Diagnosis Date  . Arthritis   . Cataract     lens implant  . Osteoporosis     eval dr Buddy Duty in past dexa 11 12   . Kidney stones 1967  . Colon polyps   . Closed pelvic fracture ([place]) 2012    pelvis and shoulder   . Hx of fall     fracture pelvis and left shoulder humerus  . Hx of nonmelanoma skin cancer     ssca  . MVP (mitral valve prolapse)     in records  no echo eval in records  . Hx of syncope     had neg  event monitor per dr Marya Amsler taylor in 2010  . 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 CVA Mother     Died in her 95M complications rheumatic  fever afterwards when she got a stroke  . Lung cancer Sister     Died in 16s was a smoker  . Rheumatic fever Mother   . Thyroid cancer Daughter     Papillary    Social History   Social History  . Marital Status: Married    Spouse Name: N/A  . Number of Children: N/A  . Years of Education: N/A   Social History Main Topics  . Smoking status: Never Smoker   . Smokeless tobacco: None  . Alcohol Use: 1.5 oz/week    3 drink(s) per week  . Drug Use: No  . Sexual Activity:    Partners: Female   Other Topics Concern  . None   Social History Narrative     Married retired Pharmacist, 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   g4 P4   Regular exercise: walks daily   Caffeine use: 2 cups of coffee daily          Outpatient Encounter Prescriptions as of 05/08/2015  Medication Sig  . amitriptyline (ELAVIL) 10 MG tablet TAKE 1 TABLET  EVERY  NIGHT AT BEDTIME  . betamethasone dipropionate (DIPROLENE) 0.05 % cream Apply 1 application topically 2 (two) times daily.  . Calcium Carbonate-Vitamin D (CALCIUM-VITAMIN D) 500-200 MG-UNIT per tablet Take 1 tablet by mouth 2 (two) times daily with a meal.    . clonazePAM (KLONOPIN) 0.5 MG tablet TAKE 1 TABLET AT BEDTIME AS NEEDED  . glucosamine-chondroitin 500-400 MG tablet Take 1 tablet by mouth daily.    . methocarbamol (ROBAXIN) 500 MG tablet Take 500 mg by mouth. Using about 3 times weekly.  . Multiple Vitamins-Minerals (MULTIVITAL) tablet Take 1 tablet by mouth daily.    Marland Kitchen tretinoin (RETIN-A) 0.025 % cream   . [DISCONTINUED] doxycycline (VIBRAMYCIN) 100 MG capsule   . [DISCONTINUED] FLUZONE HIGH-DOSE 0.5 ML SUSY ADM 0.5ML IM UTD  . [DISCONTINUED] VITAMIN D, ERGOCALCIFEROL, PO Take by mouth.     No facility-administered encounter medications on file as of 05/08/2015.    EXAM:  BP 114/76 mmHg  Temp(Src) 98 F (36.7 C) (Oral)  Ht 5' 3.5" (1.613 m)  Wt 119 lb 4.8 oz (54.114 kg)  BMI 20.80 kg/m2  Body mass index is 20.8 kg/(m^2).  Physical Exam: Vital signs reviewed WUX:LKGM is a well-developed well-nourished alert cooperative   who appears stated age in no acute distress.  HEENT: normocephalic atraumatic , Eyes: PERRL EOM's full, conjunctiva clear, Nares: paten,t no deformity discharge or tenderness., Ears: no deformity EAC's lclear 2+ right waxTMs with normal landmarks. Mouth: clear OP, no lesions, edema.  Moist mucous membranes. Dentition in adequate  repair. NECK: supple without masses, thyromegaly or bruits. CHEST/PULM:  Clear to auscultation and percussion breath sounds equal no wheeze , rales or rhonchi. No chest wall deformities or tenderness.Breast: normal by inspection . No dimpling, discharge, masses, tenderness or discharge . CV: PMI is nondisplaced, S1 S2 no gallops, murmurs, rubs. Peripheral pulses are full without delay.No JVD .  ABDOMEN: Bowel sounds normal nontender  No guard or rebound, no hepato splenomegal  no CVA tenderness.   Extremtities:  No clubbing cyanosis or edema, no acute joint swelling or redness no focal atrophy some djd changes  NEURO:  Oriented x3, cranial nerves 3-12 appear to be intact, no obvious focal weakness,gait within normal limits no abnormal reflexes or asymmetrical SKIN: No acute rashes normal turgor, color, no bruising or petechiae.   Sun damaged  aks  Except face.  PSYCH: Oriented, good eye contact, no obvious depression anxiety, cognition and judgment appear normal. LN: no cervical axillary inguinal adenopathy No noted deficits in memory, attention, and speech.   Lab Results  Component Value Date   WBC 7.2 05/08/2015   HGB 15.2* 05/08/2015   HCT 45.4 05/08/2015   PLT 178.0 05/08/2015   GLUCOSE 99 05/08/2015   CHOL 240* 05/08/2015   TRIG 119.0 05/08/2015   HDL 69.60 05/08/2015   LDLDIRECT 136.8 03/14/2013   LDLCALC 146* 05/08/2015   ALT 19 02/26/2012   AST 24 02/26/2012   NA 136 05/08/2015   K 4.7 05/08/2015   CL 98 05/08/2015   CREATININE 0.77 05/08/2015   BUN 17 05/08/2015   CO2 28 05/08/2015   TSH 0.43 03/14/2013   INR 0.98 11/11/2010    ASSESSMENT AND PLAN:  Discussed the following assessment and plan:  Medicare annual wellness visit, subsequent  Insomnia - reviewed   stop  methocarbamol use as needed - Plan: Basic metabolic panel, CBC with Differential/Platelet, Lipid panel  Medication management - Plan: Basic metabolic panel, CBC with Differential/Platelet, Lipid  panel  Osteoporosis - Plan: Basic metabolic panel, CBC with Differential/Platelet, Lipid panel  Wears hearing aid  High risk medication use - Plan: Basic metabolic panel, CBC with Differential/Platelet, Lipid panel  HLD (hyperlipidemia) - Plan: Basic metabolic panel, CBC with Differential/Platelet, Lipid panel   Some wax .  Right ear  Can return for clear out if needed Get back with   Dr :   Renne Crigler  ? [facility]   Still has significant osteoporosis by history and history of fracture. She may choose to not add medicine at this time. Uncertain if early at cause the side effects. Reviewed her medications she is aware of the risk would not have her take 3 different medicines at night don't take the methocarbamol she's been on amitriptyline in the Klonopin for years so we'll continue at this time. Counseled again is aware at this point benefit appears to be more than risk.  Pneumovax and tox screen today. In addition to blood work. Counseled. Regarding medication  Above and below  Patient Care Team: Burnis Medin, MD as PCP - General (Internal Medicine) Paula Compton, MD as Attending Physician (Obstetrics and Gynecology) Marchia Bond, MD as Attending Physician (Orthopedic Surgery) Clent Jacks, MD (Ophthalmology) Rolm Bookbinder, MD as Attending Physician (Dermatology) Irene Shipper, MD as Attending Physician (Gastroenterology)  Patient Instructions  Get appt back with dr Cruzita Lederer  About the   Osteoporosis   ? Best options  for you .  High risk medications    Can  Contribute to falling and cognition problems  .consdier weaning to lower dose and use with caution Pay attention to sleep hygeien and  Anti anxiety  Maneurvers. No alcohol with medication  Stop the methocarbamol  And only use as needed. Tox screen today Will notify you  of labs when available.   LEt us know if you had PNEUMOVAX 23 at  Another site if not come for inj only    Health Maintenance, Female Adopting a healthy  lifestyle and getting preventive care can go a long way to promote health and wellness. Talk with your health care provider about what schedule of regular examinations is right for you. This is a good chance for you to check in with your provider about disease prevention and staying healthy. In between checkups, there are plenty of things you can do on your own. Experts have done a lot of research about which lifestyle changes and preventive measures are most likely to keep you healthy. Ask your health care provider for more information. WEIGHT AND DIET  Eat a healthy diet  Be sure to include plenty of vegetables, fruits, low-fat dairy products, and lean protein.  Do not eat a lot of foods high in solid fats, added sugars, or salt.  Get regular exercise. This is one of the most important things you can do for your health.  Most adults should exercise for at least 150 minutes each week. The exercise should increase your heart rate and make you sweat (moderate-intensity exercise).  Most adults should also do strengthening exercises at least twice a week. This is in addition to the moderate-intensity exercise.  Maintain a healthy weight  Body mass index (BMI) is a measurement that can be used to identify possible weight problems. It estimates body fat based on height and weight. Your health care provider can help determine your BMI and help you achieve or maintain a healthy weight.  For females 77 years of age and older:   A BMI below 18.5 is considered underweight.  A BMI of 18.5 to 24.9 is normal.  A BMI of 25 to 29.9 is considered overweight.  A BMI of 30 and above is considered obese.  Watch levels of cholesterol and blood lipids  You should start having your blood tested for lipids and cholesterol at 77 years of age, then have this test every 5 years.  You may need to have your cholesterol levels checked more often if:  Your lipid or cholesterol levels are high.  You are older  than 77 years of age.  You are at high risk for heart disease.  CANCER SCREENING   Lung Cancer  Lung cancer screening is recommended for adults 34-31 years old who are at high risk for lung cancer because of a history of smoking.  A yearly low-dose CT scan of the lungs is recommended for people who:  Currently smoke.  Have quit within the past 15 years.  Have at least a 30-pack-year history of smoking. A pack year is smoking an average of one pack of cigarettes a day for 1 year.  Yearly screening should continue until it has been 15 years since you quit.  Yearly screening should stop if you develop a health problem that would prevent you from having lung cancer treatment.  Breast Cancer  Practice breast self-awareness. This means understanding how your breasts normally appear and feel.  It also means doing regular breast self-exams. Let your health care provider know about any changes, no matter how small.  If you are in your 20s or 30s, you should have a clinical breast exam (CBE) by a health care provider every 1-3 years as part of a regular health exam.  If you are 37 or older, have a CBE every year. Also consider having a breast X-ray (mammogram) every year.  If you have a family history of breast cancer, talk to your health care provider about genetic screening.  If you are at high risk for breast  cancer, talk to your health care provider about having an MRI and a mammogram every year.  Breast cancer gene (BRCA) assessment is recommended for women who have family members with BRCA-related cancers. BRCA-related cancers include:  Breast.  Ovarian.  Tubal.  Peritoneal cancers.  Results of the assessment will determine the need for genetic counseling and BRCA1 and BRCA2 testing. Cervical Cancer Your health care provider may recommend that you be screened regularly for cancer of the pelvic organs (ovaries, uterus, and vagina). This screening involves a pelvic  examination, including checking for microscopic changes to the surface of your cervix (Pap test). You may be encouraged to have this screening done every 3 years, beginning at age 57.  For women ages 51-65, health care providers may recommend pelvic exams and Pap testing every 3 years, or they may recommend the Pap and pelvic exam, combined with testing for human papilloma virus (HPV), every 5 years. Some types of HPV increase your risk of cervical cancer. Testing for HPV may also be done on women of any age with unclear Pap test results.  Other health care providers may not recommend any screening for nonpregnant women who are considered low risk for pelvic cancer and who do not have symptoms. Ask your health care provider if a screening pelvic exam is right for you.  If you have had past treatment for cervical cancer or a condition that could lead to cancer, you need Pap tests and screening for cancer for at least 20 years after your treatment. If Pap tests have been discontinued, your risk factors (such as having a new sexual partner) need to be reassessed to determine if screening should resume. Some women have medical problems that increase the chance of getting cervical cancer. In these cases, your health care provider may recommend more frequent screening and Pap tests. Colorectal Cancer  This type of cancer can be detected and often prevented.  Routine colorectal cancer screening usually begins at 77 years of age and continues through 77 years of age.  Your health care provider may recommend screening at an earlier age if you have risk factors for colon cancer.  Your health care provider may also recommend using home test kits to check for hidden blood in the stool.  A small camera at the end of a tube can be used to examine your colon directly (sigmoidoscopy or colonoscopy). This is done to check for the earliest forms of colorectal cancer.  Routine screening usually begins at age  31.  Direct examination of the colon should be repeated every 5-10 years through 76 years of age. However, you may need to be screened more often if early forms of precancerous polyps or small growths are found. Skin Cancer  Check your skin from head to toe regularly.  Tell your health care provider about any new moles or changes in moles, especially if there is a change in a mole's shape or color.  Also tell your health care provider if you have a mole that is larger than the size of a pencil eraser.  Always use sunscreen. Apply sunscreen liberally and repeatedly throughout the day.  Protect yourself by wearing long sleeves, pants, a wide-brimmed hat, and sunglasses whenever you are outside. HEART DISEASE, DIABETES, AND HIGH BLOOD PRESSURE   High blood pressure causes heart disease and increases the risk of stroke. High blood pressure is more likely to develop in:  People who have blood pressure in the high end of the normal range (130-139/85-89 mm  Hg).  People who are overweight or obese.  People who are African American.  If you are 79-80 years of age, have your blood pressure checked every 3-5 years. If you are 5 years of age or older, have your blood pressure checked every year. You should have your blood pressure measured twice--once when you are at a hospital or clinic, and once when you are not at a hospital or clinic. Record the average of the two measurements. To check your blood pressure when you are not at a hospital or clinic, you can use:  An automated blood pressure machine at a pharmacy.  A home blood pressure monitor.  If you are between 16 years and 73 years old, ask your health care provider if you should take aspirin to prevent strokes.  Have regular diabetes screenings. This involves taking a blood sample to check your fasting blood sugar level.  If you are at a normal weight and have a low risk for diabetes, have this test once every three years after 77 years  of age.  If you are overweight and have a high risk for diabetes, consider being tested at a younger age or more often. PREVENTING INFECTION  Hepatitis B  If you have a higher risk for hepatitis B, you should be screened for this virus. You are considered at high risk for hepatitis B if:  You were born in a country where hepatitis B is common. Ask your health care provider which countries are considered high risk.  Your parents were born in a high-risk country, and you have not been immunized against hepatitis B (hepatitis B vaccine).  You have HIV or AIDS.  You use needles to inject street drugs.  You live with someone who has hepatitis B.  You have had sex with someone who has hepatitis B.  You get hemodialysis treatment.  You take certain medicines for conditions, including cancer, organ transplantation, and autoimmune conditions. Hepatitis C  Blood testing is recommended for:  Everyone born from [birth year] through 1967.  Anyone with known risk factors for hepatitis C. Sexually transmitted infections (STIs)  You should be screened for sexually transmitted infections (STIs) including gonorrhea and chlamydia if:  You are sexually active and are younger than 77 years of age.  You are older than 77 years of age and your health care provider tells you that you are at risk for this type of infection.  Your sexual activity has changed since you were last screened and you are at an increased risk for chlamydia or gonorrhea. Ask your health care provider if you are at risk.  If you do not have HIV, but are at risk, it may be recommended that you take a prescription medicine daily to prevent HIV infection. This is called pre-exposure prophylaxis (PrEP). You are considered at risk if:  You are sexually active and do not regularly use condoms or know the HIV status of your partner(s).  You take drugs by injection.  You are sexually active with a partner who has HIV. Talk with your  health care provider about whether you are at high risk of being infected with HIV. If you choose to begin PrEP, you should first be tested for HIV. You should then be tested every 3 months for as long as you are taking PrEP.  PREGNANCY   If you are premenopausal and you may become pregnant, ask your health care provider about preconception counseling.  If you may become pregnant, take 400 to 800  micrograms (mcg) of folic acid every day.  If you want to prevent pregnancy, talk to your health care provider about birth control (contraception). OSTEOPOROSIS AND MENOPAUSE   Osteoporosis is a disease in which the bones lose minerals and strength with aging. This can result in serious bone fractures. Your risk for osteoporosis can be identified using a bone density scan.  If you are 62 years of age or older, or if you are at risk for osteoporosis and fractures, ask your health care provider if you should be screened.  Ask your health care provider whether you should take a calcium or vitamin D supplement to lower your risk for osteoporosis.  Menopause may have certain physical symptoms and risks.  Hormone replacement therapy may reduce some of these symptoms and risks. Talk to your health care provider about whether hormone replacement therapy is right for you.  HOME CARE INSTRUCTIONS   Schedule regular health, dental, and eye exams.  Stay current with your immunizations.   Do not use any tobacco products including cigarettes, chewing tobacco, or electronic cigarettes.  If you are pregnant, do not drink alcohol.  If you are breastfeeding, limit how much and how often you drink alcohol.  Limit alcohol intake to no more than 1 drink per day for nonpregnant women. One drink equals 12 ounces of beer, 5 ounces of wine, or 1 ounces of hard liquor.  Do not use street drugs.  Do not share needles.  Ask your health care provider for help if you need support or information about quitting  drugs.  Tell your health care provider if you often feel depressed.  Tell your health care provider if you have ever been abused or do not feel safe at home.   This information is not intended to replace advice given to you by your health care provider. Make sure you discuss any questions you have with your health care provider.   Document Released: 09/08/2010 Document Revised: 03/16/2014 Document Reviewed: 01/25/2013 Elsevier Interactive Patient Education 2016 [place] Maintenance, Female Adopting a healthy lifestyle and getting preventive care can go a long way to promote health and wellness. Talk with your health care provider about what schedule of regular examinations is right for you. This is a good chance for you to check in with your provider about disease prevention and staying healthy. In between checkups, there are plenty of things you can do on your own. Experts have done a lot of research about which lifestyle changes and preventive measures are most likely to keep you healthy. Ask your health care provider for more information. WEIGHT AND DIET  Eat a healthy diet  Be sure to include plenty of vegetables, fruits, low-fat dairy products, and lean protein.  Do not eat a lot of foods high in solid fats, added sugars, or salt.  Get regular exercise. This is one of the most important things you can do for your health.  Most adults should exercise for at least 150 minutes each week. The exercise should increase your heart rate and make you sweat (moderate-intensity exercise).  Most adults should also do strengthening exercises at least twice a week. This is in addition to the moderate-intensity exercise.  Maintain a healthy weight  Body mass index (BMI) is a measurement that can be used to identify possible weight problems. It estimates body fat based on height and weight. Your health care provider can help determine your BMI  and help you achieve  or maintain a healthy weight.  For females 57 years of age and older:   A BMI below 18.5 is considered underweight.  A BMI of 18.5 to 24.9 is normal.  A BMI of 25 to 29.9 is considered overweight.  A BMI of 30 and above is considered obese.  Watch levels of cholesterol and blood lipids  You should start having your blood tested for lipids and cholesterol at 77 years of age, then have this test every 5 years.  You may need to have your cholesterol levels checked more often if:  Your lipid or cholesterol levels are high.  You are older than 77 years of age.  You are at high risk for heart disease.  CANCER SCREENING   Lung Cancer  Lung cancer screening is recommended for adults 5-58 years old who are at high risk for lung cancer because of a history of smoking.  A yearly low-dose CT scan of the lungs is recommended for people who:  Currently smoke.  Have quit within the past 15 years.  Have at least a 30-pack-year history of smoking. A pack year is smoking an average of one pack of cigarettes a day for 1 year.  Yearly screening should continue until it has been 15 years since you quit.  Yearly screening should stop if you develop a health problem that would prevent you from having lung cancer treatment.  Breast Cancer  Practice breast self-awareness. This means understanding how your breasts normally appear and feel.  It also means doing regular breast self-exams. Let your health care provider know about any changes, no matter how small.  If you are in your 20s or 30s, you should have a clinical breast exam (CBE) by a health care provider every 1-3 years as part of a regular health exam.  If you are 52 or older, have a CBE every year. Also consider having a breast X-ray (mammogram) every year.  If you have a family history of breast cancer, talk to your health care provider about genetic screening.  If you are at high risk for breast cancer, talk to your health  care provider about having an MRI and a mammogram every year.  Breast cancer gene (BRCA) assessment is recommended for women who have family members with BRCA-related cancers. BRCA-related cancers include:  Breast.  Ovarian.  Tubal.  Peritoneal cancers.  Results of the assessment will determine the need for genetic counseling and BRCA1 and BRCA2 testing. Cervical Cancer Your health care provider may recommend that you be screened regularly for cancer of the pelvic organs (ovaries, uterus, and vagina). This screening involves a pelvic examination, including checking for microscopic changes to the surface of your cervix (Pap test). You may be encouraged to have this screening done every 3 years, beginning at age 73.  For women ages 43-65, health care providers may recommend pelvic exams and Pap testing every 3 years, or they may recommend the Pap and pelvic exam, combined with testing for human papilloma virus (HPV), every 5 years. Some types of HPV increase your risk of cervical cancer. Testing for HPV may also be done on women of any age with unclear Pap test results.  Other health care providers may not recommend any screening for nonpregnant women who are considered low risk for pelvic cancer and who do not have symptoms. Ask your health care provider if a screening pelvic exam is right for you.  If you have had past treatment for cervical  cancer or a condition that could lead to cancer, you need Pap tests and screening for cancer for at least 20 years after your treatment. If Pap tests have been discontinued, your risk factors (such as having a new sexual partner) need to be reassessed to determine if screening should resume. Some women have medical problems that increase the chance of getting cervical cancer. In these cases, your health care provider may recommend more frequent screening and Pap tests. Colorectal Cancer  This type of cancer can be detected and often prevented.  Routine  colorectal cancer screening usually begins at 77 years of age and continues through 77 years of age.  Your health care provider may recommend screening at an earlier age if you have risk factors for colon cancer.  Your health care provider may also recommend using home test kits to check for hidden blood in the stool.  A small camera at the end of a tube can be used to examine your colon directly (sigmoidoscopy or colonoscopy). This is done to check for the earliest forms of colorectal cancer.  Routine screening usually begins at age 76.  Direct examination of the colon should be repeated every 5-10 years through 77 years of age. However, you may need to be screened more often if early forms of precancerous polyps or small growths are found. Skin Cancer  Check your skin from head to toe regularly.  Tell your health care provider about any new moles or changes in moles, especially if there is a change in a mole's shape or color.  Also tell your health care provider if you have a mole that is larger than the size of a pencil eraser.  Always use sunscreen. Apply sunscreen liberally and repeatedly throughout the day.  Protect yourself by wearing long sleeves, pants, a wide-brimmed hat, and sunglasses whenever you are outside. HEART DISEASE, DIABETES, AND HIGH BLOOD PRESSURE   High blood pressure causes heart disease and increases the risk of stroke. High blood pressure is more likely to develop in:  People who have blood pressure in the high end of the normal range (130-139/85-89 mm Hg).  People who are overweight or obese.  People who are African American.  If you are 17-23 years of age, have your blood pressure checked every 3-5 years. If you are 24 years of age or older, have your blood pressure checked every year. You should have your blood pressure measured twice--once when you are at a hospital or clinic, and once when you are not at a hospital or clinic. Record the average of the  two measurements. To check your blood pressure when you are not at a hospital or clinic, you can use:  An automated blood pressure machine at a pharmacy.  A home blood pressure monitor.  If you are between 12 years and 56 years old, ask your health care provider if you should take aspirin to prevent strokes.  Have regular diabetes screenings. This involves taking a blood sample to check your fasting blood sugar level.  If you are at a normal weight and have a low risk for diabetes, have this test once every three years after 77 years of age.  If you are overweight and have a high risk for diabetes, consider being tested at a younger age or more often. PREVENTING INFECTION  Hepatitis B  If you have a higher risk for hepatitis B, you should be screened for this virus. You are considered at high risk for hepatitis B if:  You were born in a country where hepatitis B is common. Ask your health care provider which countries are considered high risk.  Your parents were born in a high-risk country, and you have not been immunized against hepatitis B (hepatitis B vaccine).  You have HIV or AIDS.  You use needles to inject street drugs.  You live with someone who has hepatitis B.  You have had sex with someone who has hepatitis B.  You get hemodialysis treatment.  You take certain medicines for conditions, including cancer, organ transplantation, and autoimmune conditions. Hepatitis C  Blood testing is recommended for:  Everyone born from [birth year] through 1967.  Anyone with known risk factors for hepatitis C. Sexually transmitted infections (STIs)  You should be screened for sexually transmitted infections (STIs) including gonorrhea and chlamydia if:  You are sexually active and are younger than 77 years of age.  You are older than 77 years of age and your health care provider tells you that you are at risk for this type of infection.  Your sexual activity has changed since you were  last screened and you are at an increased risk for chlamydia or gonorrhea. Ask your health care provider if you are at risk.  If you do not have HIV, but are at risk, it may be recommended that you take a prescription medicine daily to prevent HIV infection. This is called pre-exposure prophylaxis (PrEP). You are considered at risk if:  You are sexually active and do not regularly use condoms or know the HIV status of your partner(s).  You take drugs by injection.  You are sexually active with a partner who has HIV. Talk with your health care provider about whether you are at high risk of being infected with HIV. If you choose to begin PrEP, you should first be tested for HIV. You should then be tested every 3 months for as long as you are taking PrEP.  PREGNANCY   If you are premenopausal and you may become pregnant, ask your health care provider about preconception counseling.  If you may become pregnant, take 400 to 800 micrograms (mcg) of folic acid every day.  If you want to prevent pregnancy, talk to your health care provider about birth control (contraception). OSTEOPOROSIS AND MENOPAUSE   Osteoporosis is a disease in which the bones lose minerals and strength with aging. This can result in serious bone fractures. Your risk for osteoporosis can be identified using a bone density scan.  If you are 78 years of age or older, or if you are at risk for osteoporosis and fractures, ask your health care provider if you should be screened.  Ask your health care provider whether you should take a calcium or vitamin D supplement to lower your risk for osteoporosis.  Menopause may have certain physical symptoms and risks.  Hormone replacement therapy may reduce some of these symptoms and risks. Talk to your health care provider about whether hormone replacement therapy is right for you.  HOME CARE INSTRUCTIONS   Schedule regular health, dental, and eye exams.  Stay current with your  immunizations.   Do not use any tobacco products including cigarettes, chewing tobacco, or electronic cigarettes.  If you are pregnant, do not drink alcohol.  If you are breastfeeding, limit how much and how often you drink alcohol.  Limit alcohol intake to no more than 1 drink per day for nonpregnant women. One drink equals 12 ounces of beer, 5 ounces of wine, or 1 ounces  of hard liquor.  Do not use street drugs.  Do not share needles.  Ask your health care provider for help if you need support or information about quitting drugs.  Tell your health care provider if you often feel depressed.  Tell your health care provider if you have ever been abused or do not feel safe at home.   This information is not intended to replace advice given to you by your health care provider. Make sure you discuss any questions you have with your health care provider.   Document Released: 09/08/2010 Document Revised: 03/16/2014 Document Reviewed: 01/25/2013 Elsevier Interactive Patient Education 2016 [place] K. Panosh M.D.

## 2015-05-16 DIAGNOSIS — L814 Other melanin hyperpigmentation: Secondary | ICD-10-CM | POA: Diagnosis not present

## 2015-05-16 DIAGNOSIS — D1801 Hemangioma of skin and subcutaneous tissue: Secondary | ICD-10-CM | POA: Diagnosis not present

## 2015-05-16 DIAGNOSIS — L853 Xerosis cutis: Secondary | ICD-10-CM | POA: Diagnosis not present

## 2015-05-16 DIAGNOSIS — L570 Actinic keratosis: Secondary | ICD-10-CM | POA: Diagnosis not present

## 2015-05-16 DIAGNOSIS — Z85828 Personal history of other malignant neoplasm of skin: Secondary | ICD-10-CM | POA: Diagnosis not present

## 2015-05-16 DIAGNOSIS — D225 Melanocytic nevi of trunk: Secondary | ICD-10-CM | POA: Diagnosis not present

## 2015-05-16 DIAGNOSIS — L821 Other seborrheic keratosis: Secondary | ICD-10-CM | POA: Diagnosis not present

## 2015-05-16 DIAGNOSIS — D692 Other nonthrombocytopenic purpura: Secondary | ICD-10-CM | POA: Diagnosis not present

## 2015-05-24 ENCOUNTER — Encounter: Payer: Self-pay | Admitting: Internal Medicine

## 2015-06-18 DIAGNOSIS — Z961 Presence of intraocular lens: Secondary | ICD-10-CM | POA: Diagnosis not present

## 2015-06-18 DIAGNOSIS — H43813 Vitreous degeneration, bilateral: Secondary | ICD-10-CM | POA: Diagnosis not present

## 2015-06-18 DIAGNOSIS — H04123 Dry eye syndrome of bilateral lacrimal glands: Secondary | ICD-10-CM | POA: Diagnosis not present

## 2015-06-18 DIAGNOSIS — H5000 Unspecified esotropia: Secondary | ICD-10-CM | POA: Diagnosis not present

## 2015-06-20 ENCOUNTER — Other Ambulatory Visit: Payer: Self-pay | Admitting: Internal Medicine

## 2015-06-20 NOTE — Telephone Encounter (Signed)
Filled 09/2014.  Request is too early

## 2015-06-25 ENCOUNTER — Telehealth: Payer: Self-pay | Admitting: Internal Medicine

## 2015-06-25 NOTE — Telephone Encounter (Signed)
Duplicate request.  Message has been sent to [facility].

## 2015-06-25 NOTE — Telephone Encounter (Signed)
Pt need refill on Rx clonazepam (klonepin) 0.5 mg.  Pharm:  [facility].

## 2015-06-25 NOTE — Telephone Encounter (Signed)
Faxed to the pharmacy.  Receipt of confirmation received.

## 2015-06-25 NOTE — Telephone Encounter (Signed)
Ok to refill  X 1 

## 2015-07-16 ENCOUNTER — Other Ambulatory Visit: Payer: Self-pay | Admitting: Internal Medicine

## 2015-07-17 NOTE — Telephone Encounter (Signed)
Sent to the pharmacy by e-scribe. 

## 2015-07-22 ENCOUNTER — Ambulatory Visit (INDEPENDENT_AMBULATORY_CARE_PROVIDER_SITE_OTHER): Payer: Medicare Other | Admitting: Internal Medicine

## 2015-07-22 ENCOUNTER — Encounter: Payer: Self-pay | Admitting: Internal Medicine

## 2015-07-22 VITALS — BP 110/60 | HR 82 | Temp 98.0°F | Ht 63.5 in | Wt 118.0 lb

## 2015-07-22 DIAGNOSIS — M810 Age-related osteoporosis without current pathological fracture: Secondary | ICD-10-CM

## 2015-07-22 LAB — VITAMIN D 25 HYDROXY (VIT D DEFICIENCY, FRACTURES): VITD: 59.78 ng/mL (ref 30.00–100.00)

## 2015-07-22 NOTE — Progress Notes (Signed)
Patient ID: Jacqueline Burgess, female   DOB: 13-Nov-1940, 75 y.o.   MRN: [id number]   HPI  Jacqueline Burgess is a 75 y.o.-year-old female, returning for f/u for osteoporosis, dx 2000. Last visit 1 year ago.  Since last visit, she had 3 Prolia injections, which she tolerated well. However, she has recurrent chalazion >> we decided to switch to Reclast, however she tells me that she opted not to have Reclast for fear of more skin infections.   No fx's in the interim. No pain in legs or jaw.   Reviewed hx: DEXA scans:  Date L1-L2 T score Hip - L&R T score 33% distal Radius T score UD radius  02/09/2013 - [facility] N/a LFN: - 1.6 RFN: -1.8 -4.2 (-11.7%*) -5.6  02/05/2011 - [facility] Deg. changes, severe scoliosis: -3.1 LFN: - 1.2 RFN: -1.7 -3.5  -4.6   For the 2010 DEXA,  I only have the reading, not the actual images + report: Date L1-L2 T score Total R hip % change 33% distal Radius T score  01/25/2009 n/a -2.4% (sig)  ! Not T score -3.2   I reviewed patient's previous imaging results: In 02/06/2000: Xray: R great toe comminuted non-displaced fx - prox. [facility] - desk fell on toe In 02/11/2003: bone scan: probable L T8 rib fx  Pt had a closed L pelvic (sup and inf pubic rami) fx and a shoulder fx in 2012 - seen on Xray from 11/13/2010, after a fall from level ground (orthostasis).   She denies any recent falls. No dizziness/vertigo/orthostasis.  She continues to walk daily.  No h/o hyper/hypocalcemia. No h/o hyperparathyroidism. Has a remote h/o kidney stones in 1964 (pregnancy related).  Lab Results  Component Value Date   PTH 28.7 02/26/2012   CALCIUM 9.8 05/08/2015   CALCIUM 9.5 09/13/2014   CALCIUM 9.4 07/20/2014   CALCIUM 9.0 12/13/2013   CALCIUM 9.1 03/14/2013   CALCIUM 9.1 02/26/2012   CALCIUM 9.6 02/26/2012   CALCIUM 8.2* 11/13/2010   CALCIUM 7.8* 11/12/2010   CALCIUM 9.5 11/11/2010   No h/o thyrotoxicosis. Reviewed TSH recent levels:  Lab Results  Component Value Date    TSH 0.43 03/14/2013   TSH 0.89 02/26/2012   No vitamin D deficiency. Last vit D level was:  Component     Latest Ref Rng 02/26/2012 12/13/2013  Vit D, 25-Hydroxy     30 - 89 ng/mL 77   VITD     30.00 - 100.00 ng/mL  76.77   No h/o CKD. Last BUN/Cr: Lab Results  Component Value Date   BUN 17 05/08/2015   CREATININE 0.77 05/08/2015   Pt is on: - 1 calcium citrate - D once a day at night  - 1 MVI.  - stopped 1 vitamin D 2000 IU in am  ROS: Constitutional: no weight gain/loss, no fatigue, no subjective hyperthermia/hypothermia Eyes: no blurry vision, no xerophthalmia ENT: no sore throat, no nodules palpated in throat, no dysphagia/odynophagia, no hoarseness Cardiovascular: no CP/SOB/+ palpitations/no leg swelling Respiratory: no cough/SOB Gastrointestinal: no N/V/D/C Musculoskeletal: no muscle/joint aches Skin: + rash  - leg; + hair loss Neurological: no tremors/numbness/tingling/dizziness  PE: BP 110/60 mmHg  Pulse 82  Temp(Src) 98 F (36.7 C) (Oral)  Ht 5' 3.5" (1.613 m)  Wt 118 lb (53.524 kg)  BMI 20.57 kg/m2 Wt Readings from Last 3 Encounters:  07/22/15 118 lb (53.524 kg)  05/08/15 119 lb 4.8 oz (54.114 kg)  01/08/15 117 lb (53.071 kg)   Constitutional: very thin, in  NAD. No kyphosis. Eyes: PERRLA, EOMI, no exophthalmos ENT: moist mucous membranes, no thyromegaly, no cervical lymphadenopathy Cardiovascular: RRR, No MRG Respiratory: CTA B Gastrointestinal: abdomen soft, NT, ND, BS+ Musculoskeletal: no deformities, strength intact in all 4 Skin: moist, warm, no rashes Neurological: no tremor with outstretched hands, DTR normal in all 4  Assessment: 1. Osteoporosis - h/o 3-4 years of po Bisph 10 years ago - on Prolia x 3 - h/o fxs  Plan: 1. Osteoporosis - reviewed previous labs and DEXA scans. Her last DEXA scan from 2014 shows an imbalance between the BMD at the radius and hip, consistent with more cortical resorption, which can appear in  hyperparathyroidism. We reviewed together her previous calcium and parathyroid hormone levels and these were normal. I will recheck them today, since the last PTH was from 2013. - pt had 3 doses of Prolia >> did not have a fracture after starting Prolia. She, however, has recurrent chalazion >> very bothersome >> I advised her that I cannot rule out an assoc. with Prolia and suggested to switch to Reclast >> she did not switch that she was afraid that she may continue to get skin infections, therefore, Prolia was stopped without following up with an anti-resorptive medicine. In this case, unfortunately, the bone density gain is rapidly lost... - We decided to still get another DEXA scan now - I still suggested to start Reclast, but she is very reticent due to her previous skin problems. I advised her that skin infections are a side effect recognized only for Prolia not for Reclast. She would like to think about it. I gave her more information about Reclast to read at home. - continue current vitamin D doses - will check a vit D level  - I will see her back in 1 year   Office Visit on 07/22/2015  Component Date Value Ref Range Status  . VITD 07/22/2015 59.78  30.00 - 100.00 ng/mL Final  . Calcium 07/22/2015 9.2  8.7 - 10.3 mg/dL Final  . PTH 07/22/2015 28  15 - 65 pg/mL Final  . PTH 07/22/2015 Comment   Final   Comment: Interpretation                 Intact PTH    Calcium                                 (pg/mL)      (mg/dL) Normal                          15 - 65     8.6 - 10.2 Primary Hyperparathyroidism         >65          >10.2 Secondary Hyperparathyroidism       >65          <10.2 Non-Parathyroid Hypercalcemia       <65          >10.2 Hypoparathyroidism                  <15          < 8.6 Non-Parathyroid Hypocalcemia    15 - 65          < 8.6    Excellent labs: Calcium, parathyroid hormone, and vitamin D are all excellent. Please continue her current dose of vitamin D supplement.

## 2015-07-22 NOTE — Patient Instructions (Signed)
Please stop at the lab.  Think about Reclast.  Zoledronic acid: Patient drug information (Up-to-Date) Copyright [phone] [employer] rights reserved.  Brand Names: [place]  Reclast;  Zometa What is this drug used for?  .It is used to treat high calcium levels.  .It is used when treating some cancers.  .It is used to treat Paget's disease.  .It is used to put off or treat soft, brittle bones (osteoporosis).  .It may be given to you for other reasons. Talk with the doctor. What do I need to tell my doctor BEFORE I take this drug?  All products:  .If you have an allergy to zoledronic acid or any other part of this drug.  .If you are allergic to any drugs like this one, any other drugs, foods, or other substances. Tell your doctor about the allergy and what signs you had, like rash; hives; itching; shortness of breath; wheezing; cough; swelling of face, lips, tongue, or throat; or any other signs.  Reclast:  .If you have low calcium levels.  .If you have very bad kidney disease.  This is not a list of all drugs or health problems that interact with this drug.  Tell your doctor and pharmacist about all of your drugs (prescription or OTC, natural products, vitamins) and health problems. You must check to make sure that it is safe for you to take this drug with all of your drugs and health problems. Do not start, stop, or change the dose of any drug without checking with your doctor. What are some things I need to know or do while I take this drug?  All products:  .Tell dentists, surgeons, and other doctors that you use this drug.  .Worsening of asthma has happened in people taking drugs like this one. Talk with your doctor.  .This drug may raise the chance of a broken leg. Talk with your doctor.  .Have your blood work checked often. Talk with your doctor.  .Have a bone density test. Talk with your doctor.  .Have a dental exam before starting this drug.  .Take good care of your teeth.  See a dentist often.  .Do not give to a child. Talk with your doctor.  .If you are 60 or older, use this drug with care. You could have more side effects.  .This drug may cause harm to the unborn baby if you take it while you are pregnant.  .Tell your doctor if you are pregnant or plan on getting pregnant. You will need to talk about the benefits and risks of using this drug while you are pregnant.  .Tell your doctor if you are breast-feeding. You will need to talk about any risks to your baby.  Zometa:  .Take calcium and vitamin D as you were told by your doctor.  Reclast:  .This drug works best when used with calcium/vitamin D and weight-bearing workouts like walking or PT (physical therapy).  .Follow the diet and workout plan that your doctor told you about.  .Use birth control that you can trust to prevent pregnancy while taking this drug. What are some side effects that I need to call my doctor about right away?  WARNING/CAUTION: Even though it may be rare, some people may have very bad and sometimes deadly side effects when taking a drug. Tell your doctor or get medical help right away if you have any of the following signs or symptoms that may be related to a very bad side effect:  .Signs of an  allergic reaction, like rash; hives; itching; red, swollen, blistered, or peeling skin with or without fever; wheezing; tightness in the chest or throat; trouble breathing or talking; unusual hoarseness; or swelling of the mouth, face, lips, tongue, or throat.  .Signs of low calcium levels like muscle cramps or spasms, numbness and tingling, or seizures.  .Signs of kidney problems like unable to pass urine, change in the amount of urine passed, blood in the urine, or a big weight gain.  .Very bad bone, joint, or muscle pain.  .Any new or strange groin, hip, or thigh pain.  .Chest pain.  .A heartbeat that does not feel normal.  .Slow heartbeat.  .Change in eyesight.  .Eye pain.  .Mouth sores.   .Trouble swallowing.  .Very bad pain when swallowing.  .Any bruising or bleeding.  .Pain where the shot was given.  .Redness or swelling where the shot is given.  .This drug may cause jawbone problems. The chance may be higher the longer you take this drug. The chance may be higher if you have cancer, dental problems, dentures that do not fit well, anemia, blood clotting problems, or an infection. The chance may also be higher if you are having dental work or if you are getting chemo, some steroid drugs, or radiation. Call your doctor right away if you have jaw swelling or pain. What are some other side effects of this drug?  All drugs may cause side effects. However, many people have no side effects or only have minor side effects. Call your doctor or get medical help if any of these side effects or any other side effects bother you or do not go away:  All products:  .Dizziness.  Marland KitchenUpset stomach or throwing up.  .Irritation where the shot is given.  .Feeling tired or weak.  .Belly pain.  Marland KitchenHeadache.  .Flu-like signs.  .Loose stools (diarrhea).  .Muscle or joint pain.  .Back pain.  Zometa:  .Not able to sleep.  .Not hungry.  .Hard stools (constipation).  .Weight loss.  .Cough.  These are not all of the side effects that may occur. If you have questions about side effects, call your doctor. Call your doctor for medical advice about side effects.  You may report side effects to your national health agency. How is this drug best taken?  Use this drug as ordered by your doctor. Read and follow the dosing on the label closely.  All products:  .It is given as a shot into a vein over a period of time.  .Drink lots of noncaffeine liquids unless told to drink less liquid by your doctor.  Reclast:  .Acetaminophen may be given to lower fever and chills.  .Drink at least 2 glasses of liquids a few hours before you get this drug. What do I do if I miss a dose?  .Call the doctor to find out  what to do. How do I store and/or throw out this drug?  Marland KitchenThis drug will be given to you in a hospital or doctor's office. You will not store it at home.  Marland KitchenKeep all drugs out of the reach of children and pets.  .Check with your pharmacist about how to throw out unused drugs.  General drug facts  .If your symptoms or health problems do not get better or if they become worse, call your doctor.  .Do not share your drugs with others and do not take anyone else's drugs.  Marland KitchenKeep a list of all your drugs (prescription, natural products, vitamins, OTC) with  you. Give this list to your doctor.  .Talk with the doctor before starting any new drug, including prescription or OTC, natural products, or vitamins.  .Some drugs may have another patient information leaflet. If you have any questions about this drug, please talk with your doctor, pharmacist, or other health care provider.  .If you think there has been an overdose, call your poison control center or get medical care right away. Be ready to tell or show what was taken, how much, and when it happened.  Please return in 1 year.

## 2015-07-23 LAB — PTH, INTACT AND CALCIUM
CALCIUM: 9.2 mg/dL (ref 8.7–10.3)
PTH: 28 pg/mL (ref 15–65)

## 2015-09-27 ENCOUNTER — Ambulatory Visit (INDEPENDENT_AMBULATORY_CARE_PROVIDER_SITE_OTHER): Payer: Medicare Other | Admitting: Podiatry

## 2015-09-27 DIAGNOSIS — B351 Tinea unguium: Secondary | ICD-10-CM

## 2015-09-27 DIAGNOSIS — L600 Ingrowing nail: Secondary | ICD-10-CM

## 2015-09-27 MED ORDER — CEPHALEXIN 500 MG PO CAPS: 500.0000 mg | ORAL_CAPSULE | Freq: Three times a day (TID) | ORAL | Status: DC

## 2015-09-27 NOTE — Progress Notes (Signed)
   Subjective:    Patient ID: Jacqueline Burgess, female    DOB: 1941/01/31, 75 y.o.   MRN: [id number]  HPI  75 year old female presents the office today for concerns of painful ingrown toenail right big toe which is been ongoing for about 1 week. Ongoing for several months and years and she gets recurrent infections. She currently denies any drainage or pus coming from the toenail. She were to have the corn of the nail removed.  Review of Systems  HENT: Positive for hearing loss.        Objective:   Physical Exam General: AAO x3, NAD  Dermatological: Incurvation on the medial aspect of the right hallux toenail tenderness palpation. There is localized edema and some faint erythema around the nail border without any ascending synovitis. There is no drainage or pus. No fluctuance or crepitus. No tenderness the lateral nail border of the other toenails. No open lesions or pre-ulcerative lesions.  Vascular: Dorsalis Pedis artery and Posterior Tibial artery pedal pulses are 2/4 bilateral with immedate capillary fill time.  There is no pain with calf compression, swelling, warmth, erythema.   Neruologic: Grossly intact via light touch bilateral. Vibratory intact via tuning fork bilateral. Protective threshold with Semmes Wienstein monofilament intact to all pedal sites bilateral.    Musculoskeletal: No gross boney pedal deformities bilateral. No pain, crepitus, or limitation noted with foot and ankle range of motion bilateral. Muscular strength 5/5 in all groups tested bilateral.  Gait: Unassisted, Nonantalgic.      Assessment & Plan:  75 year old female right medial hallux ingrown toenail, symptomatic -Treatment options discussed including all alternatives, risks, and complications -Etiology of symptoms were discussed -At this time, the patient is requesting partial nail removal with chemical matricectomy to the symptomatic portion of the nail. Risks and complications were discussed with the  patient for which they understand and  verbally consent to the procedure. Under sterile conditions a total of 3 mL of a mixture of 2% lidocaine plain and 0.5% Marcaine plain was infiltrated in a hallux block fashion. Once anesthetized, the skin was prepped in sterile fashion. A tourniquet was then applied. Next the medial  aspect of hallux nail border was then sharply excised making sure to remove the entire offending nail border. Once the nails were ensured to be removed area was debrided and the underlying skin was intact. There is no purulence identified in the procedure. Next phenol was then applied under standard conditions and copiously irrigated. Silvadene was applied. A dry sterile dressing was applied. After application of the dressing the tourniquet was removed and there is found to be an immediate capillary refill time to the digit. The patient tolerated the procedure well any complications. Post procedure instructions were discussed the patient for which he verbally understood. Follow-up in one week for nail check or sooner if any problems are to arise. Discussed signs/symptoms of infection and directed to call the office immediately should any occur or go directly to the emergency room. In the meantime, encouraged to call the office with any questions, concerns, changes symptoms.  Celesta Gentile, DPM

## 2015-09-27 NOTE — Patient Instructions (Signed)

## 2015-10-06 DIAGNOSIS — L600 Ingrowing nail: Secondary | ICD-10-CM | POA: Insufficient documentation

## 2015-10-07 ENCOUNTER — Ambulatory Visit (INDEPENDENT_AMBULATORY_CARE_PROVIDER_SITE_OTHER): Payer: Medicare Other | Admitting: Podiatry

## 2015-10-07 DIAGNOSIS — Z9889 Other specified postprocedural states: Secondary | ICD-10-CM

## 2015-10-07 NOTE — Patient Instructions (Signed)

## 2015-10-08 NOTE — Progress Notes (Signed)
Subjective: Jacqueline Burgess is a 75 y.o.  female returns to office today for follow up evaluation after having right medial Hallux partial nail avulsion performed. Patient has been soaking using epsom satls and applying topical antibiotic covered with bandaid daily. No pain, drainage, redness. Patient denies fevers, chills, nausea, vomiting. Denies any calf pain, chest pain, SOB.   Objective:  Vitals: Reviewed  General: Well developed, nourished, in no acute distress, alert and oriented x3   Dermatology: Skin is warm, dry and supple bilateral. Right hallux nail border appears to be clean, dry, with a small surrounding scab. There is no surrounding erythema, edema, drainage/purulence. The remaining nails appear unremarkable at this time. There are no other lesions or other signs of infection present.  Neurovascular status: Intact. No lower extremity swelling; No pain with calf compression bilateral.  Musculoskeletal: No tenderness to palpation of the medial  hallux nail fold. Muscular strength within normal limits bilateral.   Assesement and Plan: S/p partial nail avulsion, doing well.   -Continue soaking in epsom salts twice a day followed by antibiotic ointment and a band-aid. Can leave uncovered at night. Continue this until completely healed.  -If the area has not healed in 2 weeks, call the office for follow-up appointment, or sooner if any problems arise.  -Monitor for any signs/symptoms of infection. Call the office immediately if any occur or go directly to the emergency room. Call with any questions/concerns.  Celesta Gentile, DPM

## 2015-10-10 ENCOUNTER — Telehealth: Payer: Self-pay | Admitting: *Deleted

## 2015-10-10 NOTE — Telephone Encounter (Signed)
Dr. Jacqualyn Posey reviewed 09/27/2015 fungal culture results as +, pt needs an appt.  I informed pt and she states she will review her schedule an call again.

## 2015-10-15 ENCOUNTER — Encounter: Payer: Self-pay | Admitting: Podiatry

## 2015-10-16 DIAGNOSIS — L608 Other nail disorders: Secondary | ICD-10-CM | POA: Diagnosis not present

## 2015-10-16 DIAGNOSIS — Z85828 Personal history of other malignant neoplasm of skin: Secondary | ICD-10-CM | POA: Diagnosis not present

## 2015-10-16 DIAGNOSIS — B351 Tinea unguium: Secondary | ICD-10-CM | POA: Diagnosis not present

## 2015-10-17 DIAGNOSIS — B351 Tinea unguium: Secondary | ICD-10-CM | POA: Diagnosis not present

## 2015-10-17 DIAGNOSIS — L608 Other nail disorders: Secondary | ICD-10-CM | POA: Diagnosis not present

## 2015-11-20 ENCOUNTER — Encounter: Payer: Self-pay | Admitting: Internal Medicine

## 2015-11-20 DIAGNOSIS — L280 Lichen simplex chronicus: Secondary | ICD-10-CM | POA: Diagnosis not present

## 2015-11-20 DIAGNOSIS — Z85828 Personal history of other malignant neoplasm of skin: Secondary | ICD-10-CM | POA: Diagnosis not present

## 2015-11-20 DIAGNOSIS — L814 Other melanin hyperpigmentation: Secondary | ICD-10-CM | POA: Diagnosis not present

## 2015-11-20 DIAGNOSIS — D0472 Carcinoma in situ of skin of left lower limb, including hip: Secondary | ICD-10-CM | POA: Diagnosis not present

## 2015-11-20 DIAGNOSIS — L821 Other seborrheic keratosis: Secondary | ICD-10-CM | POA: Diagnosis not present

## 2015-11-20 DIAGNOSIS — D485 Neoplasm of uncertain behavior of skin: Secondary | ICD-10-CM | POA: Diagnosis not present

## 2015-11-20 DIAGNOSIS — L570 Actinic keratosis: Secondary | ICD-10-CM | POA: Diagnosis not present

## 2015-11-20 DIAGNOSIS — D0471 Carcinoma in situ of skin of right lower limb, including hip: Secondary | ICD-10-CM | POA: Diagnosis not present

## 2015-11-20 DIAGNOSIS — D1801 Hemangioma of skin and subcutaneous tissue: Secondary | ICD-10-CM | POA: Diagnosis not present

## 2015-11-20 DIAGNOSIS — B351 Tinea unguium: Secondary | ICD-10-CM | POA: Diagnosis not present

## 2015-11-27 DIAGNOSIS — Z23 Encounter for immunization: Secondary | ICD-10-CM | POA: Diagnosis not present

## 2015-12-11 DIAGNOSIS — Z85828 Personal history of other malignant neoplasm of skin: Secondary | ICD-10-CM | POA: Diagnosis not present

## 2015-12-11 DIAGNOSIS — D0471 Carcinoma in situ of skin of right lower limb, including hip: Secondary | ICD-10-CM | POA: Diagnosis not present

## 2016-01-01 ENCOUNTER — Other Ambulatory Visit: Payer: Self-pay | Admitting: Internal Medicine

## 2016-01-03 DIAGNOSIS — M545 Low back pain: Secondary | ICD-10-CM | POA: Diagnosis not present

## 2016-01-03 NOTE — Telephone Encounter (Signed)
Ok to refill x 1  

## 2016-01-06 NOTE — Telephone Encounter (Signed)
Sent to the pharmacy by fax.  Received confirmation that the fax was successful. 

## 2016-02-07 ENCOUNTER — Telehealth: Payer: Self-pay

## 2016-02-07 NOTE — Telephone Encounter (Signed)
I contacted the patient regarding her prolia injection. Pateint stated to me she is not taking the prolia or reclast infusions because of bad reactions. Patient has her yearly follow up on 07/24/2016 and would like to wait until that appointment to discuss further options.

## 2016-04-13 ENCOUNTER — Other Ambulatory Visit: Payer: Self-pay | Admitting: Internal Medicine

## 2016-04-14 NOTE — Telephone Encounter (Signed)
Ok to refill x 1  

## 2016-04-15 NOTE — Telephone Encounter (Signed)
Faxed to the pharmacy.  Received confirmation that the fax was successful.

## 2016-04-16 DIAGNOSIS — L853 Xerosis cutis: Secondary | ICD-10-CM | POA: Diagnosis not present

## 2016-04-16 DIAGNOSIS — D1801 Hemangioma of skin and subcutaneous tissue: Secondary | ICD-10-CM | POA: Diagnosis not present

## 2016-04-16 DIAGNOSIS — Z85828 Personal history of other malignant neoplasm of skin: Secondary | ICD-10-CM | POA: Diagnosis not present

## 2016-04-24 ENCOUNTER — Encounter: Payer: Self-pay | Admitting: Internal Medicine

## 2016-04-24 ENCOUNTER — Ambulatory Visit (INDEPENDENT_AMBULATORY_CARE_PROVIDER_SITE_OTHER): Payer: Medicare Other | Admitting: Internal Medicine

## 2016-04-24 VITALS — BP 150/82 | Temp 98.2°F | Ht 63.5 in | Wt 119.0 lb

## 2016-04-24 DIAGNOSIS — Z974 Presence of external hearing-aid: Secondary | ICD-10-CM

## 2016-04-24 DIAGNOSIS — R413 Other amnesia: Secondary | ICD-10-CM | POA: Diagnosis not present

## 2016-04-24 DIAGNOSIS — Z79899 Other long term (current) drug therapy: Secondary | ICD-10-CM

## 2016-04-24 DIAGNOSIS — H539 Unspecified visual disturbance: Secondary | ICD-10-CM | POA: Diagnosis not present

## 2016-04-24 DIAGNOSIS — R030 Elevated blood-pressure reading, without diagnosis of hypertension: Secondary | ICD-10-CM

## 2016-04-24 NOTE — Progress Notes (Signed)
Chief Complaint  Patient presents with  . Diplopia    Has seen eye doctor several times and tests come back normal.  . Spots and/or Floaters  . Short Term Memory Loss    HPI:  Jacqueline Burgess 76 y.o.   See above    Last visit with me  A year ago Comes in with son Jacqueline Burgess who is now living in the area for the last year. Jacqueline Burgess is been having problem with eye symptoms for about a year. She describes dark spots that she calls   Gnats   were obvious at night and with    Fluorescent lights    some better with glasses.   Lateral   Diplopia  Every day at night  Up and down .  Last temporarily usually related to left eye. Cannot delineate how often she has it but it is daily. Usually not in central vision.  Denies any specific headache falling other neurologic symptoms. However  Son is concerned about her memory. She does have anxiety as her husband has been in the hospital but he is noted thinks that maybe a little more than usual such as not remembering a phone call when she called someone.  She continues to do heart healthy things no falling but is taking low-dose amitriptyline Clonopin and a quarter of a Robaxin at night.  Denies any unsteadiness  Falling    Jacqueline Burgess .  Senior your head done I evaluation perhaps a year or so ago for these symptoms with no diagnosis she reports fairly extensive testing including blood work. I don't have those records today.     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  ROS: See pertinent positives and negatives per HPI. No current chest pain shortness of breath strokelike symptoms.  Past Medical History:  Diagnosis Date  . Arthritis   . Cataract    lens implant  . Closed pelvic fracture ([place]) 2012   pelvis and shoulder   . Colon polyps   . Hx of fall    fracture pelvis and left shoulder humerus  . Hx of nonmelanoma skin cancer    ssca  . Hx of syncope    had neg event monitor per Jacqueline Burgess in 2010  . Hypercalciuria    pu on hctz per record    . Kidney stones 1967  . MVP (mitral valve prolapse)    in records  no echo eval in records  . Osteoporosis    eval Jacqueline Jacqueline Burgess in past dexa 11 12     Family History  Problem Relation Age of Onset  . CVA Mother     Died in her [id number] complications rheumatic  fever afterwards when she got a stroke  . Lung cancer Sister     Died in 31s was a smoker  . Rheumatic fever Mother   . Thyroid cancer Daughter     Papillary    Social History   Social History  . Marital status: Married    Spouse name: N/A  . Number of children: N/A  . Years of education: N/A   Social History Main Topics  . Smoking status: Never Smoker  . Smokeless tobacco: Never Used  . Alcohol use 1.8 oz/week    3 Standard drinks or equivalent per week  . Drug use: No  . Sexual activity: Yes    Partners: Female   Other Topics Concern  . 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   g4 P4   Regular exercise: walks daily   Caffeine use: 2 cups of coffee daily          Outpatient Medications Prior to Visit  Medication Sig Dispense Refill  . amitriptyline (ELAVIL) 10 MG tablet TAKE 1 TABLET EVERY NIGHT AT BEDTIME 90 tablet 1  . betamethasone dipropionate (DIPROLENE) 0.05 % cream Apply 1 application topically 2 (two) times daily. Reported on 09/27/2015    . Calcium Carbonate-Vitamin D (CALCIUM-VITAMIN D) 500-200 MG-UNIT per tablet Take 1 tablet by mouth 2 (two) times daily with a meal. Reported on 09/27/2015    . clonazePAM (KLONOPIN) 0.5 MG tablet TAKE 1 TABLET AT BEDTIME AS NEEDED 90 tablet 0  . glucosamine-chondroitin 500-400 MG tablet Take 1 tablet by mouth daily. Reported on 09/27/2015    . IBUPROFEN PO Take by mouth. 1 OR 2 DAILY PRN    . methocarbamol (ROBAXIN) 500 MG tablet Take 500 mg by mouth. Reported on 09/27/2015     . Multiple Vitamins-Minerals (MULTIVITAL) tablet Take 1 tablet by mouth daily. Reported on 09/27/2015    . tretinoin (RETIN-A) 0.025 % cream Reported on 09/27/2015    . cephALEXin (KEFLEX) 500 MG capsule Take 1 capsule (500 mg total) by mouth 3 (three) times daily. 30 capsule 0   No facility-administered medications prior to visit.      EXAM:  BP (!) 150/82 (BP Location: Right Arm, Patient Position: Sitting, Cuff Size: Normal)   Temp 98.2 F (36.8 C) (Oral)   Ht 5' 3.5" (1.613 m)   Wt 119 lb (54 kg)   BMI 20.75 kg/m   Body mass index is 20.75 kg/m.  GENERAL: vitals reviewed and listed above, alert, oriented, appears well hydrated and in no acute distress HEENT: atraumatic, conjunctiva  clear, no obvious abnormalities on inspection of external nose and earsEOMs seem normal grossly. OP : no lesion edema or exudate tongue is midline. NECK: no obvious masses on inspection palpation  LUNGS: clear to auscultation bilaterally, no wheezes, rales or rhonchi, good air movement CV: HRRR, no clubbing cyanosis or  peripheral edema nl cap refill  MS: moves all extremities without noticeable focal  Abnormality Exam grossly nonfocal negative Romberg good heel-to-toe forward and backwards excellent balance. PSYCH: pleasant and cooperative,  Mild anxiety    ASSESSMENT AND PLAN:  Discussed the following assessment and plan:  Vision disturbance - Plan: Ambulatory referral to Neurology  Memory difficulties - Plan: Ambulatory referral to Neurology  Wears hearing aid  High risk medication use  Elevated blood pressure reading Vision concerns  Apparently ongoing off and on for a year she has no central diplopia sounds like related to lateral eye movement or adjustment will need ophthalmology records and discussed referring to neurology for further evaluation. In regard to the memory son brings up some concerns although she thinks it's just being anxious.  Also refer for evaluation  If  Would  benefit from neurocognitive testing or not  Blood pressure is slightly up today and it has been normal she can check his readings at home. Discussed the medication she is taking at night although low-dose can all relate to memory interference. Try decreasing the amount she is taking. Or get off one of the meds. -Patient advised to return or notify health care team  if  symptoms worsen ,persist or new concerns arise.  Patient Instructions   Get [place] a copy of  Jacqueline Patrici Ranks evaluation.   Will  Be contacted about  Neurology referral as we discussed .  Your exam is reassuring today .   Uncertain if BP is elevaetd  Make sure  At goal  At home  consdier med if elevated    Below 140/90   Or lower    BP Readings from Last 3 Encounters:  04/24/16 (!) 150/82  07/22/15 110/60  05/08/15 114/76       Fu if    Worse in interim .      Standley Brooking. Chelsa Stout M.D.

## 2016-04-24 NOTE — Progress Notes (Signed)
Pre visit review using our clinic review tool, if applicable. No additional management support is needed unless otherwise documented below in the visit note. 

## 2016-04-24 NOTE — Patient Instructions (Addendum)
Get [place] a copy of  Dr Patrici Ranks evaluation.   Will  Be contacted about  Neurology referral as we discussed .  Your exam is reassuring today .   Uncertain if BP is elevaetd  Make sure  At goal  At home  consdier med if elevated    Below 140/90   Or lower    BP Readings from Last 3 Encounters:  04/24/16 (!) 150/82  07/22/15 110/60  05/08/15 114/76       Fu if    Worse in interim .

## 2016-05-05 DIAGNOSIS — Z961 Presence of intraocular lens: Secondary | ICD-10-CM | POA: Diagnosis not present

## 2016-05-05 DIAGNOSIS — H43813 Vitreous degeneration, bilateral: Secondary | ICD-10-CM | POA: Diagnosis not present

## 2016-05-05 DIAGNOSIS — H04123 Dry eye syndrome of bilateral lacrimal glands: Secondary | ICD-10-CM | POA: Diagnosis not present

## 2016-06-29 DIAGNOSIS — H00029 Hordeolum internum unspecified eye, unspecified eyelid: Secondary | ICD-10-CM | POA: Diagnosis not present

## 2016-07-03 ENCOUNTER — Telehealth: Payer: Self-pay

## 2016-07-03 ENCOUNTER — Ambulatory Visit (INDEPENDENT_AMBULATORY_CARE_PROVIDER_SITE_OTHER): Payer: Medicare Other | Admitting: Neurology

## 2016-07-03 ENCOUNTER — Encounter: Payer: Self-pay | Admitting: Neurology

## 2016-07-03 ENCOUNTER — Other Ambulatory Visit: Payer: Medicare Other

## 2016-07-03 VITALS — BP 120/70 | HR 77 | Temp 97.9°F | Ht 64.0 in | Wt 117.0 lb

## 2016-07-03 DIAGNOSIS — G3184 Mild cognitive impairment, so stated: Secondary | ICD-10-CM | POA: Diagnosis not present

## 2016-07-03 DIAGNOSIS — G453 Amaurosis fugax: Secondary | ICD-10-CM

## 2016-07-03 DIAGNOSIS — H532 Diplopia: Secondary | ICD-10-CM

## 2016-07-03 NOTE — Progress Notes (Signed)
NEUROLOGY CONSULTATION NOTE  Jacqueline Burgess MRN: [id number] DOB: May 10, 1940  Referring provider: Dr. Shanon Ace Primary care provider: Dr. Shanon Ace  Reason for consult:  Vision, memory changes  Dear Dr Regis Bill:  Thank you for your kind referral of Jacqueline Burgess for consultation of the above symptoms. Although her history is well known to you, please allow me to reiterate it for the purpose of our medical record. The patient was accompanied to the clinic by her daughter who also provides collateral information. Records and images were personally reviewed where available.  HISTORY OF PRESENT ILLNESS: This is a 76 yea rold right-handed woman with no significant vascular risk factors presenting for evaluation of vision changes and memory loss. She was driving home last February with her glasses on, then had sudden onset vertical diplopia that lasted a few seconds. SHe feels like a curtain of floaters went down her field of vision in both eyes. She had another episode in early winter, then again a couple of weeks ago at church when she was seeing 2 priests. They only last a few seconds, she looks again and the diplopia has resolved. There is no associated headache, no visual obscurations except she notes floaters. She reports diplopia even if she covers the left eye (monocular diplopia). She denies any significant dizziness, dysarthria/dysphagia, neck pain, focal numbness/tingling/weakness, bowel/bladder dysfunction.  With regards to memory, she has not noticed any changes herself, she denies misplacing things frequently, no missed medications. All her bills are on autopay. She does not like driving at night, denies getting lost. Her daughter reminds her of the one time she got lost leaving the hospital, she forgot where her car was. She denies any word-finding difficulties. 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 Over the past 6  months, she has been repeating herself more. Her daughter has no driving concerns, she reports the house is well-kept, no paranoia or hallucinations. Her father had Parkinsons dementia, brother was diagnosed with Alzheimer's disease in his 21s.   Laboratory Data: Lab Results  Component Value Date   WBC 7.2 05/08/2015   HGB 15.2 (H) 05/08/2015   HCT 45.4 05/08/2015   MCV 91.6 05/08/2015   PLT 178.0 05/08/2015     Chemistry      Component Value Date/Time   NA 136 05/08/2015 1115   K 4.7 05/08/2015 1115   CL 98 05/08/2015 1115   CO2 28 05/08/2015 1115   BUN 17 05/08/2015 1115   CREATININE 0.77 05/08/2015 1115      Component Value Date/Time   CALCIUM 9.2 07/22/2015 1053   CALCIUM 9.6 02/26/2012 0833   ALKPHOS 62 02/26/2012 0833   AST 24 02/26/2012 0833   ALT 19 02/26/2012 0833   BILITOT 0.7 02/26/2012 0833     Lab Results  Component Value Date   TSH 0.83 07/03/2016   Lab Results  Component Value Date   WIOXBDZH29 924 07/03/2016    PAST MEDICAL HISTORY: Past Medical History:  Diagnosis Date  . Arthritis   . Cataract    lens implant  . Closed pelvic fracture ([place]) 2012   pelvis and shoulder   . Colon polyps   . Hx of fall    fracture pelvis and left shoulder humerus  . Hx of nonmelanoma skin cancer    ssca  . Hx of syncope    had neg event monitor per dr Marya Amsler taylor in 2010  .  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 Kidney stones 1967  . MVP (mitral valve prolapse)    in records  no echo eval in records  . Osteoporosis    eval dr Buddy Duty in past dexa 11 12     PAST SURGICAL HISTORY: Past Surgical History:  Procedure Laterality Date  . CATARACT EXTRACTION     left  . CHOLECYSTECTOMY  1999  . [place]  . SHOULDER SURGERY  11/2010    MEDICATIONS: Current Outpatient Prescriptions on File Prior to Visit  Medication Sig Dispense Refill  . amitriptyline (ELAVIL) 10 MG tablet TAKE 1 TABLET EVERY NIGHT AT BEDTIME 90 tablet 1  .  betamethasone dipropionate (DIPROLENE) 0.05 % cream Apply 1 application topically 2 (two) times daily. Reported on 09/27/2015    . Calcium Carbonate-Vitamin D (CALCIUM-VITAMIN D) 500-200 MG-UNIT per tablet Take 1 tablet by mouth 2 (two) times daily with a meal. Reported on 09/27/2015    . clonazePAM (KLONOPIN) 0.5 MG tablet TAKE 1 TABLET AT BEDTIME AS NEEDED 90 tablet 0  . glucosamine-chondroitin 500-400 MG tablet Take 1 tablet by mouth daily. Reported on 09/27/2015    . Multiple Vitamins-Minerals (MULTIVITAL) tablet Take 1 tablet by mouth daily. Reported on 09/27/2015    . tretinoin (RETIN-A) 0.025 % cream Reported on 09/27/2015    . IBUPROFEN PO Take by mouth. 1 OR 2 DAILY PRN     No current facility-administered medications on file prior to visit.     ALLERGIES: Allergies  Allergen Reactions  . Pseudoephedrine Hcl Other (See Comments)    Other Reaction: Other reaction  . Sulfa Antibiotics     FAMILY HISTORY: Family History  Problem Relation Age of Onset  . CVA Mother     Died in her 29H complications rheumatic  fever afterwards when she got a stroke  . Lung cancer Sister     Died in 45s was a smoker  . Rheumatic fever Mother   . Thyroid cancer Daughter     Papillary    SOCIAL HISTORY: Social History   Social History  . Marital status: Married    Spouse name: N/A  . Number of children: N/A  . Years of education: N/A   Occupational History  . Not on file.   Social History Main Topics  . Smoking status: Never Smoker  . Smokeless tobacco: Never Used  . Alcohol use 1.8 oz/week    3 Standard drinks or equivalent per week  . Drug use: No  . Sexual activity: Yes    Partners: Female   Other Topics Concern  . 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   g4 P4   Regular  exercise: walks daily   Caffeine use: 2 cups of coffee daily          REVIEW OF SYSTEMS: Constitutional: No fevers, chills, or sweats, no generalized fatigue, change in appetite Eyes: No visual changes, double vision, eye pain Ear, nose and throat: No hearing loss, ear pain, nasal congestion, sore throat Cardiovascular: No chest pain, palpitations Respiratory:  No shortness of breath at rest or with exertion, wheezes GastrointestinaI: No nausea, vomiting, diarrhea, abdominal pain, fecal incontinence Genitourinary:  No dysuria, urinary retention or frequency Musculoskeletal:  No  neck pain, +back pain Integumentary: No rash, pruritus, skin lesions Neurological: as above Psychiatric: No depression, insomnia, anxiety Endocrine: No palpitations, fatigue, diaphoresis, mood swings, change in appetite, change in weight, increased thirst Hematologic/Lymphatic:  No anemia, purpura, petechiae. Allergic/Immunologic: no itchy/runny eyes, nasal congestion, recent allergic reactions, rashes  PHYSICAL EXAM: Vitals:   07/03/16 1256  BP: 120/70  Pulse: 77  Temp: 97.9 F (36.6 C)   General: No acute distress Head:  Normocephalic/atraumatic Eyes: Fundoscopic exam shows bilateral sharp discs, no vessel changes, exudates, or hemorrhages Neck: supple, no paraspinal tenderness, full range of motion Back: No paraspinal tenderness Heart: regular rate and rhythm Lungs: Clear to auscultation bilaterally. Vascular: No carotid bruits. Skin/Extremities: No rash, no edema Neurological Exam: Mental status: alert and oriented to person, place, and time, no dysarthria or aphasia, Fund of knowledge is appropriate.  Recent and remote memory are intact.  Attention and concentration are normal.    Able to name objects and repeat phrases.  Montreal Cognitive Assessment  07/03/2016  Visuospatial/ Executive (0/5) 5  Naming (0/3) 3  Attention: Read list of digits (0/2) 2  Attention: Read list of letters (0/1) 1    Attention: Serial 7 subtraction starting at 100 (0/3) 3  Language: Repeat phrase (0/2) 2  Language : Fluency (0/1) 1  Abstraction (0/2) 2  Delayed Recall (0/5) 0  Orientation (0/6) 4  Total 23   Cranial nerves: CN I: not tested CN II: pupils equal, round and reactive to light, visual fields intact, fundi unremarkable. CN III, IV, VI:  full range of motion, no nystagmus, no fatigable ptosis CN V: facial sensation intact CN VII: upper and lower face symmetric CN VIII: hearing intact to finger rub CN IX, X: gag intact, uvula midline CN XI: sternocleidomastoid and trapezius muscles intact CN XII: tongue midline Bulk & Tone: normal, no fasciculations. Motor: 5/5 throughout with no pronator drift. Sensation: intact to light touch, cold, pin, vibration and joint position sense.  No extinction to double simultaneous stimulation.  Romberg test negative Deep Tendon Reflexes: +2 throughout, no ankle clonus Plantar responses: downgoing bilaterally Cerebellar: no incoordination on finger to nose, heel to shin. No dysdiadochokinesia Gait: narrow-based and steady, able to tandem walk adequately. Tremor: none  IMPRESSION: This is a 76 year old right-handed woman with no significant vascular risk factors presenting with recurrent episodes of transient vertical diplopia, as well as a "shade of floaters" going down her line of vision. She reports having an evaluation with her eye doctor. The etiology of her symptoms is unclear, MRI brain without contrast will be ordered to assess for underlying structural abnormality. She reports diplopia even when covering one eye (monocular diplopia), which is usually a primary ocular issue. With her report of a shade going down her eyes, carotid dopplers will be ordered for possibility of amaurosis fugax. Bloodwork with CRP, ESR, and myasthenia panel will be done. 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 We discussed the importance of control of vascular risk factors, physical exercise, as well as brain stimulation exercises for brain health. She will follow-up in 6 months and knows to call for any changes.   Thank you for allowing me to participate in the care of this patient. Please do not hesitate to call for any questions or concerns.   Ellouise Newer, M.D.  CC: Dr. Regis Bill

## 2016-07-03 NOTE — Telephone Encounter (Signed)
Called [employer] regarding patients MRI.  No pre-cert is required.

## 2016-07-03 NOTE — Patient Instructions (Addendum)
1. Bloodwork for TSH, B12, ESR, CRP, myasthenia panel 2. Schedule MRI brain without contrast 3. Schedule carotid dopplers 4. Control of blood pressure, cholesterol, as well as physical exercise and brain stimulation exercises are important for brain health 5. Follow-up in 6 months, call for any changes

## 2016-07-04 LAB — VITAMIN B12: VITAMIN B 12: 486 pg/mL (ref 200–1100)

## 2016-07-04 LAB — SEDIMENTATION RATE: SED RATE: 3 mm/h (ref 0–30)

## 2016-07-04 LAB — TSH: TSH: 0.83 m[IU]/L

## 2016-07-06 LAB — STRIATED MUSCLE ANTIBODY: Striated Muscle Ab: <1:40 {titer}

## 2016-07-06 LAB — C-REACTIVE PROTEIN: CRP: 1.7 mg/L (ref <8.0)

## 2016-07-09 LAB — ACETYLCHOLINE RECEPTOR, BINDING: A CHR BINDING ABS: <0.3 nmol/L

## 2016-07-12 ENCOUNTER — Encounter: Payer: Self-pay | Admitting: Neurology

## 2016-07-13 ENCOUNTER — Telehealth: Payer: Self-pay

## 2016-07-13 NOTE — Telephone Encounter (Signed)
Called pt and relayed resent lab results.

## 2016-07-13 NOTE — Telephone Encounter (Signed)
-----   Message from Cameron Sprang, MD sent at 07/10/2016 10:47 AM EDT ----- Pls let her know bloodwork is normal, thanks!

## 2016-07-13 NOTE — Telephone Encounter (Signed)
Called pt to relay bloodwork results.  Voicemail box is full, unable to leave message

## 2016-07-15 ENCOUNTER — Ambulatory Visit
Admission: RE | Admit: 2016-07-15 | Discharge: 2016-07-15 | Disposition: A | Discharge status: Home / Self Care | Payer: Medicare Other | Source: Ambulatory Visit | Attending: Neurology | Admitting: Neurology

## 2016-07-15 DIAGNOSIS — G453 Amaurosis fugax: Secondary | ICD-10-CM | POA: Diagnosis not present

## 2016-07-15 DIAGNOSIS — H532 Diplopia: Secondary | ICD-10-CM

## 2016-07-15 DIAGNOSIS — G3184 Mild cognitive impairment, so stated: Secondary | ICD-10-CM

## 2016-07-17 ENCOUNTER — Telehealth: Payer: Self-pay

## 2016-07-17 NOTE — Telephone Encounter (Signed)
-----   Message from Cameron Sprang, MD sent at 07/17/2016  8:44 AM EDT ----- Pls let her know the MRI did not show any new stroke, no tumor or bleed. It was similar to her brain scan in 2004. Thanks

## 2016-07-17 NOTE — Telephone Encounter (Signed)
LMOM asking pt to return my call. °

## 2016-07-17 NOTE — Telephone Encounter (Signed)
LMOM asking pt to return my call for resent MRI results

## 2016-07-17 NOTE — Telephone Encounter (Signed)
Patient returning your call.  Thanks.

## 2016-07-20 NOTE — Telephone Encounter (Signed)
Patient returned your call on Friday, but office was already closed. Thanks

## 2016-07-21 ENCOUNTER — Telehealth: Payer: Self-pay | Admitting: Internal Medicine

## 2016-07-21 ENCOUNTER — Ambulatory Visit: Payer: Medicare Other | Admitting: Internal Medicine

## 2016-07-21 NOTE — Telephone Encounter (Signed)
Patient no showed today's appt. Please advise on how to follow up. °A. No follow up necessary. °B. Follow up urgent. Contact patient immediately. °C. Follow up necessary. Contact patient and schedule visit in ___ days. °D. Follow up advised. Contact patient and schedule visit in ____weeks. ° °

## 2016-07-21 NOTE — Telephone Encounter (Signed)
1year

## 2016-07-23 ENCOUNTER — Telehealth: Payer: Self-pay | Admitting: Neurology

## 2016-07-23 ENCOUNTER — Ambulatory Visit
Admission: RE | Admit: 2016-07-23 | Discharge: 2016-07-23 | Disposition: A | Discharge status: Home / Self Care | Payer: Medicare Other | Source: Ambulatory Visit | Attending: Neurology | Admitting: Neurology

## 2016-07-23 DIAGNOSIS — G453 Amaurosis fugax: Secondary | ICD-10-CM

## 2016-07-23 DIAGNOSIS — I63233 Cerebral infarction due to unspecified occlusion or stenosis of bilateral carotid arteries: Secondary | ICD-10-CM | POA: Diagnosis not present

## 2016-07-23 DIAGNOSIS — H532 Diplopia: Secondary | ICD-10-CM

## 2016-07-23 DIAGNOSIS — G3184 Mild cognitive impairment, so stated: Secondary | ICD-10-CM

## 2016-07-23 NOTE — Telephone Encounter (Signed)
Returned pt's daughter's call.  Let her know that I have been trying to get in touch with pt to let her know that her MRI results were normal, with no signs of stroke, bleed and very much similar to her MRI back in 2004.  That I did not feel that she nor pt necessarily needed to come in to go over normal results with Dr. Delice Lesch.  Her concern is that she does not remember the MRI in 2004 being "this bad".    She has the results from recent MRI printed.  I told her that these are extremely hard to interpret, which is why we do not give results until after the provider interprets for [place].  She still would like a call from Dr. Delice Lesch about this.

## 2016-07-23 NOTE — Telephone Encounter (Signed)
Jacqueline Burgess's daughter called back to let you know that she is sorry that she missed your call. She said she will not be available this afternoon around 4, but she will be in the morning. Thank you

## 2016-07-23 NOTE — Telephone Encounter (Signed)
Left VM for daughter Nunzio Cory [phone])

## 2016-07-23 NOTE — Telephone Encounter (Signed)
Caller: PT's daughter  Urgent? No  Reason for the call: Wants to know if her mom should come in to go over test results CB# [phone]

## 2016-07-27 NOTE — Telephone Encounter (Signed)
Spoke to daughter clarifying results of the brain MRI and head CT in 2004. Also discussed unremarkable carotid dopplers. We discussed mild intimal thickening on dopplers, her cholesterol level was elevated in March 2017, they will call Dr. Regis Bill office. Start daily aspirin 81mg . Daughter is very concerned about her falls, recommended PT for balance therapy but she feels her mother does not need it. Her daughter had a lot of questions which were answered to the best of my ability, she is very concerned about how the old stroke was not mentioned in 2004.

## 2016-07-28 ENCOUNTER — Telehealth: Payer: Self-pay | Admitting: Internal Medicine

## 2016-07-28 DIAGNOSIS — E785 Hyperlipidemia, unspecified: Secondary | ICD-10-CM

## 2016-07-28 NOTE — Telephone Encounter (Signed)
Daughter Nunzio Cory would like Dr Regis Bill to look at pt's MRI results and advise if there are any labs pt needs to have done.  Nunzio Cory states Dr Delice Lesch told her to have pt's lipid panel checked.   Daughter would like a call back to discuss.

## 2016-07-28 NOTE — Telephone Encounter (Signed)
Please advise 

## 2016-07-29 NOTE — Telephone Encounter (Signed)
Ok to order fasting  Lipid panel  I put in th order   haver her make lab appt and  rov  As she sees fit.

## 2016-07-29 NOTE — Telephone Encounter (Signed)
I dont see anything worrisome in the mri or carotid   Studies . I think she advised control of  Regular CV risk factors .   Exercise sleep  And bp control  We can repeat  Lipid panel at any time  Bu t no complelling evidence of going on medication at this time.    BP Readings from Last 3 Encounters:  07/03/16 120/70  04/24/16 (!) 150/82  07/22/15 110/60

## 2016-07-29 NOTE — Telephone Encounter (Signed)
Pt daughter Nunzio Cory (okay per [facility]) wants to have labs put in for the lipid panel. Okay to put in orders and schedule an appointment?

## 2016-07-30 NOTE — Telephone Encounter (Signed)
Spoke with pt and appointment has been scheduled for lab. Pt will make an appointment when is finish with labs

## 2016-07-31 ENCOUNTER — Other Ambulatory Visit (INDEPENDENT_AMBULATORY_CARE_PROVIDER_SITE_OTHER): Payer: Medicare Other

## 2016-07-31 DIAGNOSIS — E785 Hyperlipidemia, unspecified: Secondary | ICD-10-CM

## 2016-07-31 LAB — LIPID PANEL
CHOL/HDL RATIO: 3
Cholesterol: 211 mg/dL — ABNORMAL HIGH (ref 0–200)
HDL: 68.4 mg/dL (ref >39.00)
LDL Cholesterol: 126 mg/dL — ABNORMAL HIGH (ref 0–99)
NONHDL: 142.54
Triglycerides: 83 mg/dL (ref 0.0–149.0)
VLDL: 16.6 mg/dL (ref 0.0–40.0)

## 2016-08-04 ENCOUNTER — Telehealth: Payer: Self-pay | Admitting: Internal Medicine

## 2016-08-04 NOTE — Telephone Encounter (Signed)
Pt daughter is requesting her mom blood work results

## 2016-08-04 NOTE — Telephone Encounter (Signed)
Spoke with daughter nothing further needed

## 2016-08-13 ENCOUNTER — Telehealth: Payer: Self-pay | Admitting: Internal Medicine

## 2016-08-13 NOTE — Telephone Encounter (Signed)
Pt daughter would like to know if its ok for her mom to take low dose ASA 81 mg daily. Her neurologist put her on ASA

## 2016-08-14 NOTE — Telephone Encounter (Signed)
Please advise 

## 2016-08-16 NOTE — Telephone Encounter (Signed)
Ok to take low doe asa 81 mg per day as lontg as not having any bleeding issues.   However  Ibuprofen an asa  Can increase risk of bleeding so would limit use of  nsaids such as ibuprofen while on ASA

## 2016-08-17 NOTE — Telephone Encounter (Signed)
Left a VM for pt to give the office a call back  

## 2016-08-18 NOTE — Telephone Encounter (Signed)
Spoke with pt daughter and explained Dr. Regis Bill recommendations. Pt daughter understood and had no further questions

## 2016-09-11 ENCOUNTER — Other Ambulatory Visit: Payer: Self-pay | Admitting: Internal Medicine

## 2016-09-14 ENCOUNTER — Telehealth: Payer: Self-pay | Admitting: Emergency Medicine

## 2016-09-14 NOTE — Telephone Encounter (Signed)
Sent in a 90 day supply for Elavil. Pt needs to schedule an appointment for medication management further refills for the month of September. Please help patient schedule.

## 2016-09-14 NOTE — Telephone Encounter (Signed)
Can refill one each   \BUT tell her  I would like to see if she can wean down on the  clonazepam to 1/2 or use only 4 nights per week   .  Thi is a high risk medication

## 2016-09-14 NOTE — Telephone Encounter (Signed)
Pt has been sch

## 2016-09-15 ENCOUNTER — Telehealth: Payer: Self-pay | Admitting: Emergency Medicine

## 2016-09-15 NOTE — Telephone Encounter (Signed)
Pt needs refill today going out of town soon

## 2016-09-15 NOTE — Telephone Encounter (Signed)
Spoke with patient and gave her Dr. Regis Bill recommendations on weaning down on the medication clonazepam to 1/2 or only using 4 nights weekly. Patients states she is taking 1/2 nightly and understands.

## 2016-09-18 ENCOUNTER — Telehealth: Payer: Self-pay

## 2016-09-18 NOTE — Telephone Encounter (Signed)
Called and left vm requesting patient call back to discuss prolia- need to know if she is interested in getting this injection

## 2016-10-07 NOTE — Telephone Encounter (Signed)
Patient called to advise that she is no longer coming to Endo for Prolia. She requests that we change our system to reflect that she is d/c prolia with [place].

## 2016-10-07 NOTE — Telephone Encounter (Signed)
This has been noted.

## 2016-11-06 NOTE — Progress Notes (Signed)
Chief Complaint  Patient presents with  . Follow-up    HPI: Jacqueline Burgess 76 y.o. come in for [facility]  management  Here with husband today  Had neuro  evaluation  For diplopia sx   Had no acute findings on mri but microvascular changes and old l temporal findings  Now taking asa  Right great toe. From old injury growing  Deviation  No ulcer may get foot eval.   meds : amitryp 10 for years and tried to change and couldn't sleep  I really do the best on this and denies side effects Taking a half a pill of the clonazepam at night also help sleep has been doing this for a number of years. She denies any falling and denies any next day mental fogginess and feels that she functions well. In regard to her memory family does have concerns and states that she has problem short-term memory There is also the issue of decreased hearing she's had hearing aids for 10 years but has not been using them for the last 2 or 3 because she doesn't think they work in her "dead"she is planning on getting a new evaluation. No major vision changes currently for functioning. She does have a follow-up visit with neurology in October. 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  No acute intracranial findings.  BILATERAL cataract extraction, but no intrinsic ocular or orbital findings are seen.   Electronically Signed   By: Staci Righter M.D.   On: 07/15/2016 14:45 ROS: See pertinent positives and negatives per HPI.  Past Medical History:  Diagnosis Date  . Arthritis   . Cataract    lens implant  . Closed pelvic fracture ([place]) 2012   pelvis and shoulder   . Colon polyps   . Hx of fall    fracture pelvis and left shoulder humerus  . Hx of nonmelanoma skin cancer    ssca  . Hx of syncope    had neg event monitor per dr Marya Amsler taylor in 2010  . 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 Kidney stones 1967  .  MVP (mitral valve prolapse)    in records  no echo eval in records  . Osteoporosis    eval dr Buddy Duty in past dexa 11 12     Family History  Problem Relation Age of Onset  . CVA Mother        Died in her 36R complications rheumatic  fever afterwards when she got a stroke  . Rheumatic fever Mother   . Lung cancer Sister        Died in 66s was a smoker  . Thyroid cancer Daughter        Papillary    Social History   Social History  . Marital status: Married    Spouse name: N/A  . Number of children: N/A  . Years of education: N/A   Social History Main Topics  . Smoking status: Never Smoker  . Smokeless tobacco: Never Used  . Alcohol use 1.8 oz/week    3 Standard drinks or equivalent per week  . Drug use: No  . Sexual activity: Yes    Partners: Female   Other Topics Concern  . 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   g4 P4   Regular exercise: walks daily   Caffeine use: 2 cups of coffee daily          Outpatient Medications Prior to Visit  Medication Sig Dispense Refill  . amitriptyline (ELAVIL) 10 MG tablet TAKE 1 TABLET AT BEDTIME 90 tablet 0  . betamethasone dipropionate (DIPROLENE) 0.05 % cream Apply 1 application topically 2 (two) times daily. Reported on 09/27/2015    . Calcium Carbonate-Vitamin D (CALCIUM-VITAMIN D) 500-200 MG-UNIT per tablet Take 1 tablet by mouth 2 (two) times daily with a meal. Reported on 09/27/2015    . glucosamine-chondroitin 500-400 MG tablet Take 1 tablet by mouth daily. Reported on 09/27/2015    . IBUPROFEN PO Take by mouth. 1 OR 2 DAILY PRN    . Multiple Vitamins-Minerals (MULTIVITAL) tablet Take 1 tablet by mouth daily. Reported on 09/27/2015    . tretinoin (RETIN-A) 0.025 % cream Reported on 09/27/2015    . clonazePAM (KLONOPIN) 0.5 MG  tablet TAKE 1 TABLET AT BEDTIME AS NEEDED 90 tablet 0   No facility-administered medications prior to visit.      EXAM:  BP 112/62 (BP Location: Left Arm, Patient Position: Sitting, Cuff Size: Normal)   Pulse 90   Temp 98.3 F (36.8 C) (Oral)   Ht 5\' 4"  (1.626 m)   Wt 117 lb (53.1 kg)   SpO2 97%   BMI 20.08 kg/m   Body mass index is 20.08 kg/m.  GENERAL: vitals reviewed and listed above, alert, oriented, appears well hydrated and in no acute distress HEENT: atraumatic, conjunctiva  clear, no obvious abnormalities on inspection of external nose and ears  NECK: no obvious masses on inspection palpation  LUNGS: clear to auscultation bilaterally, no wheezes, rales or rhonchi, good air movement CV: HRRR, no clubbing cyanosis or  peripheral edema nl cap refill  MS: moves all extremities without noticeable focal  Abnormality  r great toe deviated  Laterally but no ulcer or blister   PSYCH: pleasant and cooperative, no obvious depression or anxiety  BP Readings from Last 3 Encounters:  11/10/16 112/62  07/03/16 120/70  04/24/16 (!) 150/82   ASSESSMENT AND PLAN:  Discussed the following assessment and plan:  Insomnia, unspecified type  Encounter for immunization - Plan: Flu vaccine HIGH DOSE PF  High risk medication use  Medication management  Decreased hearing, unspecified laterality  Toe deformity, acquired, right Takes a very small dose of the amitriptyline and a half of clonipen  she is very hesitant to go off these medicines because then she will sweep she reports. She's tried other medicines in the past that didn't help her that much. Discussed research on silent nor however uncertain if insurance will pay. Elavil at 10 mg may be just a safe as 6 mg of doxepin but we don't have  Studies on  That    consdier change to silenor?   rx given to price out and decide on trying.  Taking 8.93 clonazepam  Uncertain addition to her  Memory issues  No falling etc.  We  discussed the importance of getting her hearing assistance optimized as this can be affecting cognition and future cognition she is going to be evaluated soon encouraged her to find a way to use hearing assistance.  Risk benefit of medication discussed.  She really wants to stay on same med cause working so well at low dose . And  denies  Se at this time .  -Patient advised to return or notify health care team  if  new concerns arise. In interim  Total visit 62mins > 50% spent counseling and coordinating care as indicated in above note and in instructions to patient .   Patient Instructions  Cautious use of medication as we discussed .  Get your hearing  Aids  updated      decrease hearing may be    Affecting your cognition.   Consideration of  silenor .     which is  6 mg of doxepin has been tested for safety . Similar to the amitryptiline .     It is possible that  amitriptyline  At 10 mg   Has few side effects for you , Will follow at this time.   Yearly check up  In 6 months or as needed            Mariann Laster K. Loomis Anacker M.D.

## 2016-11-10 ENCOUNTER — Ambulatory Visit (INDEPENDENT_AMBULATORY_CARE_PROVIDER_SITE_OTHER): Payer: Medicare Other | Admitting: Internal Medicine

## 2016-11-10 ENCOUNTER — Encounter: Payer: Self-pay | Admitting: Internal Medicine

## 2016-11-10 VITALS — BP 112/62 | HR 90 | Temp 98.3°F | Ht 64.0 in | Wt 117.0 lb

## 2016-11-10 DIAGNOSIS — Z79899 Other long term (current) drug therapy: Secondary | ICD-10-CM

## 2016-11-10 DIAGNOSIS — Z23 Encounter for immunization: Secondary | ICD-10-CM

## 2016-11-10 DIAGNOSIS — G4700 Insomnia, unspecified: Secondary | ICD-10-CM | POA: Diagnosis not present

## 2016-11-10 DIAGNOSIS — M2061 Acquired deformities of toe(s), unspecified, right foot: Secondary | ICD-10-CM

## 2016-11-10 DIAGNOSIS — H9190 Unspecified hearing loss, unspecified ear: Secondary | ICD-10-CM | POA: Insufficient documentation

## 2016-11-10 MED ORDER — DOXEPIN HCL 6 MG PO TABS: 6.0000 mg | ORAL_TABLET | Freq: Every evening | ORAL | 1 refills | Status: DC | PRN

## 2016-11-10 MED ORDER — CLONAZEPAM 0.5 MG PO TABS: 0.2500 mg | ORAL_TABLET | Freq: Every evening | ORAL | 0 refills | Status: DC | PRN

## 2016-11-10 NOTE — Patient Instructions (Addendum)
Cautious use of medication as we discussed .  Get your hearing  Aids  updated      decrease hearing may be    Affecting your cognition.   Consideration of  silenor .     which is  6 mg of doxepin has been tested for safety . Similar to the amitryptiline .     It is possible that  amitriptyline  At 10 mg   Has few side effects for you , Will follow at this time.   Yearly check up  In 6 months or as needed

## 2016-11-11 ENCOUNTER — Telehealth: Payer: Self-pay | Admitting: Internal Medicine

## 2016-11-11 MED ORDER — AMITRIPTYLINE HCL 10 MG PO TABS: 10.0000 mg | ORAL_TABLET | Freq: Every day | ORAL | 2 refills | Status: DC

## 2016-11-11 NOTE — Telephone Encounter (Signed)
It was worth checking into   Elavil 90 w refills sent  Mail order pharmacy

## 2016-11-11 NOTE — Telephone Encounter (Signed)
Pt was seen yesterday and pt can not affford doxepin cost about 100.00. Pt would like to go back to generic elavil and please sent to [facility] mail order #90 w/refills

## 2016-11-11 NOTE — Telephone Encounter (Signed)
Dr. Regis Bill   Please see previous message and advise

## 2016-11-16 ENCOUNTER — Telehealth: Payer: Self-pay | Admitting: Internal Medicine

## 2016-11-16 NOTE — Telephone Encounter (Signed)
Okay to refill? 

## 2016-11-16 NOTE — Telephone Encounter (Signed)
Spoke with patient regarding Dr. Regis Bill sending in medication to mail order pharmacy. Patient understood and had no further questions

## 2016-11-16 NOTE — Telephone Encounter (Signed)
Left a VM for patient regarding phone note. Medication has already been sent in 11/11/2016. Patient needs to call pharmacy to check status

## 2016-11-16 NOTE — Telephone Encounter (Signed)
Pt need new Rx for amitriptyline 10 MG #90   Pharm:  [facility]  Pts husband state that the doxepin has never been refill due to the it been so expensive ($100.00 for 30 pills) and would like to continue with amitriptyline.

## 2016-11-16 NOTE — Telephone Encounter (Signed)
I believe I have RD answered this message please check the last phone message from last week.Okay to refill

## 2016-11-25 ENCOUNTER — Ambulatory Visit (INDEPENDENT_AMBULATORY_CARE_PROVIDER_SITE_OTHER): Payer: Medicare Other | Admitting: Internal Medicine

## 2016-11-25 ENCOUNTER — Encounter: Payer: Self-pay | Admitting: Internal Medicine

## 2016-11-25 VITALS — BP 124/78 | HR 81 | Temp 98.1°F | Wt 116.8 lb

## 2016-11-25 DIAGNOSIS — H6120 Impacted cerumen, unspecified ear: Secondary | ICD-10-CM | POA: Diagnosis not present

## 2016-11-25 DIAGNOSIS — M2061 Acquired deformities of toe(s), unspecified, right foot: Secondary | ICD-10-CM

## 2016-11-25 DIAGNOSIS — H9190 Unspecified hearing loss, unspecified ear: Secondary | ICD-10-CM | POA: Diagnosis not present

## 2016-11-25 NOTE — Progress Notes (Signed)
Chief Complaint  Patient presents with  . Acute Visit    Clogged ears - minimal hearing loss x several months. No pain.    HPI: Jacqueline Burgess 76 y.o.  [facility] told she had wax in her ears and couldn't be evaluated.Also asked for referral to podiatry about her right second toe that's overriding and she's having to put cotton balls in between to avoid blisters need some help with this.   ROS: See pertinent positives and negatives per HPI.  Past Medical History:  Diagnosis Date  . Arthritis   . Cataract    lens implant  . Closed pelvic fracture ([place]) 2012   pelvis and shoulder   . Colon polyps   . Hx of fall    fracture pelvis and left shoulder humerus  . Hx of nonmelanoma skin cancer    ssca  . Hx of syncope    had neg event monitor per dr Marya Amsler taylor in 2010  . 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 Kidney stones 1967  . MVP (mitral valve prolapse)    in records  no echo eval in records  . Osteoporosis    eval dr Buddy Duty in past dexa 11 12     Family History  Problem Relation Age of Onset  . CVA Mother        Died in her 16X complications rheumatic  fever afterwards when she got a stroke  . Rheumatic fever Mother   . Lung cancer Sister        Died in 77s was a smoker  . Thyroid cancer Daughter        Papillary    Social History   Social History  . Marital status: Married    Spouse name: N/A  . Number of children: N/A  . Years of education: N/A   Social History Main Topics  . Smoking status: Never Smoker  . Smokeless tobacco: Never Used  . Alcohol use 1.8 oz/week    3 Standard drinks or equivalent per week  . Drug use: No  . Sexual activity: Yes    Partners: Female   Other Topics Concern  . 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   g4 P4   Regular exercise: walks daily   Caffeine use: 2 cups of coffee daily          Outpatient Medications Prior to Visit  Medication Sig Dispense Refill  . amitriptyline (ELAVIL) 10 MG tablet Take 1 tablet (10 mg total) by mouth at bedtime. 90 tablet 2  . aspirin EC 81 MG tablet Take 81 mg by mouth daily.    . betamethasone dipropionate (DIPROLENE) 0.05 % cream Apply 1 application topically 2 (two) times daily. Reported on 09/27/2015    . Calcium Carbonate-Vitamin D (CALCIUM-VITAMIN D) 500-200 MG-UNIT per tablet Take 1 tablet by mouth 2 (two) times daily with a meal. Reported on 09/27/2015    . clonazePAM (KLONOPIN) 0.5 MG tablet Take 0.5 tablets (0.25 mg total) by mouth at bedtime as needed. 90 tablet 0  . Doxepin HCl 6 MG TABS Take 1 tablet (6 mg total) by mouth at bedtime as needed. 30 tablet 1  . glucosamine-chondroitin  500-400 MG tablet Take 1 tablet by mouth daily. Reported on 09/27/2015    . IBUPROFEN PO Take by mouth. 1 OR 2 DAILY PRN    . Multiple Vitamins-Minerals (MULTIVITAL) tablet Take 1 tablet by mouth daily. Reported on 09/27/2015    . tretinoin (RETIN-A) 0.025 % cream Reported on 09/27/2015     No facility-administered medications prior to visit.      EXAM:  BP 124/78 (BP Location: Right Arm, Patient Position: Sitting, Cuff Size: Normal)   Pulse 81   Temp 98.1 F (36.7 C) (Oral)   Wt 116 lb 12.8 oz (53 kg)   BMI 20.05 kg/m   Body mass index is 20.05 kg/m.  GENERAL: vitals reviewed and listed above, alert, oriented, appears well hydrated and in no acute distress HEENT: atraumatic, conjunctiva  clear, no obvious abnormalities on inspection of external nose and ears   Right eac 3+ wax irrigation  Tm intact hears better  Left mod wax removed  Tm intact but co  Of not being able to hear  On that side now  Right foot mid second toe  Overriding no blisters  PSYCH: pleasant and cooperative, no obvious depression or anxiety  ASSESSMENT AND  PLAN:  Discussed the following assessment and plan:  Decreased hearing, unspecified laterality  Toe deformity, acquired, right - Plan: Ambulatory referral to Podiatry  Wax in ear   Left TM is intact but she complains of decreased hearing like clogged after gentle irrigation. Perhaps some water affecting hearing.No other obvious causes no pain. She can call us in the morning if this isn't better we can get ENT to see her. Don't see any alarming findings. Podiatry referral as requested  -Patient advised to return or notify health care team  if symptoms worsen ,persist or new concerns arise.  Patient Instructions  Podiatry referral Ears look ok but if still having problem in am then contact us and we can do ent referral    Standley Brooking. Jaysion Ramseyer M.D.

## 2016-11-25 NOTE — Patient Instructions (Signed)
Podiatry referral Ears look ok but if still having problem in am then contact us and we can do ent referral

## 2016-12-24 ENCOUNTER — Encounter: Payer: Self-pay | Admitting: Neurology

## 2016-12-24 ENCOUNTER — Ambulatory Visit (INDEPENDENT_AMBULATORY_CARE_PROVIDER_SITE_OTHER): Payer: Medicare Other | Admitting: Neurology

## 2016-12-24 VITALS — BP 112/64 | HR 83 | Ht 63.0 in | Wt 117.0 lb

## 2016-12-24 DIAGNOSIS — G3184 Mild cognitive impairment, so stated: Secondary | ICD-10-CM | POA: Diagnosis not present

## 2016-12-24 NOTE — Progress Notes (Signed)
NEUROLOGY FOLLOW UP OFFICE NOTE  JAILANI HOGANS [id number] 06/02/1940  HISTORY OF PRESENT ILLNESS: I had the pleasure of seeing Merisa Julio in follow-up in the neurology clinic on 12/24/2016.  The patient was last seen 6 months ago for episodes of diplopia and memory changes. She is accompanied by her husband who helps supplement the history today.  Records and images were personally reviewed where available.  I personally reviewed MRI brain without contrast which did not show any acute changes. There was mild diffuse atrophy, moderate chronic microvascular disease. There was note of a remote medial right temporal lobe infarct with asymmetric volume loss and gliosis. Carotid dopplers did not show significant stenosis. Bloodwork was negative for myasthenia, CRP, ESR. B12 and TSH normal. She reports the diplopia has resolved, she mostly noticed it when she put on a specific pair of sunglasses. She feels her memory is the same as always. She always has to write things down. Her husband reports she has problems with appointments, she asks repeatedly what time her appointment is. She denies getting lost driving. Her husband is in charge of finances. She denies missing medications.   HPI 07/03/2016: This is a 76 yo RH woman with no significant vascular risk factors who presented for evaluation of vision changes and memory loss. She was driving home last February 2018 with her glasses on, then had sudden onset vertical diplopia that lasted a few seconds. SHe feels like a curtain of floaters went down her field of vision in both eyes. She had another episode in early winter, then again a couple of weeks ago at church when she was seeing 2 priests. They only last a few seconds, she looks again and the diplopia has resolved. There is no associated headache, no visual obscurations except she notes floaters. She reports diplopia even if she covers the left eye (monocular diplopia). She denies any significant  dizziness, dysarthria/dysphagia, neck pain, focal numbness/tingling/weakness, bowel/bladder dysfunction.  With regards to memory, she has not noticed any changes herself, she denies misplacing things frequently, no missed medications. All her bills are on autopay. She does not like driving at night, denies getting lost. Her daughter reminds her of the one time she got lost leaving the hospital, she forgot where her car was. She denies any word-finding difficulties. 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 Over the past 6 months, she has been repeating herself more. Her daughter has no driving concerns, she reports the house is well-kept, no paranoia or hallucinations. Her father had Parkinsons dementia, brother was diagnosed with Alzheimer's disease in his 85s.   PAST MEDICAL HISTORY: Past Medical History:  Diagnosis Date  . Arthritis   . Cataract    lens implant  . Closed pelvic fracture ([place]) 2012   pelvis and shoulder   . Colon polyps   . Hx of fall    fracture pelvis and left shoulder humerus  . Hx of nonmelanoma skin cancer    ssca  . Hx of syncope    had neg event monitor per dr Marya Amsler taylor in 2010  . 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 Kidney stones 1967  . MVP (mitral valve prolapse)    in records  no echo eval in records  . Osteoporosis    eval dr Buddy Duty in past dexa 11 12     MEDICATIONS: Current Outpatient Prescriptions on File Prior to Visit  Medication Sig Dispense Refill  . amitriptyline (ELAVIL) 10 MG tablet Take 1 tablet (10 mg total) by mouth at bedtime. 90 tablet 2  . aspirin EC 81 MG tablet Take 81 mg by mouth daily.    . betamethasone dipropionate (DIPROLENE) 0.05 % cream Apply 1 application topically 2 (two) times daily. Reported on 09/27/2015    . Calcium Carbonate-Vitamin D (CALCIUM-VITAMIN D) 500-200 MG-UNIT per tablet Take 1 tablet by mouth 2 (two) times daily with a meal. Reported on 09/27/2015      . clonazePAM (KLONOPIN) 0.5 MG tablet Take 0.5 tablets (0.25 mg total) by mouth at bedtime as needed. 90 tablet 0  . Doxepin HCl 6 MG TABS Take 1 tablet (6 mg total) by mouth at bedtime as needed. 30 tablet 1  . glucosamine-chondroitin 500-400 MG tablet Take 1 tablet by mouth daily. Reported on 09/27/2015    . IBUPROFEN PO Take by mouth. 1 OR 2 DAILY PRN    . Multiple Vitamins-Minerals (MULTIVITAL) tablet Take 1 tablet by mouth daily. Reported on 09/27/2015    . tretinoin (RETIN-A) 0.025 % cream Reported on 09/27/2015     No current facility-administered medications on file prior to visit.     ALLERGIES: Allergies  Allergen Reactions  . Pseudoephedrine Hcl Other (See Comments)    Other Reaction: Other reaction  . Sulfa Antibiotics     FAMILY HISTORY: Family History  Problem Relation Age of Onset  . CVA Mother        Died in her 46K complications rheumatic  fever afterwards when she got a stroke  . Rheumatic fever Mother   . Lung cancer Sister        Died in 29s was a smoker  . Thyroid cancer Daughter        Papillary    SOCIAL HISTORY: Social History   Social History  . Marital status: Married    Spouse name: N/A  . Number of children: N/A  . Years of education: N/A   Occupational History  . Not on file.   Social History Main Topics  . Smoking status: Never Smoker  . Smokeless tobacco: Never Used  . Alcohol use 1.8 oz/week    3 Standard drinks or equivalent per week  . Drug use: No  . Sexual activity: Yes    Partners: Female   Other Topics Concern  . 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   g4 P4   Regular exercise: walks daily   Caffeine use: 2 cups of coffee daily          REVIEW OF SYSTEMS: Constitutional: No fevers, chills, or sweats, no  generalized fatigue, change in appetite Eyes: No visual changes, double vision, eye pain Ear, nose and throat: No hearing loss, ear pain, nasal congestion, sore throat Cardiovascular: No chest pain, palpitations Respiratory:  No shortness of breath at rest or with exertion, wheezes GastrointestinaI: No nausea, vomiting, diarrhea, abdominal pain, fecal incontinence Genitourinary:  No dysuria, urinary retention or frequency Musculoskeletal:  No neck pain, back pain Integumentary: No rash, pruritus, skin lesions Neurological: as above Psychiatric: No depression, insomnia, anxiety Endocrine: No palpitations, fatigue, diaphoresis, mood swings, change in appetite, change in weight, increased thirst Hematologic/Lymphatic:  No anemia, purpura, petechiae. Allergic/Immunologic: no itchy/runny eyes, nasal  congestion, recent allergic reactions, rashes  PHYSICAL EXAM: Vitals:   12/24/16 1116  BP: 112/64  Pulse: 83  SpO2: 98%   General: No acute distress Head:  Normocephalic/atraumatic Neck: supple, no paraspinal tenderness, full range of motion Heart:  Regular rate and rhythm Lungs:  Clear to auscultation bilaterally Back: No paraspinal tenderness Skin/Extremities: No rash, no edema Neurological Exam: alert and oriented to person, place, and time. No aphasia or dysarthria. Fund of knowledge is appropriate.  Recent and remote memory are intact.  Attention and concentration are normal.    Able to name objects and repeat phrases.  MMSE - Mini Mental State Exam 12/24/2016  Orientation to time 2  Orientation to Place 4  Registration 3  Attention/ Calculation 5  Recall 1  Language- name 2 objects 2  Language- repeat 1  Language- follow 3 step command 3  Language- read & follow direction 1  Write a sentence 1  Copy design 1  Total score 24   Cranial nerves: Pupils equal, round, reactive to light.  Extraocular movements intact with no nystagmus. Visual fields full. Facial sensation intact. No  facial asymmetry. Tongue, uvula, palate midline.  Motor: Bulk and tone normal, muscle strength 5/5 throughout with no pronator drift.  Sensation to light touch intact.  No extinction to double simultaneous stimulation.  Deep tendon reflexes 2+ throughout, toes downgoing.  Finger to nose testing intact.  Gait narrow-based and steady, able to tandem walk adequately.  Romberg negative.  IMPRESSION: This is a 76 year old right-handed woman with no significant vascular risk factors who presented with recurrent episodes of transient vertical diplopia, as well as a "shade of floaters" going down her line of vision. MRI brain did not show any acute changes, carotid dopplers unremarkable. ESR, CRP, myasthenia panel negative. She denies any further diplopia since her last visit, continue follow-up with eye doctor. She was also reporting memory issues, MMSE today 24/30, symptoms suggestive of mild cognitive impairment. We discussed the option of starting Aricept, she would like to hold off for now and will follow-up in 6 months. We discussed old stroke on MRI brain, continue daily aspirin, control of vascular risk factors. She knows to call for any changes.   Thank you for allowing me to participate in her care.  Please do not hesitate to call for any questions or concerns.  The duration of this appointment visit was 25 minutes of face-to-face time with the patient.  Greater than 50% of this time was spent in counseling, explanation of diagnosis, planning of further management, and coordination of care.   Ellouise Newer, M.D.   CC: Dr. Regis Bill

## 2016-12-24 NOTE — Patient Instructions (Signed)
  RECOMMENDATIONS FOR ALL PATIENTS WITH MEMORY PROBLEMS: 1. Continue to exercise (Recommend 30 minutes of walking everyday, or 3 hours every week) 2. Increase social interactions - continue going to [place] and enjoy social gatherings with friends and family 3. Eat healthy, avoid fried foods and eat more fruits and vegetables 4. Maintain adequate blood pressure, blood sugar, and blood cholesterol level. Reducing the risk of stroke and cardiovascular disease also helps promoting better memory. 5. Avoid stressful situations. Live a simple life and avoid aggravations. Organize your time and prepare for the next day in anticipation. 6. Sleep well, avoid any interruptions of sleep and avoid any distractions in the bedroom that may interfere with adequate sleep quality 7. Avoid sugar, avoid sweets as there is a strong link between excessive sugar intake, diabetes, and cognitive impairment We discussed the Mediterranean diet, which has been shown to help patients reduce the risk of progressive memory disorders and reduces cardiovascular risk. This includes eating fish, eat fruits and green leafy vegetables, nuts like almonds and hazelnuts, walnuts, and also use olive oil. Avoid fast foods and fried foods as much as possible. Avoid sweets and sugar as sugar use has been linked to worsening of memory function.  There is always a concern of gradual progression of memory problems. If this is the case, then we may need to adjust level of care according to patient needs. Support, both to the patient and caregiver, should then be put into place.

## 2017-01-20 DIAGNOSIS — L821 Other seborrheic keratosis: Secondary | ICD-10-CM | POA: Diagnosis not present

## 2017-01-20 DIAGNOSIS — Z85828 Personal history of other malignant neoplasm of skin: Secondary | ICD-10-CM | POA: Diagnosis not present

## 2017-01-20 DIAGNOSIS — L218 Other seborrheic dermatitis: Secondary | ICD-10-CM | POA: Diagnosis not present

## 2017-01-20 DIAGNOSIS — L820 Inflamed seborrheic keratosis: Secondary | ICD-10-CM | POA: Diagnosis not present

## 2017-01-20 DIAGNOSIS — D225 Melanocytic nevi of trunk: Secondary | ICD-10-CM | POA: Diagnosis not present

## 2017-01-20 DIAGNOSIS — D1801 Hemangioma of skin and subcutaneous tissue: Secondary | ICD-10-CM | POA: Diagnosis not present

## 2017-01-20 DIAGNOSIS — L570 Actinic keratosis: Secondary | ICD-10-CM | POA: Diagnosis not present

## 2017-02-17 DIAGNOSIS — H00025 Hordeolum internum left lower eyelid: Secondary | ICD-10-CM | POA: Diagnosis not present

## 2017-04-29 ENCOUNTER — Telehealth: Payer: Self-pay | Admitting: Internal Medicine

## 2017-04-30 NOTE — Telephone Encounter (Signed)
Last filled 11/10/16 #90 Last seen 11/25/16 for regular OV  Please advise Dr Regis Bill, thanks.

## 2017-04-30 NOTE — Telephone Encounter (Signed)
Ok to refill x 1   OV  Before runs out  Of medication

## 2017-04-30 NOTE — Telephone Encounter (Signed)
Rx refilled to [facility]. Note attached for OV for further refills.

## 2017-05-29 DIAGNOSIS — M545 Low back pain: Secondary | ICD-10-CM | POA: Diagnosis not present

## 2017-05-31 DIAGNOSIS — M47816 Spondylosis without myelopathy or radiculopathy, lumbar region: Secondary | ICD-10-CM | POA: Diagnosis not present

## 2017-05-31 DIAGNOSIS — M810 Age-related osteoporosis without current pathological fracture: Secondary | ICD-10-CM | POA: Diagnosis not present

## 2017-06-03 DIAGNOSIS — M545 Low back pain: Secondary | ICD-10-CM | POA: Diagnosis not present

## 2017-06-07 DIAGNOSIS — M48061 Spinal stenosis, lumbar region without neurogenic claudication: Secondary | ICD-10-CM | POA: Diagnosis not present

## 2017-06-23 ENCOUNTER — Telehealth: Payer: Self-pay | Admitting: Internal Medicine

## 2017-06-23 NOTE — Telephone Encounter (Signed)
Copied from [place] [id number]. Topic: Quick Communication - Rx Refill/Question >> Jun 23, 2017 11:10 AM Synthia Innocent wrote: Medication: clonazePAM (KLONOPIN) 0.5 MG tablet  Has the patient contacted their pharmacy? Yes.   (Agent: If no, request that the patient contact the pharmacy for the refill.) Preferred Pharmacy (with phone number or street name): [facility] Agent: Please be advised that RX refills may take up to 3 business days. We ask that you follow-up with your pharmacy.

## 2017-06-23 NOTE — Telephone Encounter (Signed)
Please advise Dr Panosh, thanks.   

## 2017-06-23 NOTE — Telephone Encounter (Signed)
Klonopin refill request  PCP:  Panosh  LOV 11/25/16 with Dr. Regis Bill  [facility] - [place], [place]

## 2017-06-24 ENCOUNTER — Ambulatory Visit (INDEPENDENT_AMBULATORY_CARE_PROVIDER_SITE_OTHER): Payer: Medicare Other | Admitting: Neurology

## 2017-06-24 ENCOUNTER — Encounter: Payer: Self-pay | Admitting: Neurology

## 2017-06-24 ENCOUNTER — Other Ambulatory Visit: Payer: Self-pay

## 2017-06-24 VITALS — BP 122/70 | HR 91 | Ht 63.0 in | Wt 114.0 lb

## 2017-06-24 DIAGNOSIS — G3184 Mild cognitive impairment, so stated: Secondary | ICD-10-CM | POA: Diagnosis not present

## 2017-06-24 NOTE — Progress Notes (Signed)
NEUROLOGY FOLLOW UP OFFICE NOTE  TAJANAE Burgess [id number] [birth year]  HISTORY OF PRESENT ILLNESS: I had the pleasure of seeing Jacqueline Burgess in follow-up in the neurology clinic on 06/24/2017.  The patient was last seen 79 months ago for episodes of diplopia and memory changes. She is again accompanied by her husband who helps supplement the history today.  MRI brain without contrast did not show any acute changes. There was mild diffuse atrophy, moderate chronic microvascular disease. There was note of a remote medial right temporal lobe infarct with asymmetric volume loss and gliosis. Carotid dopplers did not show significant stenosis. Bloodwork was negative for myasthenia, CRP, ESR. B12 and TSH normal. She denies any further episodes of diplopia. She and her husband feel her memory has been stable. She asks the same questions repeatedly, and gets irritable when he gets irritated with her. Sometimes she tells him she does not hear him, which he thinks is a defense mechanism. She denies getting lost driving. No missed medications. Her husband is in charge of finances. She writes things down as a reminder. She denies any headaches, dizziness, vision changes, focal numbness/tingling/weakness, no falls.  HPI 07/03/2016: This is a 77 yo RH woman with no significant vascular risk factors who presented for evaluation of vision changes and memory loss. She was driving home last February 2018 with her glasses on, then had sudden onset vertical diplopia that lasted a few seconds. SHe feels like a curtain of floaters went down her field of vision in both eyes. She had another episode in early winter, then again a couple of weeks ago at church when she was seeing 2 priests. They only last a few seconds, she looks again and the diplopia has resolved. There is no associated headache, no visual obscurations except she notes floaters. She reports diplopia even if she covers the left eye (monocular diplopia). She  denies any significant dizziness, dysarthria/dysphagia, neck pain, focal numbness/tingling/weakness, bowel/bladder dysfunction.  With regards to memory, she has not noticed any changes herself, she denies misplacing things frequently, no missed medications. All her bills are on autopay. She does not like driving at night, denies getting lost. Her daughter reminds her of the one time she got lost leaving the hospital, she forgot where her car was. She denies any word-finding difficulties. 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 Over the past 6 months, she has been repeating herself more. Her daughter has no driving concerns, she reports the house is well-kept, no paranoia or hallucinations. Her father had Parkinsons dementia, brother was diagnosed with Alzheimer's disease in his 70s.   PAST MEDICAL HISTORY: Past Medical History:  Diagnosis Date  . Arthritis   . Cataract    lens implant  . Closed pelvic fracture ([place]) 2012   pelvis and shoulder   . Colon polyps   . Hx of fall    fracture pelvis and left shoulder humerus  . Hx of nonmelanoma skin cancer    ssca  . Hx of syncope    had neg event monitor per dr Marya Amsler taylor in 2010  . 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 Kidney stones 1967  . MVP (mitral valve prolapse)    in records  no echo eval in records  . Osteoporosis    eval dr Buddy Duty in past dexa 11 12     MEDICATIONS: Current Outpatient Medications on File Prior to Visit  Medication Sig Dispense Refill  . amitriptyline (ELAVIL) 10 MG tablet Take 1 tablet (10 mg total) by mouth at bedtime. 90 tablet 2  . aspirin EC 81 MG tablet Take 81 mg by mouth daily.    . betamethasone dipropionate (DIPROLENE) 0.05 % cream Apply 1 application topically 2 (two) times daily. Reported on 09/27/2015    . Calcium Carbonate-Vitamin D (CALCIUM-VITAMIN D) 500-200 MG-UNIT per tablet Take 1 tablet by mouth 2 (two) times daily with a meal.  Reported on 09/27/2015    . clonazePAM (KLONOPIN) 0.5 MG tablet Take 1 tablet (0.5 mg total) by mouth at bedtime as needed. **NEEDS OV FOR FURTHER REFILLS. 30 tablet 0  . Doxepin HCl 6 MG TABS Take 1 tablet (6 mg total) by mouth at bedtime as needed. 30 tablet 1  . glucosamine-chondroitin 500-400 MG tablet Take 1 tablet by mouth daily. Reported on 09/27/2015    . IBUPROFEN PO Take by mouth. 1 OR 2 DAILY PRN    . meloxicam (MOBIC) 7.5 MG tablet TK 1 T PO BID  0  . Multiple Vitamins-Minerals (MULTIVITAL) tablet Take 1 tablet by mouth daily. Reported on 09/27/2015    . traMADol (ULTRAM) 50 MG tablet TK 1 T PO Q 6 H  0  . tretinoin (RETIN-A) 0.025 % cream Reported on 09/27/2015     No current facility-administered medications on file prior to visit.     ALLERGIES: Allergies  Allergen Reactions  . Pseudoephedrine Hcl Other (See Comments)    Other Reaction: Other reaction  . Sulfa Antibiotics     FAMILY HISTORY: Family History  Problem Relation Age of Onset  . CVA Mother        Died in her 34V complications rheumatic  fever afterwards when she got a stroke  . Rheumatic fever Mother   . Lung cancer Sister        Died in 61s was a smoker  . Thyroid cancer Daughter        Papillary    SOCIAL HISTORY: Social History   Socioeconomic History  . Marital status: Married    Spouse name: Not on file  . Number of children: Not on file  . Years of education: Not on file  . Highest education level: Not on file  Occupational History  . Not on file  Social Needs  . Financial resource strain: Not on file  . Food insecurity:    Worry: Not on file    Inability: Not on file  . Transportation needs:    Medical: Not on file    Non-medical: Not on file  Tobacco Use  . Smoking status: Never Smoker  . Smokeless tobacco: Never Used  Substance and Sexual Activity  . Alcohol use: Yes    Alcohol/week: 1.8 oz    Types: 3 Standard drinks or equivalent per week  . Drug use: No  . Sexual activity:  Yes    Partners: Female  Lifestyle  . Physical activity:    Days per week: Not on file    Minutes per session: Not on file  . Stress: Not on file  Relationships  . Social connections:    Talks on phone: Not on file    Gets together: Not on file    Attends religious service: Not on file    Active member of club or organization: Not on file    Attends meetings of clubs or organizations: Not on file    Relationship status: Not on file  . Intimate  partner violence:    Fear of current or ex partner: Not on file    Emotionally abused: Not on file    Physically abused: Not on file    Forced sexual activity: Not on file  Other Topics Concern  . 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   g4 P4   Regular exercise: walks daily   Caffeine use: 2 cups of coffee daily       REVIEW OF SYSTEMS: Constitutional: No fevers, chills, or sweats, no generalized fatigue, change in appetite Eyes: No visual changes, double vision, eye pain Ear, nose and throat: No hearing loss, ear pain, nasal congestion, sore throat Cardiovascular: No chest pain, palpitations Respiratory:  No shortness of breath at rest or with exertion, wheezes GastrointestinaI: No nausea, vomiting, diarrhea, abdominal pain, fecal incontinence Genitourinary:  No dysuria, urinary retention or frequency Musculoskeletal:  No neck pain, back pain Integumentary: No rash, pruritus, skin lesions Neurological: as above Psychiatric: No depression, insomnia, anxiety Endocrine: No palpitations, fatigue, diaphoresis, mood swings, change in appetite, change in weight, increased thirst Hematologic/Lymphatic:  No anemia, purpura, petechiae. Allergic/Immunologic: no itchy/runny eyes, nasal congestion, recent allergic reactions,  rashes  PHYSICAL EXAM: Vitals:   06/24/17 1134  BP: 122/70  Pulse: 91  SpO2: 98%   General: No acute distress Head:  Normocephalic/atraumatic Neck: supple, no paraspinal tenderness, full range of motion Heart:  Regular rate and rhythm Lungs:  Clear to auscultation bilaterally Back: No paraspinal tenderness Skin/Extremities: No rash, no edema Neurological Exam: alert and oriented to person, place, day/month/season. States year is 2020. No aphasia or dysarthria. Fund of knowledge is appropriate.  Recent and remote memory are intact.  Attention and concentration are normal.    Able to name objects and repeat phrases. CDT 5/5 MMSE - Mini Mental State Exam 06/24/2017 12/24/2016  Orientation to time 3 2  Orientation to Place 5 4  Registration 3 3  Attention/ Calculation 5 5  Recall 2 1  Language- name 2 objects 2 2  Language- repeat 1 1  Language- follow 3 step command 3 3  Language- read & follow direction 1 1  Write a sentence 1 1  Copy design 1 1  Total score 27 24   Cranial nerves: Pupils equal, round, reactive to light.  Extraocular movements intact with no nystagmus. Visual fields full. Facial sensation intact. No facial asymmetry. Tongue, uvula, palate midline.  Motor: Bulk and tone normal, muscle strength 5/5 throughout with no pronator drift.  Sensation to light touch intact.  No extinction to double simultaneous stimulation.  Deep tendon reflexes 2+ throughout, toes downgoing.  Finger to nose testing intact.  Gait narrow-based and steady, able to tandem walk adequately.  Romberg negative.  IMPRESSION: This is a pleasant 77 yo RH woman with no significant vascular risk factors who presented with recurrent episodes of transient vertical diplopia, as well as a "shade of floaters" going down her line of vision. MRI brain did not show any acute changes, carotid dopplers unremarkable. ESR, CRP, myasthenia panel negative. She continues to deny any further episodes of diplopia in the  past year. Memory has been stable, MMSE today 27/30 (24/30 in October 2018). Symptoms suggestive of mild cognitive impairment. We again discussed the option of starting Aricept, she would like to  hold off for now and will follow-up in 6 months. Continue control of vascular risk factors, aspirin for secondary stroke prevention. She will follow-up in 6 months and knows to call for any changes.   Thank you for allowing me to participate in her care.  Please do not hesitate to call for any questions or concerns.  The duration of this appointment visit was 30 minutes of face-to-face time with the patient.  Greater than 50% of this time was spent in counseling, explanation of diagnosis, planning of further management, and coordination of care.   Ellouise Newer, M.D.   CC: Dr. Regis Bill

## 2017-06-24 NOTE — Patient Instructions (Signed)
Great seeing you! Follow-up in 6 months, call for any changes.   RECOMMENDATIONS FOR ALL PATIENTS WITH MEMORY PROBLEMS: 1. Continue to exercise (Recommend 30 minutes of walking everyday, or 3 hours every week) 2. Increase social interactions - continue going to Church and enjoy social gatherings with friends and family 3. Eat healthy, avoid fried foods and eat more fruits and vegetables 4. Maintain adequate blood pressure, blood sugar, and blood cholesterol level. Reducing the risk of stroke and cardiovascular disease also helps promoting better memory. 5. Avoid stressful situations. Live a simple life and avoid aggravations. Organize your time and prepare for the next day in anticipation. 6. Sleep well, avoid any interruptions of sleep and avoid any distractions in the bedroom that may interfere with adequate sleep quality 7. Avoid sugar, avoid sweets as there is a strong link between excessive sugar intake, diabetes, and cognitive impairment The Mediterranean diet has been shown to help patients reduce the risk of progressive memory disorders and reduces cardiovascular risk. This includes eating fish, eat fruits and green leafy vegetables, nuts like almonds and hazelnuts, walnuts, and also use olive oil. Avoid fast foods and fried foods as much as possible. Avoid sweets and sugar as sugar use has been linked to worsening of memory function.   

## 2017-06-30 ENCOUNTER — Encounter: Payer: Self-pay | Admitting: Neurology

## 2017-06-30 NOTE — Telephone Encounter (Signed)
Over due for  Ov for med check   See last    See  notes and then  tramadol is on her list of meds     MAKE OV  FOR MED CHECK  And review   After appt make can rx   15 #  konipen  No refills   To  Hold her over until she can get in

## 2017-07-02 MED ORDER — CLONAZEPAM 0.5 MG PO TABS: 0.5000 mg | ORAL_TABLET | Freq: Every evening | ORAL | 0 refills | Status: DC | PRN

## 2017-07-02 NOTE — Telephone Encounter (Signed)
Rx phoned in . Pt needs OV for med check before any further refills can be sent.  Will call patient to schedule appt 07/05/17

## 2017-07-05 NOTE — Telephone Encounter (Signed)
Left detailed message requesting an OV be scheduled before further refills can be sent.

## 2017-08-06 ENCOUNTER — Ambulatory Visit: Payer: Medicare Other | Admitting: Internal Medicine

## 2017-08-09 DIAGNOSIS — M47816 Spondylosis without myelopathy or radiculopathy, lumbar region: Secondary | ICD-10-CM | POA: Diagnosis not present

## 2017-08-30 ENCOUNTER — Other Ambulatory Visit: Payer: Self-pay | Admitting: Internal Medicine

## 2017-08-30 ENCOUNTER — Ambulatory Visit (INDEPENDENT_AMBULATORY_CARE_PROVIDER_SITE_OTHER): Payer: Medicare Other | Admitting: Internal Medicine

## 2017-08-30 VITALS — BP 125/72 | HR 78 | Temp 98.1°F | Wt 114.0 lb

## 2017-08-30 DIAGNOSIS — M810 Age-related osteoporosis without current pathological fracture: Secondary | ICD-10-CM

## 2017-08-30 DIAGNOSIS — Z79899 Other long term (current) drug therapy: Secondary | ICD-10-CM

## 2017-08-30 DIAGNOSIS — G3184 Mild cognitive impairment, so stated: Secondary | ICD-10-CM

## 2017-08-30 DIAGNOSIS — G4700 Insomnia, unspecified: Secondary | ICD-10-CM

## 2017-08-30 MED ORDER — CLONAZEPAM 0.5 MG PO TABS: 0.5000 mg | ORAL_TABLET | Freq: Every evening | ORAL | 1 refills | Status: DC | PRN

## 2017-08-30 MED ORDER — AMITRIPTYLINE HCL 10 MG PO TABS: 10.0000 mg | ORAL_TABLET | Freq: Every day | ORAL | 1 refills | Status: DC

## 2017-08-30 NOTE — Patient Instructions (Addendum)
I  want you to reschedule with dr Cruzita Lederer and follow up about  The bone health .   And osteoporosis   . You may   Benefit from medication   May need another dexa scan.   The medication  Can be refilled with caution they can be associated with falling and   Memory issues  But seems to help you at this time.   Plan ROV in  6 months   Or as needed

## 2017-08-30 NOTE — Progress Notes (Signed)
No chief complaint on file.   HPI: Jacqueline Burgess 77 y.o. come in for SDA  For medication   management   Needs reill  amutryptiline and  clona pen taking 1/2  At ngiht and  tca 10 mg and repots sleeps very well and wants to continue Has had eval afo momory issues    No other dx .   Was seen for osteroposis in past   But ? wahat fu was  .   Has been seen by ortho   For  ROS: See pertinent positives and negatives per HPI.  Past Medical History:  Diagnosis Date  . Arthritis   . Cataract    lens implant  . Closed pelvic fracture ([place]) 2012   pelvis and shoulder   . Colon polyps   . Hx of fall    fracture pelvis and left shoulder humerus  . Hx of nonmelanoma skin cancer    ssca  . Hx of syncope    had neg event monitor per dr Marya Amsler taylor in 2010  . 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 Kidney stones 1967  . MVP (mitral valve prolapse)    in records  no echo eval in records  . Osteoporosis    eval dr Buddy Duty in past dexa 11 12     Family History  Problem Relation Age of Onset  . CVA Mother        Died in her 62I complications rheumatic  fever afterwards when she got a stroke  . Rheumatic fever Mother   . Lung cancer Sister        Died in 78s was a smoker  . Thyroid cancer Daughter        Papillary    Social History   Socioeconomic History  . Marital status: Married    Spouse name: Not on file  . Number of children: Not on file  . Years of education: Not on file  . Highest education level: Not on file  Occupational History  . Not on file  Social Needs  . Financial resource strain: Not on file  . Food insecurity:    Worry: Not on file    Inability: Not on file  . Transportation needs:    Medical: Not on file    Non-medical: Not on file  Tobacco Use  . Smoking status: Never Smoker  . Smokeless tobacco: Never Used  Substance and Sexual Activity  . Alcohol use: Yes    Alcohol/week: 1.8 oz    Types: 3 Standard drinks or equivalent per week  . Drug  use: No  . Sexual activity: Yes    Partners: Female  Lifestyle  . Physical activity:    Days per week: Not on file    Minutes per session: Not on file  . Stress: Not on file  Relationships  . Social connections:    Talks on phone: Not on file    Gets together: Not on file    Attends religious service: Not on file    Active member of club or organization: Not on file    Attends meetings of clubs or organizations: Not on file    Relationship status: Not on file  Other Topics Concern  . 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   g4 P4   Regular exercise: walks daily   Caffeine use: 2 cups of coffee daily       Outpatient Medications Prior to Visit  Medication Sig Dispense Refill  . aspirin EC 81 MG tablet Take 81 mg by mouth daily.    . Calcium Carbonate-Vitamin D (CALCIUM-VITAMIN D) 500-200 MG-UNIT per tablet Take 1 tablet by mouth 2 (two) times daily with a meal. Reported on 09/27/2015    . glucosamine-chondroitin 500-400 MG tablet Take 1 tablet by mouth daily. Reported on 09/27/2015    . Multiple Vitamins-Minerals (MULTIVITAL) tablet Take 1 tablet by mouth daily. Reported on 09/27/2015    . tretinoin (RETIN-A) 0.025 % cream Reported on 09/27/2015    . amitriptyline (ELAVIL) 10 MG tablet Take 1 tablet (10 mg total) by mouth at bedtime. 90 tablet 2  . clonazePAM (KLONOPIN) 0.5 MG tablet Take 1 tablet (0.5 mg total) by mouth at bedtime as needed. **NEEDS OV FOR FURTHER REFILLS. 15 tablet 0  . betamethasone dipropionate (DIPROLENE) 0.05 % cream Apply 1 application topically 2 (two) times daily. Reported on 09/27/2015    . Doxepin HCl 6 MG TABS Take 1 tablet (6 mg total) by mouth at bedtime as needed. (Patient not taking: Reported on 08/30/2017) 30 tablet 1  . IBUPROFEN PO Take by mouth.  1 OR 2 DAILY PRN    . meloxicam (MOBIC) 7.5 MG tablet TK 1 T PO BID  0  . traMADol (ULTRAM) 50 MG tablet TK 1 T PO Q 6 H  0   No facility-administered medications prior to visit.      EXAM:  BP 125/72   Pulse 78   Temp 98.1 F (36.7 C)   Wt 114 lb (51.7 kg)   BMI 20.19 kg/m   Body mass index is 20.19 kg/m.  GENERAL: vitals reviewed and listed above, alert, oriented, appears well hydrated and in no acute distress HEENT: atraumatic, conjunctiva  clear, no obvious abnormalities on inspection of external nose and earsexudate  NECK: no obvious masses on inspection palpation  LUNGS: clear to auscultation bilaterally, no wheezes, rales or rhonchi, good air movement CV: HRRR, no clubbing cyanosis or  peripheral edema nl cap refill  MS: moves all extremities without noticeable focal  abnormality PSYCH: pleasant and cooperative, no obvious depression or anxiety  BP Readings from Last 3 Encounters:  08/30/17 125/72  06/24/17 122/70  12/24/16 112/64    ASSESSMENT AND PLAN:  Discussed the following assessment and plan:  Insomnia, unspecified type  Osteoporosis, unspecified osteoporosis type, unspecified pathological fracture presence - want her to fu with dr Darnell Level  for som reason  fu yearly did not happen  High risk medication use  Mild cognitive impairment Total visit 13mins > 50% spent counseling and coordinating care as indicated in above note and in instructions to patient .   -Patient advised to return or notify health care team  if  new concerns arise.  Patient Instructions  I  want you to reschedule with dr Cruzita Lederer and follow up about  The bone health .   And osteoporosis   . You may   Benefit from medication   May need another dexa scan.   The medication  Can be refilled with caution they can be associated with falling and   Memory issues  But seems to help you at this time.   Plan ROV in  6 months   Or as needed  Standley Brooking. Panosh M.D.

## 2017-09-01 ENCOUNTER — Encounter: Payer: Self-pay | Admitting: Internal Medicine

## 2017-09-01 NOTE — Telephone Encounter (Signed)
Clonazepam and amitriptyline sent to [facility] on 08/30/17.  Confirmation on both prescriptions.

## 2017-10-20 DIAGNOSIS — M25562 Pain in left knee: Secondary | ICD-10-CM | POA: Diagnosis not present

## 2017-11-17 DIAGNOSIS — Z23 Encounter for immunization: Secondary | ICD-10-CM | POA: Diagnosis not present

## 2017-12-23 DIAGNOSIS — S2231XA Fracture of one rib, right side, initial encounter for closed fracture: Secondary | ICD-10-CM | POA: Diagnosis not present

## 2018-01-07 ENCOUNTER — Ambulatory Visit: Payer: Medicare Other | Admitting: Neurology

## 2018-01-12 ENCOUNTER — Telehealth: Payer: Self-pay | Admitting: Internal Medicine

## 2018-01-12 NOTE — Telephone Encounter (Signed)
Copied from [place] [id number]. Topic: Quick Communication - Rx Refill/Question >> Jan 12, 2018  3:33 PM Cecelia Byars, NT wrote: Medication:clonazePAM [facility]) 0.5 MG tablet  Has the patient contacted their pharmacy? yes  (Agent: If no, request that the patient contact the pharmacy for the refill. (Agent: If yes, when and what did the pharmacy advise? The pharmacy states they need a new prescription for the above medication  Preferred Pharmacy (with phone number or street [place], [place] - [address] [phone] (Phone) [phone] (Fax)    Agent: Please be advised that RX refills may take up to 3 business days. We ask that you follow-up with your pharmacy.

## 2018-01-13 NOTE — Telephone Encounter (Signed)
Please advise Dr Panosh, thanks.   

## 2018-01-19 ENCOUNTER — Other Ambulatory Visit: Payer: Self-pay | Admitting: Internal Medicine

## 2018-01-27 ENCOUNTER — Other Ambulatory Visit: Payer: Self-pay | Admitting: Internal Medicine

## 2018-01-28 NOTE — Telephone Encounter (Signed)
Last refill 08/30/17 at OV  Please advise Dr Regis Bill, thanks.

## 2018-01-31 NOTE — Telephone Encounter (Signed)
Sent in electronically .  Ov before   Further refills

## 2018-02-07 NOTE — Progress Notes (Signed)
Chief Complaint  Patient presents with  . Follow-up    HPI: Jacqueline Burgess 77 y.o. come in for Chronic disease management  Med management  Taking low dose tca and 1/4 clonezepam and "sleeps very well" with out se  Per pat report . Want to continue no falling and feels memory is stable at this time.  husband and daughter recently recovering from surgeries .  osteoporosis :  Has rashes with prolia  And didn't want to take another med at this time  Seed dr Darnell Level  And declined reclast at the time  Wants to continue  With weight bearing and vit d( not currently taking)    Remote hx of fosamax.  Didn't get the fu dexa cause felt not going to go back on medication at this time.  ROS: See pertinent positives and negatives per HPI. No cp  pulm sx no  New neuro sx   Past Medical History:  Diagnosis Date  . Arthritis   . Cataract    lens implant  . Closed pelvic fracture ([place]) 2012   pelvis and shoulder   . Colon polyps   . Hx of fall    fracture pelvis and left shoulder humerus  . Hx of nonmelanoma skin cancer    ssca  . Hx of syncope    had neg event monitor per dr Marya Amsler taylor in 2010  . 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 Kidney stones 1967  . MVP (mitral valve prolapse)    in records  no echo eval in records  . Osteoporosis    eval dr Buddy Duty in past dexa 11 12     Family History  Problem Relation Age of Onset  . CVA Mother        Died in her 87O complications rheumatic  fever afterwards when she got a stroke  . Rheumatic fever Mother   . Lung cancer Sister        Died in 71s was a smoker  . Thyroid cancer Daughter        Papillary    Social History   Socioeconomic History  . Marital status: Married    Spouse name: Not on file  . Number of children: Not on file  . Years of education: Not on file  . Highest education level: Not on file  Occupational History  . Not on file  Social Needs  . Financial resource strain: Not on file  . Food insecurity:   Worry: Not on file    Inability: Not on file  . Transportation needs:    Medical: Not on file    Non-medical: Not on file  Tobacco Use  . Smoking status: Never Smoker  . Smokeless tobacco: Never Used  Substance and Sexual Activity  . Alcohol use: Yes    Alcohol/week: 3.0 standard drinks    Types: 3 Standard drinks or equivalent per week  . Drug use: No  . Sexual activity: Yes    Partners: Female  Lifestyle  . Physical activity:    Days per week: Not on file    Minutes per session: Not on file  . Stress: Not on file  Relationships  . Social connections:    Talks on phone: Not on file    Gets together: Not on file    Attends religious service: Not on file    Active member of club or organization: Not on file    Attends meetings of clubs or  organizations: Not on file    Relationship status: Not on file  Other Topics Concern  . 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   g4 P4   Regular exercise: walks daily   Caffeine use: 2 cups of coffee daily       Allergies as of 02/08/2018      Reactions   Pseudoephedrine Hcl Other (See Comments)   Other Reaction: Other reaction   Sulfa Antibiotics       Medication List        Accurate as of 02/08/18  5:20 PM. Always use your most recent med list.          amitriptyline 10 MG tablet Commonly known as:  ELAVIL TAKE 1 TABLET AT BEDTIME   aspirin EC 81 MG tablet Take 81 mg by mouth daily.   calcium-vitamin D 500-200 MG-UNIT tablet Take 1 tablet by mouth 2 (two) times daily with a meal. Reported on 09/27/2015   clonazePAM 0.5 MG tablet Commonly known as:  KLONOPIN TAKE 1 TABLET AT BEDTIME AS NEEDED (NEED MD APPOINTMENT FOR REFILLS)   MULTIVITAL tablet Take 1 tablet by mouth daily. Reported on 09/27/2015   tretinoin  0.025 % cream Commonly known as:  RETIN-A Reported on 09/27/2015         EXAM:  BP 130/70 (BP Location: Left Arm, Patient Position: Sitting, Cuff Size: Normal)   Pulse 77   Temp 98.3 F (36.8 C) (Oral)   Ht 5\' 3"  (1.6 m)   Wt 116 lb (52.6 kg)   SpO2 97%   BMI 20.55 kg/m   Body mass index is 20.55 kg/m.  GENERAL: vitals reviewed and listed above, alert, oriented, appears well hydrated and in no acute distress HEENT: atraumatic, conjunctiva  clear, no obvious abnormalities on inspection of external nose and ears NECK: no obvious masses on inspection palpation  LUNGS: clear to auscultation bilaterally, no wheezes, rales or rhonchi, good air movement  Scoliosis  CV: HRRR, no clubbing cyanosis or  peripheral edema nl cap refill  MS: moves all extremities without noticeable focal  abnormality PSYCH: pleasant and cooperative, no obvious depression or anxiety  BP Readings from Last 3 Encounters:  02/08/18 130/70  08/30/17 125/72  06/24/17 122/70  record review  ASSESSMENT AND PLAN:  Discussed the following assessment and plan:  Insomnia, unspecified type  Medication management  Osteoporosis, unspecified osteoporosis type, unspecified pathological fracture presence  Mild cognitive impairment - appres stable grossly fucntioning well not driving at night etc  Wears hearing aid  Risk benefit of medication discussed. Will  Continue at this time  Disc   Osteo[orosis and at this time she will "take her chances" But agrees to make sure adds vit D Ok to stop the  glucosamine chondroitin  -Patient advised to return or notify health care team  if  new concerns arise.  Or rov in 6 months   Total visit 41mins > 50% spent counseling and coordinating care as indicated in above note and in instructions to patient .    Patient Instructions  Take  Daily  vit d  3 1000iu -2000 iu  Per day and continue  Weight bearing exercises .    Caution  With  Sleep meds  .  ROV in 6  Months      .      Standley Brooking. Zahrah Sutherlin M.D.

## 2018-02-08 ENCOUNTER — Ambulatory Visit (INDEPENDENT_AMBULATORY_CARE_PROVIDER_SITE_OTHER): Payer: Medicare Other | Admitting: Internal Medicine

## 2018-02-08 ENCOUNTER — Encounter: Payer: Self-pay | Admitting: Internal Medicine

## 2018-02-08 VITALS — BP 130/70 | HR 77 | Temp 98.3°F | Ht 63.0 in | Wt 116.0 lb

## 2018-02-08 DIAGNOSIS — Z79899 Other long term (current) drug therapy: Secondary | ICD-10-CM | POA: Diagnosis not present

## 2018-02-08 DIAGNOSIS — G4700 Insomnia, unspecified: Secondary | ICD-10-CM

## 2018-02-08 DIAGNOSIS — Z974 Presence of external hearing-aid: Secondary | ICD-10-CM

## 2018-02-08 DIAGNOSIS — G3184 Mild cognitive impairment, so stated: Secondary | ICD-10-CM | POA: Diagnosis not present

## 2018-02-08 DIAGNOSIS — M810 Age-related osteoporosis without current pathological fracture: Secondary | ICD-10-CM | POA: Diagnosis not present

## 2018-02-08 MED ORDER — CLONAZEPAM 0.5 MG PO TABS: ORAL_TABLET | ORAL | 0 refills | Status: DC

## 2018-02-08 NOTE — Patient Instructions (Addendum)
Take  Daily  vit d  3 1000iu -2000 iu  Per day and continue  Weight bearing exercises .    Caution  With  Sleep meds  .  ROV in 6  Months    .

## 2018-02-21 ENCOUNTER — Ambulatory Visit: Payer: Medicare Other | Admitting: Internal Medicine

## 2018-02-23 DIAGNOSIS — H04123 Dry eye syndrome of bilateral lacrimal glands: Secondary | ICD-10-CM | POA: Diagnosis not present

## 2018-02-23 DIAGNOSIS — H532 Diplopia: Secondary | ICD-10-CM | POA: Diagnosis not present

## 2018-02-23 DIAGNOSIS — Z961 Presence of intraocular lens: Secondary | ICD-10-CM | POA: Diagnosis not present

## 2018-03-18 DIAGNOSIS — L821 Other seborrheic keratosis: Secondary | ICD-10-CM | POA: Diagnosis not present

## 2018-03-18 DIAGNOSIS — C4442 Squamous cell carcinoma of skin of scalp and neck: Secondary | ICD-10-CM | POA: Diagnosis not present

## 2018-03-18 DIAGNOSIS — D0462 Carcinoma in situ of skin of left upper limb, including shoulder: Secondary | ICD-10-CM | POA: Diagnosis not present

## 2018-03-18 DIAGNOSIS — D485 Neoplasm of uncertain behavior of skin: Secondary | ICD-10-CM | POA: Diagnosis not present

## 2018-03-18 DIAGNOSIS — B360 Pityriasis versicolor: Secondary | ICD-10-CM | POA: Diagnosis not present

## 2018-03-18 DIAGNOSIS — L814 Other melanin hyperpigmentation: Secondary | ICD-10-CM | POA: Diagnosis not present

## 2018-03-18 DIAGNOSIS — Z85828 Personal history of other malignant neoplasm of skin: Secondary | ICD-10-CM | POA: Diagnosis not present

## 2018-03-18 DIAGNOSIS — L570 Actinic keratosis: Secondary | ICD-10-CM | POA: Diagnosis not present

## 2018-03-22 ENCOUNTER — Telehealth: Payer: Self-pay | Admitting: Internal Medicine

## 2018-03-22 MED ORDER — AMITRIPTYLINE HCL 10 MG PO TABS: 10.0000 mg | ORAL_TABLET | Freq: Every day | ORAL | 1 refills | Status: DC

## 2018-03-22 MED ORDER — CLONAZEPAM 0.5 MG PO TABS: ORAL_TABLET | ORAL | 0 refills | Status: DC

## 2018-03-22 NOTE — Telephone Encounter (Signed)
Pt came in the office with spouse and she is in need of 2 refills on Rxs amitriptyline (ELAVIL) and clonazePAM [place] [place])   Pharm:  [facility].

## 2018-03-22 NOTE — Telephone Encounter (Signed)
Sent in electronically . To new pharmacy

## 2018-03-28 ENCOUNTER — Other Ambulatory Visit: Payer: Self-pay | Admitting: Internal Medicine

## 2018-05-16 IMAGING — US US CAROTID DUPLEX BILAT
1 series · 13 of 24 positions shown · non-contrast
Comparison: None.

CLINICAL DATA: Memory loss, visual changes with amaurosis fugax.

EXAM:
BILATERAL CAROTID DUPLEX ULTRASOUND
TECHNIQUE: Gray scale imaging, color Doppler and duplex ultrasound were
performed of bilateral carotid and vertebral arteries in the neck.

[Series 1: us carotid duplex bilat · 0.06mm/px · 13 of 84 slices shown]
[im 1/84]
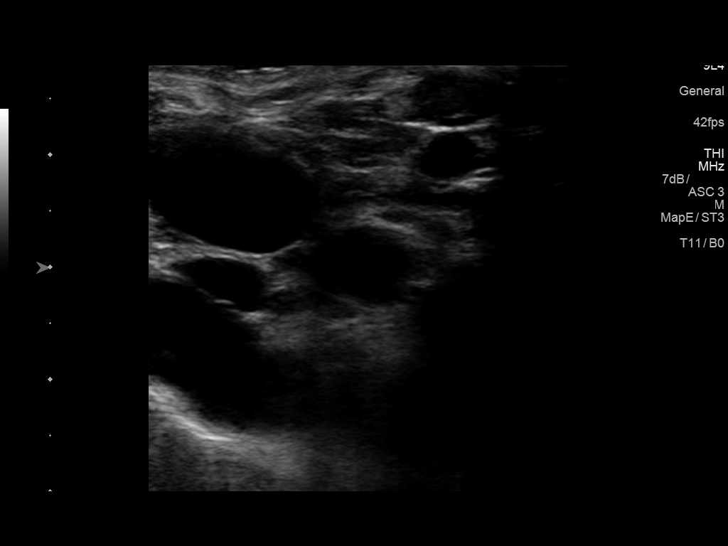
[im 8/84]
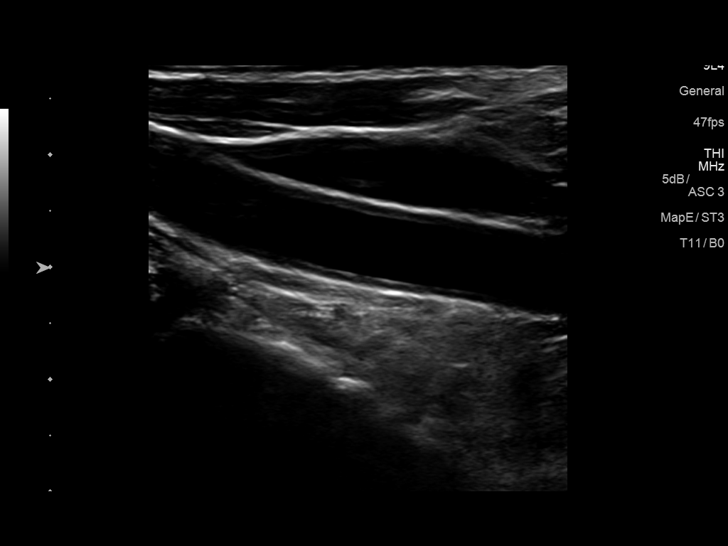
[im 15/84]
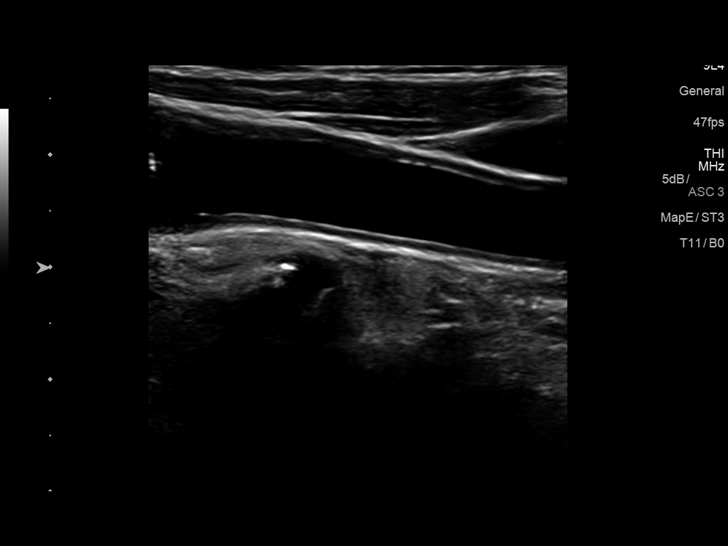
[im 22/84]
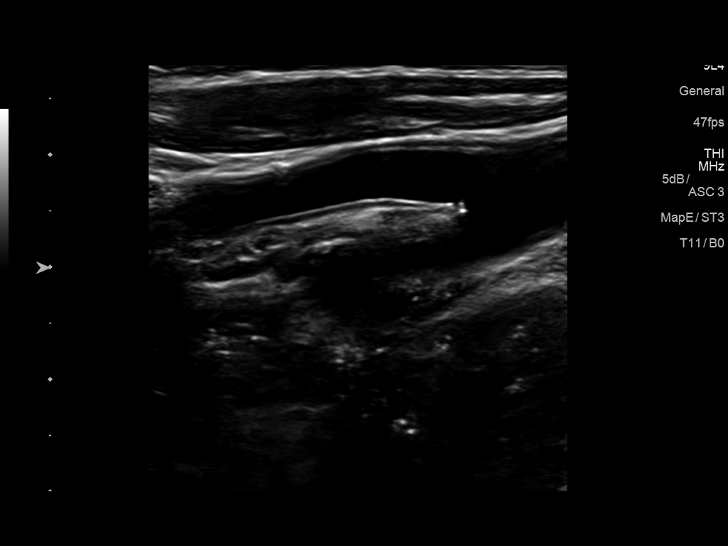
[im 29/84]
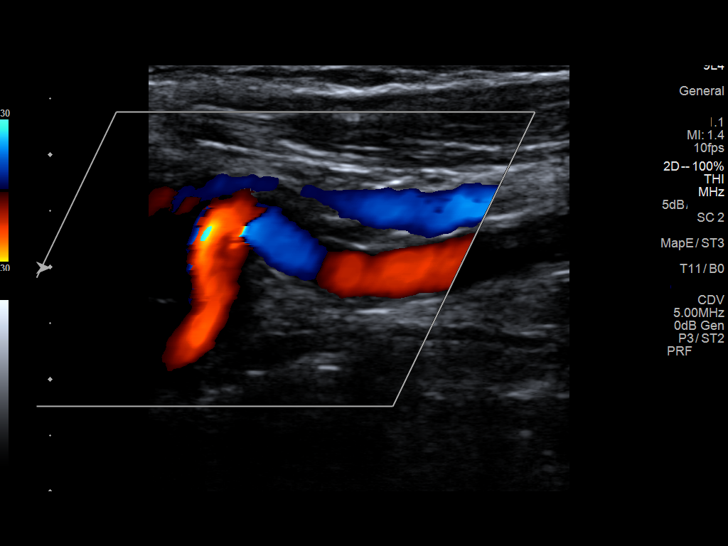
[im 37/84]
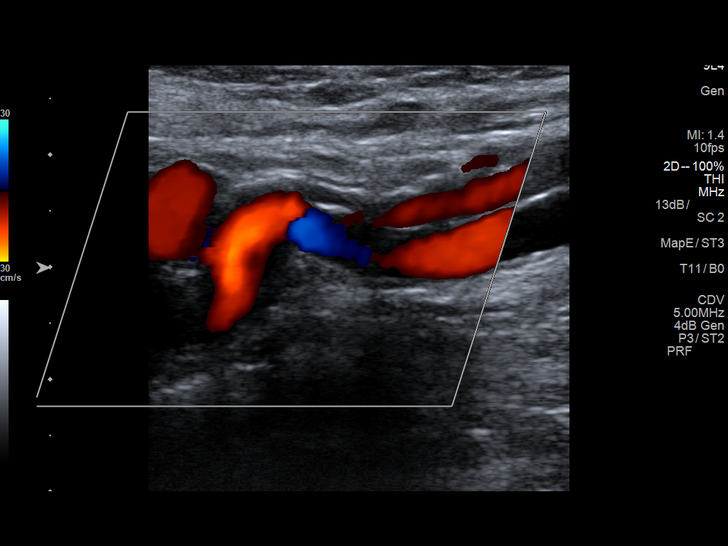
[im 44/84]
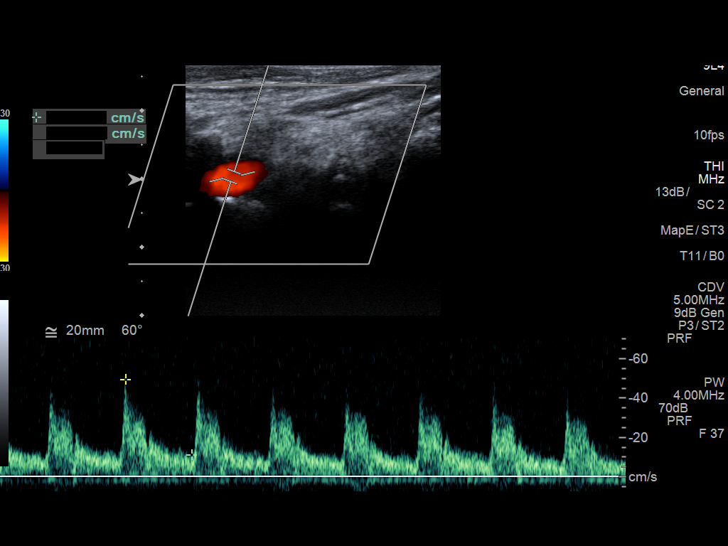
[im 47/84]
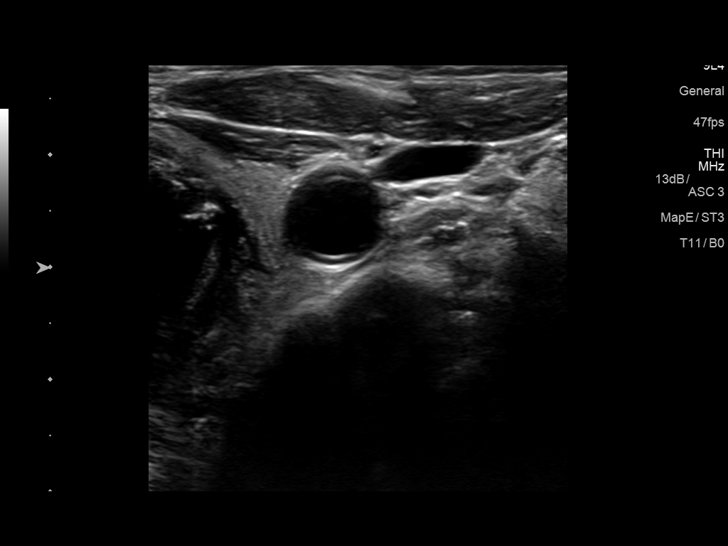
[im 55/84]
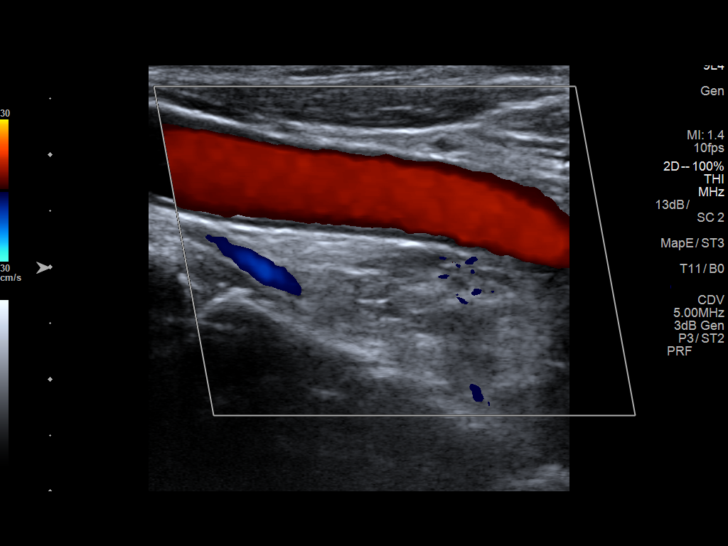
[im 62/84]
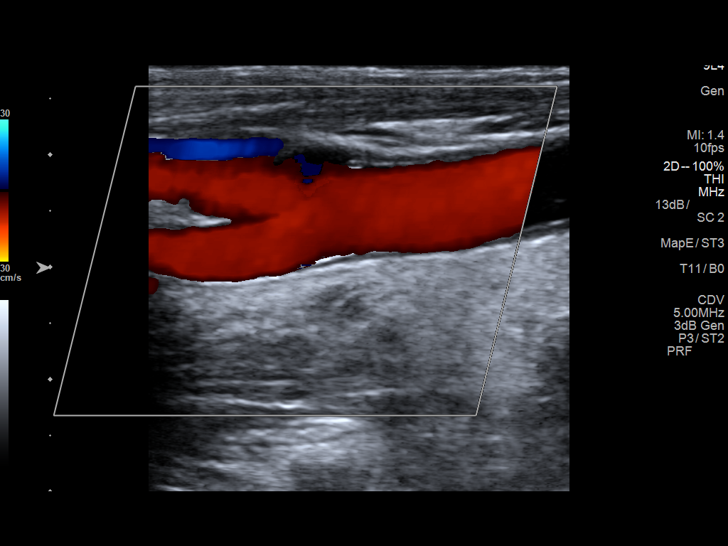
[im 69/84]
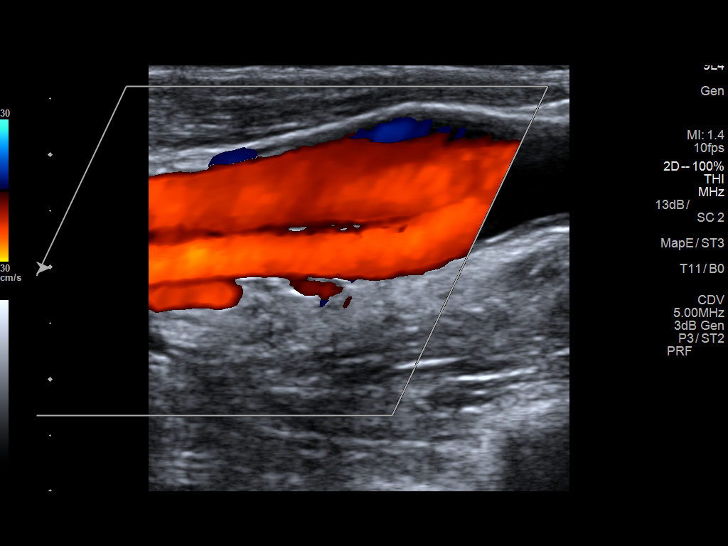
[im 76/84]
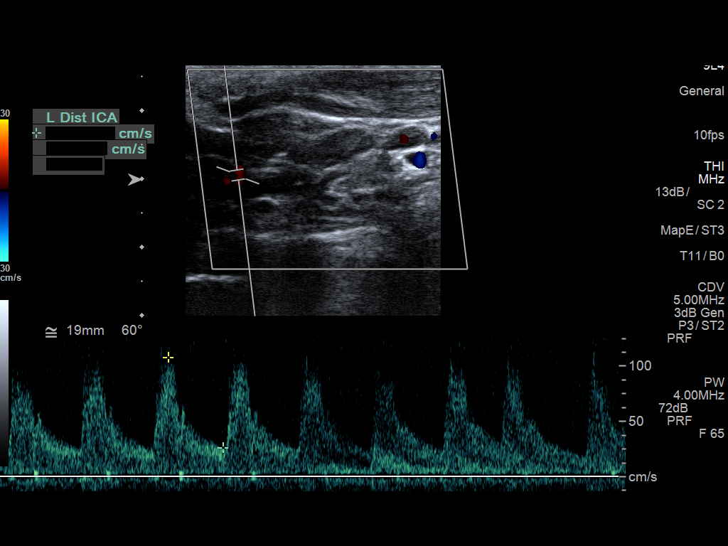
[im 84/84]
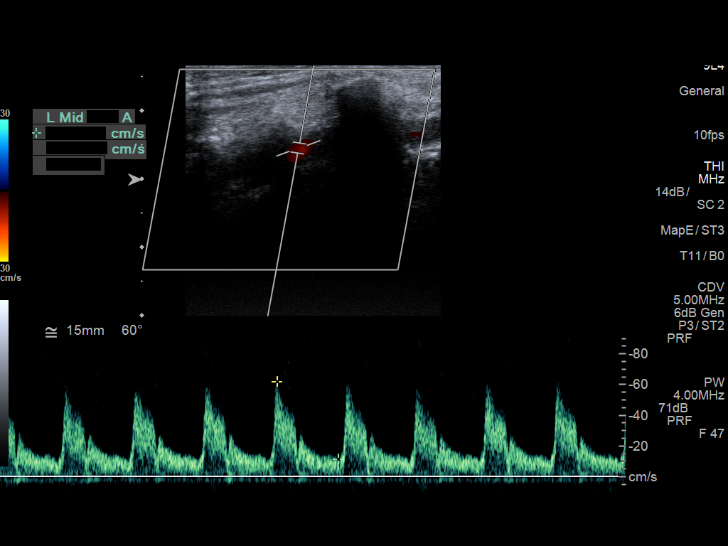

[13 of 24 positions shown; findings below may reference images not displayed]

FINDINGS: Criteria: Quantification of carotid stenosis is based on velocity
parameters that correlate the residual internal carotid diameter
with NASCET-based stenosis levels, using the diameter of the distal
internal carotid lumen as the denominator for stenosis measurement.

The following velocity measurements were obtained:

RIGHT

ICA:  103/24 cm/sec

CCA:  84/12 cm/sec

SYSTOLIC ICA/CCA RATIO:

DIASTOLIC ICA/CCA RATIO:

ECA:  60 cm/sec

LEFT

ICA:  117/29 cm/sec

CCA:  94/26 cm/sec

SYSTOLIC ICA/CCA RATIO:

DIASTOLIC ICA/CCA RATIO:

ECA:  76 cm/sec

RIGHT CAROTID ARTERY: There is diffuse intimal thickening. A minimal
amount of calcified plaque is present in the proximal internal
carotid artery. The ICA is very tortuous in the upper neck.
Estimated right ICA stenosis is less than 50%.

RIGHT VERTEBRAL ARTERY: Antegrade flow with normal waveform and
velocity.

LEFT CAROTID ARTERY: Diffuse intimal thickening present. Minimal
amount of calcified plaque is present at the level of the carotid
bulb and proximal ICA. Estimated left ICA stenosis is less than 50%.

LEFT VERTEBRAL ARTERY: Antegrade flow with normal waveform and
velocity.
IMPRESSION: Intimal thickening with minimal plaque in both proximal internal
carotid arteries. Estimated bilateral ICA stenoses are less than
50%.

## 2018-05-27 ENCOUNTER — Telehealth: Payer: Self-pay

## 2018-05-27 NOTE — Telephone Encounter (Signed)
Author phoned pt. To assess interest in rescheduling AWV to avoid covid exposure, as well as assess interest in doing virtual AWV instead. No answer, unable to leave VM. Will call back on Monday prior to appointment.

## 2018-05-30 ENCOUNTER — Telehealth: Payer: Self-pay

## 2018-05-30 ENCOUNTER — Ambulatory Visit: Payer: Medicare Other

## 2018-05-30 NOTE — Telephone Encounter (Signed)
Author phoned pt. to offer to reschedule awv in light of covid-19 pandemic. Chief Strategy Officer spoke with husband who stated they have a computer, but would not be open to doing virtual visits d/t comfort level with computer use. Husband stated wife would call back to reschedule, and they did not need anything in the meantime.

## 2018-06-22 ENCOUNTER — Telehealth: Payer: Self-pay | Admitting: Internal Medicine

## 2018-06-22 NOTE — Telephone Encounter (Signed)
Patient's son Kyesha Balla called and says that his HR department is requiring him to submit a letter from Dr. Regis Bill on letterhead stating that he is caring for his parents during the covid-19 pandemic. He says they are needing this information to qualify him for the governor's emergency leave act and get paid while he's out of work. He asks for Dr. 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 His CB#[phone].

## 2018-06-22 NOTE — Telephone Encounter (Addendum)
Need more  Specifics   About care   As needed or continuous care ?   Im ok writing a letter  Just not sure how to phrase this .  And what specifics are needed

## 2018-06-23 NOTE — Telephone Encounter (Signed)
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  Stated he needs a note stating he has to care for his mother to take her shopping for food, providing food, driving to appointments or anything in person as he is the sole caregiver during time of COVID 19.  Message sent to Dr Regis Bill.

## 2018-06-23 NOTE — Telephone Encounter (Signed)
Tried calling unable to reach at this time

## 2018-06-29 NOTE — Telephone Encounter (Signed)
Roosvelt Maser, Pt's son called to speak with [facility] and give more information needed by Dr. Regis Bill so she may write letter. [place] was on the phone. Please reach back out to son at [phone]. Parents are unable to answer questions

## 2018-06-30 NOTE — Telephone Encounter (Signed)
Clinic RN spoke with patient son. Per [employer]alee Belts HR needs a note from our office verbalizing d/t COVID 19 pandemic he is taking of parents since they are elderly and high risk. If anything further is needed please call Ronalee Belts [phone].

## 2018-07-01 ENCOUNTER — Encounter: Payer: Self-pay | Admitting: Internal Medicine

## 2018-07-01 NOTE — Telephone Encounter (Signed)
Letter has been printed  ?

## 2018-07-01 NOTE — Telephone Encounter (Signed)
Up front for son to pick up

## 2018-07-14 ENCOUNTER — Other Ambulatory Visit: Payer: Self-pay | Admitting: Internal Medicine

## 2018-07-14 NOTE — Telephone Encounter (Signed)
Please advise 

## 2018-07-15 NOTE — Telephone Encounter (Signed)
Please make  VV or phone viist    Due in June  Sent in electronically .

## 2018-08-26 ENCOUNTER — Encounter: Payer: Self-pay | Admitting: Internal Medicine

## 2018-08-26 ENCOUNTER — Other Ambulatory Visit: Payer: Self-pay

## 2018-08-26 ENCOUNTER — Ambulatory Visit (INDEPENDENT_AMBULATORY_CARE_PROVIDER_SITE_OTHER): Payer: Medicare Other | Admitting: Internal Medicine

## 2018-08-26 DIAGNOSIS — R200 Anesthesia of skin: Secondary | ICD-10-CM | POA: Diagnosis not present

## 2018-08-26 DIAGNOSIS — Z79899 Other long term (current) drug therapy: Secondary | ICD-10-CM

## 2018-08-26 DIAGNOSIS — G4700 Insomnia, unspecified: Secondary | ICD-10-CM

## 2018-08-26 DIAGNOSIS — M810 Age-related osteoporosis without current pathological fracture: Secondary | ICD-10-CM | POA: Diagnosis not present

## 2018-08-26 DIAGNOSIS — H9190 Unspecified hearing loss, unspecified ear: Secondary | ICD-10-CM

## 2018-08-26 NOTE — Progress Notes (Signed)
Virtual Visit via Video Note  I connected with@ on 08/26/18 at  2:15 PM EDT by a video enabled telemedicine application and verified that I am speaking with the correct person using two identifiers. Location patient: home Location provider:work office Persons participating in the virtual visit: patient, provider  [place] national recommendations  regarding COVID 19 pandemic   video visit is advised over in office visit for this patient.  Patient aware  of the limitations of evaluation and management by telemedicine and  availability of in person appointments. and agreed to proceed.   HPI: Jacqueline Burgess presents for video visit  That changed to  Phone visit  Cause of  Volume audio issues   a few days ago had r buttock pain and then radiation to right foot  Numb on top  Since then  Buttock pain and back pain is better but still has numbness on top of foot without weakness or other sx .     Her husband wanted her to call   Also  med check :taking 1/2 clon 10 tc at night and 1/2 of left over methocarbamol  500 for night  Sleep had when  Dr Mardelle Matte gave her for prev fracture.   Wants to know  if she can do her walking   And exercises as she feels walking has helped her back  No trauma weakness or hx of same and no fever  Urinary changes   ROS: See pertinent positives and negatives per HPI. No weakness fever pain at this time   Past Medical History:  Diagnosis Date  . Arthritis   . Cataract    lens implant  . Closed pelvic fracture ([place]) 2012   pelvis and shoulder   . Colon polyps   . Hx of fall    fracture pelvis and left shoulder humerus  . Hx of nonmelanoma skin cancer    ssca  . Hx of syncope    had neg event monitor per dr Marya Amsler taylor in 2010  . 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 Kidney stones 1967  . MVP (mitral valve prolapse)    in records  no echo eval in records  . Osteoporosis    eval dr Buddy Duty in past dexa 11 12     Past Surgical History:  Procedure  Laterality Date  . CATARACT EXTRACTION     left  . CHOLECYSTECTOMY  1999  . [place]  . MOHS SURGERY  2017  . SHOULDER SURGERY  11/2010    Family History  Problem Relation Age of Onset  . CVA Mother        Died in her 09N complications rheumatic  fever afterwards when she got a stroke  . Rheumatic fever Mother   . Lung cancer Sister        Died in 7s was a smoker  . Thyroid cancer Daughter        Papillary    Social History   Tobacco Use  . Smoking status: Never Smoker  . Smokeless tobacco: Never Used  Substance Use Topics  . Alcohol use: Yes    Alcohol/week: 3.0 standard drinks    Types: 3 Standard drinks or equivalent per week  . Drug use: No      Current Outpatient Medications:  .  amitriptyline (ELAVIL) 10 MG tablet, TAKE 1 TABLET AT BEDTIME, Disp: 90 tablet, Rfl: 0 .  aspirin EC 81 MG tablet, Take 81 mg by  mouth daily., Disp: , Rfl:  .  Calcium Carbonate-Vitamin D (CALCIUM-VITAMIN D) 500-200 MG-UNIT per tablet, Take 1 tablet by mouth 2 (two) times daily with a meal. Reported on 09/27/2015, Disp: , Rfl:  .  clonazePAM (KLONOPIN) 0.5 MG tablet, TAKE 1 TABLET BY MOUTH AT BEDTIME AS NEEDED, Disp: 45 tablet, Rfl: 0 .  Multiple Vitamins-Minerals (MULTIVITAL) tablet, Take 1 tablet by mouth daily. Reported on 09/27/2015, Disp: , Rfl:  .  tretinoin (RETIN-A) 0.025 % cream, Reported on 09/27/2015, Disp: , Rfl:   EXAM: BP Readings from Last 3 Encounters:  02/08/18 130/70  08/30/17 125/72  06/24/17 122/70    VITALS per patient if applicable:  GENERAL: alert, oriented, appears well and in no acute distress HEENT: atraumatic, conjunttiva clear, no obvious abnormalities on inspection of external nose and ears hard of hearing  NECK: normal movements of the head and neck LUNGS: on inspection no signs of respiratory distress, breathing rate appears normal, no obvious gross SOB, gasping or wheezing CV: no obvious cyanosis MS: moves all visible extremities  without noticeable abnormality walking  PSYCH/NEURO: pleasant and cooperative, no obvious depression or anxiety, speech and thought processing grossly intact   ASSESSMENT AND PLAN:  Discussed the following assessment and plan:    ICD-10-CM   1. Numbness of right foot  R20.0    prob radiation from back but pain is better over days and no weakness noted   2. Medication management  Z79.899   3. Insomnia, unspecified type  G47.00   4. Decreased hearing, unspecified laterality  H91.90   5. Osteoporosis, unspecified osteoporosis type, unspecified pathological fracture presence  M81.0    Sounds like radiating sx  That are improvement without motor changes   Ok to do gentel walking   If not worse( says it feels better) and same with back exercises  Alarm sx to ed  Otherwise  rov phone or message in 1-2 weeks about how doing   If  persistent or progressive may need to se   Spine ortho person eval  X ray etc  Doesn't sound like osteoporotic process  Counseled.  Medications   On for year  Advised to pick on to stop despite the fact they are very low dosing cause of risk  Plan  rov depending  On how doing and then plan  OV.  In the fall    Expectant management and discussion of plan and treatment with opportunity to ask questions and all were answered. The patient agreed with the plan and demonstrated an understanding of the instructions.   Advised to call back or seek an in-person evaluation if worsening  or having  further concerns .    Shanon Ace, MD

## 2018-09-20 ENCOUNTER — Telehealth: Payer: Self-pay | Admitting: Internal Medicine

## 2018-09-20 NOTE — Telephone Encounter (Signed)
Caller name: Nunzio Cory  Relation to pt: daughter  Call back number:[phone]    Reason for call:  Daughter inquiring when patient had TDAP done due to granddaughter having a baby, please advise

## 2018-09-20 NOTE — Telephone Encounter (Signed)
Pt had td 2016 pt advised she can go to pharmacy and get tdap

## 2018-09-28 ENCOUNTER — Other Ambulatory Visit: Payer: Self-pay | Admitting: Internal Medicine

## 2018-09-28 DIAGNOSIS — Z20822 Contact with and (suspected) exposure to covid-19: Secondary | ICD-10-CM

## 2018-09-28 DIAGNOSIS — R6889 Other general symptoms and signs: Secondary | ICD-10-CM | POA: Diagnosis not present

## 2018-09-30 ENCOUNTER — Other Ambulatory Visit: Payer: Self-pay | Admitting: Internal Medicine

## 2018-09-30 NOTE — Telephone Encounter (Signed)
Last ov:08/26/2018 Last refill:07/15/2018

## 2018-10-02 LAB — SPECIMEN STATUS REPORT

## 2018-10-02 LAB — NOVEL CORONAVIRUS, NAA: SARS-CoV-2, NAA: NOT DETECTED

## 2018-10-03 ENCOUNTER — Telehealth: Payer: Self-pay | Admitting: Internal Medicine

## 2018-10-03 NOTE — Telephone Encounter (Signed)
Patient and her husband called in and received covid test results

## 2018-10-06 ENCOUNTER — Other Ambulatory Visit: Payer: Self-pay

## 2018-11-02 DIAGNOSIS — Z23 Encounter for immunization: Secondary | ICD-10-CM | POA: Diagnosis not present

## 2019-01-13 DIAGNOSIS — H04123 Dry eye syndrome of bilateral lacrimal glands: Secondary | ICD-10-CM | POA: Diagnosis not present

## 2019-01-13 DIAGNOSIS — H532 Diplopia: Secondary | ICD-10-CM | POA: Diagnosis not present

## 2019-01-13 DIAGNOSIS — Z961 Presence of intraocular lens: Secondary | ICD-10-CM | POA: Diagnosis not present

## 2019-01-17 ENCOUNTER — Other Ambulatory Visit: Payer: Self-pay | Admitting: Internal Medicine

## 2019-01-17 NOTE — Telephone Encounter (Signed)
Last ov:08/26/18 Last filled:09/30/18

## 2019-01-23 ENCOUNTER — Other Ambulatory Visit: Payer: Self-pay | Admitting: Internal Medicine

## 2019-01-23 MED ORDER — AMITRIPTYLINE HCL 10 MG PO TABS: 10.0000 mg | ORAL_TABLET | Freq: Every day | ORAL | 0 refills | Status: DC

## 2019-01-23 NOTE — Telephone Encounter (Signed)
Copied from [place] #[id number]. Topic: Quick Communication - Rx Refill/Question >> Jan 23, 2019  1:38 PM Mcneil, Ja-Kwan wrote: Medication: amitriptyline (ELAVIL) 10 MG tablet and clonazePAM (KLONOPIN) 0.5 MG tablet  Has the patient contacted their pharmacy? yes   Preferred Pharmacy (with phone number or street name): [place], [place] - [id number] [phone] (Phone)  [phone] (Fax)  Agent: Please be advised that RX refills may take up to 3 business days. We ask that you follow-up with your pharmacy.

## 2019-02-01 ENCOUNTER — Other Ambulatory Visit: Payer: Self-pay

## 2019-02-10 ENCOUNTER — Encounter: Payer: Self-pay | Admitting: Internal Medicine

## 2019-02-10 ENCOUNTER — Telehealth (INDEPENDENT_AMBULATORY_CARE_PROVIDER_SITE_OTHER): Payer: Medicare Other | Admitting: Internal Medicine

## 2019-02-10 ENCOUNTER — Other Ambulatory Visit: Payer: Self-pay

## 2019-02-10 DIAGNOSIS — G4700 Insomnia, unspecified: Secondary | ICD-10-CM | POA: Diagnosis not present

## 2019-02-10 DIAGNOSIS — M810 Age-related osteoporosis without current pathological fracture: Secondary | ICD-10-CM

## 2019-02-10 DIAGNOSIS — G3184 Mild cognitive impairment, so stated: Secondary | ICD-10-CM | POA: Diagnosis not present

## 2019-02-10 DIAGNOSIS — Z79899 Other long term (current) drug therapy: Secondary | ICD-10-CM | POA: Diagnosis not present

## 2019-02-10 MED ORDER — CLONAZEPAM 0.5 MG PO TABS: 0.5000 mg | ORAL_TABLET | Freq: Every evening | ORAL | 0 refills | Status: DC | PRN

## 2019-02-10 NOTE — Progress Notes (Signed)
Virtual Visit via Video Note  I connected with@ on 02/10/19 at 10:00 AM EST by a video enabled telemedicine application and verified that I am speaking with the correct person using two identifiers. Location patient: home Location provider:work oroffice Persons participating in the virtual visit: patient, provider  [place] national recommendations  regarding COVID 19 pandemic   video visit is advised over in office visit for this patient.  Patient aware  of the limitations of evaluation and management by telemedicine and  availability of in person appointments. and agreed to proceed.   HPI: Jacqueline Burgess presents for video visit Med evaluation  She takes medication every night for sleep that she states works well.  She has been on the clonazepam and amitriptyline for years although the dosage has been reduced.  She takes amitriptyline 10 clonazepam 0.5 and Chip of methocarbamol at night   And when tried to stop.  Did not sleep quite as well.  New line she denies any falling mental fogginess change in her memory status previously evaluated. She has been on this regimen for years and feels he is doing well Regard to osteoporosis she has a history of having taken Fosamax and then Prolia where she had a very bad year of skin rashes eye infections but would not want to restart that. She is taking over-the-counter multivitamin she believes has 5000 units a day and at does walking 3 miles a day no falling feels pretty well.  ROS: See pertinent positives and negatives per HPI. No cp sob falling   Past Medical History:  Diagnosis Date  . Arthritis   . Cataract    lens implant  . Closed pelvic fracture ([place]) 2012   pelvis and shoulder   . Colon polyps   . Hx of fall    fracture pelvis and left shoulder humerus  . Hx of nonmelanoma skin cancer    ssca  . Hx of syncope    had neg event monitor per dr Marya Amsler taylor in 2010  . 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 Kidney stones 1967   . MVP (mitral valve prolapse)    in records  no echo eval in records  . Osteoporosis    eval dr Buddy Duty in past dexa 11 12     Past Surgical History:  Procedure Laterality Date  . CATARACT EXTRACTION     left  . CHOLECYSTECTOMY  1999  . [place]  . MOHS SURGERY  2017  . SHOULDER SURGERY  11/2010    Family History  Problem Relation Age of Onset  . CVA Mother        Died in her [id number] complications rheumatic  fever afterwards when she got a stroke  . Rheumatic fever Mother   . Lung cancer Sister        Died in 57s was a smoker  . Thyroid cancer Daughter        Papillary    Social History   Tobacco Use  . Smoking status: Never Smoker  . Smokeless tobacco: Never Used  Substance Use Topics  . Alcohol use: Yes    Alcohol/week: 3.0 standard drinks    Types: 3 Standard drinks or equivalent per week  . Drug use: No      Current Outpatient Medications:  .  amitriptyline (ELAVIL) 10 MG tablet, Take 1 tablet (10 mg total) by mouth at bedtime., Disp: 90 tablet, Rfl: 0 .  aspirin EC 81 MG  tablet, Take 81 mg by mouth daily., Disp: , Rfl:  .  Calcium Carbonate-Vitamin D (CALCIUM-VITAMIN D) 500-200 MG-UNIT per tablet, Take 1 tablet by mouth 2 (two) times daily with a meal. Reported on 09/27/2015, Disp: , Rfl:  .  clonazePAM (KLONOPIN) 0.5 MG tablet, Take 1 tablet (0.5 mg total) by mouth at bedtime as needed., Disp: 90 tablet, Rfl: 0 .  Multiple Vitamins-Minerals (MULTIVITAL) tablet, Take 1 tablet by mouth daily. Reported on 09/27/2015, Disp: , Rfl:  .  tretinoin (RETIN-A) 0.025 % cream, Reported on 09/27/2015, Disp: , Rfl:   EXAM: BP Readings from Last 3 Encounters:  02/08/18 130/70  08/30/17 125/72  06/24/17 122/70    VITALS per patient if applicable:  GENERAL: alert, oriented, appears well and in no acute distress  HEENT: atraumatic, conjunttiva clear, no obvious abnormalities on inspection of external nose and ears  NECK: normal movements of the head and  neck  LUNGS: on inspection no signs of respiratory distress, breathing rate appears normal, no obvious gross SOB, gasping or wheezing  CV: no obvious cyanosis  PSYCH/NEURO: pleasant and cooperative, no obvious depression or anxiety, speech and thought processing grossly intact Lab Results  Component Value Date   WBC 7.2 05/08/2015   HGB 15.2 (H) 05/08/2015   HCT 45.4 05/08/2015   PLT 178.0 05/08/2015   GLUCOSE 99 05/08/2015   CHOL 211 (H) 07/31/2016   TRIG 83.0 07/31/2016   HDL 68.40 07/31/2016   LDLDIRECT 136.8 03/14/2013   LDLCALC 126 (H) 07/31/2016   ALT 19 02/26/2012   AST 24 02/26/2012   NA 136 05/08/2015   K 4.7 05/08/2015   CL 98 05/08/2015   CREATININE 0.77 05/08/2015   BUN 17 05/08/2015   CO2 28 05/08/2015   TSH 0.83 07/03/2016   INR 0.98 11/11/2010    ASSESSMENT AND PLAN:  Discussed the following assessment and plan:    ICD-10-CM   1. Insomnia, unspecified type  [id number] Basic metabolic panel    CBC with Differential    Vitamin D 25 hydroxy  2. Medication management  [id number] Basic metabolic panel    CBC with Differential    Vitamin D 25 hydroxy  3. Osteoporosis, unspecified osteoporosis type, unspecified pathological fracture presence  [id number] Basic metabolic panel    CBC with Differential    Vitamin D 25 hydroxy  4. High risk medication use  123456 Basic metabolic panel    CBC with Differential    Vitamin D 25 hydroxy  5. Mild cognitive impairment  G31.84    Review of records she did have evaluation in 2014 regard to her osteoporosis feels that she cannot take those medicines that were prescribed pack then at this point will optimize and check vitamin D level at next lab check.  Reviewed medications and risk and perhaps reduce the amitriptyline by half to trial perhaps deprescribing over time. She feels that she is doing quite well on the current situation.  Counseled.   Expectant management and discussion of plan and treatment with opportunity to ask  questions and all were answered. The patient agreed with the plan and demonstrated an understanding of the instructions. If all lab okay our OV or virtual visit in 6 months. Advised to call back or seek an in-person evaluation if worsening  or having  further concerns . Total visit 22mins > 50% spent counseling and coordinating care as indicated in above note and in instructions to patient .  Return in about 6 months (around 08/11/2019) for depending on  labs.  Shanon Ace, MD

## 2019-02-16 ENCOUNTER — Other Ambulatory Visit: Payer: Self-pay

## 2019-02-16 DIAGNOSIS — Z20822 Contact with and (suspected) exposure to covid-19: Secondary | ICD-10-CM

## 2019-02-18 LAB — NOVEL CORONAVIRUS, NAA: SARS-CoV-2, NAA: NOT DETECTED

## 2019-03-30 ENCOUNTER — Ambulatory Visit: Payer: Medicare HMO | Attending: Internal Medicine

## 2019-03-30 DIAGNOSIS — Z23 Encounter for immunization: Secondary | ICD-10-CM

## 2019-03-30 NOTE — Progress Notes (Signed)
   Covid-19 Vaccination Clinic  Name:  LINZE TONTI    MRN: [id number] DOB: 07-01-1940  03/30/2019  Ms. Mcdaris was observed post Covid-19 immunization for 15 minutes without incidence. She was provided with Vaccine Information Sheet and instruction to access the V-Safe system.   Ms. Sila was instructed to call 911 with any severe reactions post vaccine: Marland Kitchen Difficulty breathing  . Swelling of your face and throat  . A fast heartbeat  . A bad rash all over your body  . Dizziness and weakness    Immunizations Administered    Name Date Dose VIS Date Route   Pfizer COVID-19 Vaccine 03/30/2019 12:04 PM 0.3 mL 02/17/2019 Intramuscular   Manufacturer: [place]   Lot: [id number]   [place]: [id number]

## 2019-04-20 ENCOUNTER — Ambulatory Visit: Payer: Medicare HMO | Attending: Internal Medicine

## 2019-04-20 DIAGNOSIS — Z23 Encounter for immunization: Secondary | ICD-10-CM | POA: Insufficient documentation

## 2019-04-20 NOTE — Progress Notes (Signed)
   Covid-19 Vaccination Clinic  Name:  Jacqueline Burgess    MRN: [id number] DOB: 1940/12/02  04/20/2019  [name] was observed post Covid-19 immunization for 15 minutes without incidence. She was provided with Vaccine Information Sheet and instruction to access the V-Safe system.   [name] was instructed to call 911 with any severe reactions post vaccine: Marland Kitchen Difficulty breathing  . Swelling of your face and throat  . A fast heartbeat  . A bad rash all over your body  . Dizziness and weakness    Immunizations Administered    Name Date Dose VIS Date Route   Pfizer COVID-19 Vaccine 04/20/2019 12:11 PM 0.3 mL 02/17/2019 Intramuscular   Manufacturer: [employer], [employer]   Lot: [id number]   [place]: [id number]

## 2019-05-11 ENCOUNTER — Other Ambulatory Visit: Payer: Self-pay | Admitting: Internal Medicine

## 2019-05-18 DIAGNOSIS — D1801 Hemangioma of skin and subcutaneous tissue: Secondary | ICD-10-CM | POA: Diagnosis not present

## 2019-05-18 DIAGNOSIS — L820 Inflamed seborrheic keratosis: Secondary | ICD-10-CM | POA: Diagnosis not present

## 2019-05-18 DIAGNOSIS — D0361 Melanoma in situ of right upper limb, including shoulder: Secondary | ICD-10-CM | POA: Diagnosis not present

## 2019-05-18 DIAGNOSIS — L570 Actinic keratosis: Secondary | ICD-10-CM | POA: Diagnosis not present

## 2019-05-18 DIAGNOSIS — Z85828 Personal history of other malignant neoplasm of skin: Secondary | ICD-10-CM | POA: Diagnosis not present

## 2019-05-18 DIAGNOSIS — C44729 Squamous cell carcinoma of skin of left lower limb, including hip: Secondary | ICD-10-CM | POA: Diagnosis not present

## 2019-05-18 DIAGNOSIS — D485 Neoplasm of uncertain behavior of skin: Secondary | ICD-10-CM | POA: Diagnosis not present

## 2019-05-18 DIAGNOSIS — L821 Other seborrheic keratosis: Secondary | ICD-10-CM | POA: Diagnosis not present

## 2019-05-18 DIAGNOSIS — L603 Nail dystrophy: Secondary | ICD-10-CM | POA: Diagnosis not present

## 2019-05-25 DIAGNOSIS — L821 Other seborrheic keratosis: Secondary | ICD-10-CM | POA: Diagnosis not present

## 2019-05-25 DIAGNOSIS — Z85828 Personal history of other malignant neoplasm of skin: Secondary | ICD-10-CM | POA: Diagnosis not present

## 2019-05-25 DIAGNOSIS — D485 Neoplasm of uncertain behavior of skin: Secondary | ICD-10-CM | POA: Diagnosis not present

## 2019-05-25 DIAGNOSIS — L570 Actinic keratosis: Secondary | ICD-10-CM | POA: Diagnosis not present

## 2019-05-25 DIAGNOSIS — D0361 Melanoma in situ of right upper limb, including shoulder: Secondary | ICD-10-CM | POA: Diagnosis not present

## 2019-06-08 ENCOUNTER — Telehealth: Payer: Medicare HMO | Admitting: Internal Medicine

## 2019-08-09 ENCOUNTER — Other Ambulatory Visit: Payer: Self-pay | Admitting: Internal Medicine

## 2019-08-10 ENCOUNTER — Telehealth: Payer: Self-pay | Admitting: *Deleted

## 2019-08-10 NOTE — Telephone Encounter (Signed)
Called patient and let her know that I have sent in a 30 day refill and she needs a follow up visit with labs for further refills. I have scheduled patient on 08/16/2019 at 2:30pm for an in office visit. Patient verbalized an understanding.

## 2019-08-10 NOTE — Telephone Encounter (Signed)
Patient called after hours line and reports she called 4-5 days from [facility], she needed a rx refill, they said it hasn't come in yet, wants the dr to call it in for a sleep pill, no sx, and has 2 pills left.

## 2019-08-11 ENCOUNTER — Other Ambulatory Visit: Payer: Self-pay | Admitting: Internal Medicine

## 2019-08-16 ENCOUNTER — Ambulatory Visit (INDEPENDENT_AMBULATORY_CARE_PROVIDER_SITE_OTHER): Payer: Medicare HMO | Admitting: Internal Medicine

## 2019-08-16 ENCOUNTER — Encounter: Payer: Self-pay | Admitting: Internal Medicine

## 2019-08-16 ENCOUNTER — Other Ambulatory Visit: Payer: Self-pay

## 2019-08-16 VITALS — BP 102/68 | HR 77 | Temp 98.4°F | Ht 63.0 in | Wt 116.4 lb

## 2019-08-16 DIAGNOSIS — G4700 Insomnia, unspecified: Secondary | ICD-10-CM

## 2019-08-16 DIAGNOSIS — Z79899 Other long term (current) drug therapy: Secondary | ICD-10-CM | POA: Diagnosis not present

## 2019-08-16 MED ORDER — AMITRIPTYLINE HCL 10 MG PO TABS: 10.0000 mg | ORAL_TABLET | Freq: Every day | ORAL | 1 refills | Status: DC

## 2019-08-16 MED ORDER — CLONAZEPAM 0.5 MG PO TABS: 0.2500 mg | ORAL_TABLET | Freq: Every evening | ORAL | 0 refills | Status: DC | PRN

## 2019-08-16 NOTE — Patient Instructions (Addendum)
Continue   Caution with meds refilled .   Plan cpx in about 6 months  Or as needed .

## 2019-08-16 NOTE — Progress Notes (Signed)
Chief Complaint  Patient presents with  . Follow-up    follow up on meds  . Medication Management    HPI: Jacqueline Burgess 79 y.o. come in for Chronic disease management  Refill meds   Insomnia and is still on tca elavil 10 mg at night and  1/2 of clonazepam   And "sleeps like a baby" very well. 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 Hs had covid vaccine  Past Medical History:  Diagnosis Date  . Arthritis   . Cataract    lens implant  . Closed pelvic fracture ([place]) 2012   pelvis and shoulder   . Colon polyps   . Hx of fall    fracture pelvis and left shoulder humerus  . Hx of nonmelanoma skin cancer    ssca  . Hx of syncope    had neg event monitor per dr Marya Amsler taylor in 2010  . 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 Kidney stones 1967  . MVP (mitral valve prolapse)    in records  no echo eval in records  . Osteoporosis    eval dr Buddy Duty in past dexa 11 12     Family History  Problem Relation Age of Onset  . CVA Mother        Died in her 18E complications rheumatic  fever afterwards when she got a stroke  . Rheumatic fever Mother   . Lung cancer Sister        Died in 40s was a smoker  . Thyroid cancer Daughter        Papillary    Social History   Socioeconomic History  . Marital status: Married    Spouse name: Not on file  . Number of children: Not on file  . Years of education: Not on file  . Highest education level: Not on file  Occupational History  . Not on file  Tobacco Use  . Smoking status: Never Smoker  . Smokeless tobacco: Never Used  Substance and Sexual Activity  . Alcohol use: Yes    Alcohol/week: 3.0 standard drinks    Types: 3 Standard drinks or equivalent per week  . Drug use: No  . Sexual activity: Yes    Partners: Female  Other Topics Concern  . Not  on file  Social History Narrative   Married retired Pharmacist, 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   g4 P4   Regular exercise: walks daily   Caffeine use: 2 cups of coffee daily      Social Determinants of Health   Financial Resource Strain:   . Difficulty of Paying Living Expenses:   Food Insecurity:   . Worried About Charity fundraiser in the Last Year:   . Arboriculturist in the Last Year:   Transportation Needs:   . Film/video editor (Medical):   Marland Kitchen Lack of Transportation (Non-Medical):   Physical Activity:   . Days of Exercise per Week:   . Minutes of Exercise per Session:   Stress:   . Feeling of Stress :   Social Connections:   .  Frequency of Communication with Friends and Family:   . Frequency of Social Gatherings with Friends and Family:   . Attends Religious Services:   . Active Member of Clubs or Organizations:   . Attends Archivist Meetings:   Marland Kitchen Marital Status:     Outpatient Medications Prior to Visit  Medication Sig Dispense Refill  . Calcium Carbonate-Vitamin D (CALCIUM-VITAMIN D) 500-200 MG-UNIT per tablet Take 1 tablet by mouth 2 (two) times daily with a meal. Reported on 09/27/2015    . Multiple Vitamins-Minerals (MULTIVITAL) tablet Take 1 tablet by mouth daily. Reported on 09/27/2015    . tretinoin (RETIN-A) 0.025 % cream Reported on 09/27/2015    . amitriptyline (ELAVIL) 10 MG tablet Take 1 tablet (10 mg total) by mouth at bedtime. Schedule follow up with labs for refills. [phone] 30 tablet 0  . aspirin EC 81 MG tablet Take 81 mg by mouth daily.    . clonazePAM (KLONOPIN) 0.5 MG tablet Take 1 tablet (0.5 mg total) by mouth at bedtime as needed. 90 tablet 0  . mupirocin ointment (BACTROBAN) 2 %      No facility-administered medications prior to visit.     EXAM:  BP 102/68   Pulse 77   Temp  98.4 F (36.9 C) (Temporal)   Ht 5\' 3"  (1.6 m)   Wt 116 lb 6.4 oz (52.8 kg)   SpO2 97%   BMI 20.62 kg/m   Body mass index is 20.62 kg/m.  GENERAL: vitals reviewed and listed above, alert, oriented, appears well hydrated and in no acute distress HEENT: atraumatic, conjunctiva  clear, no obvious abnormalities on inspection of external nose and ears OP :masked NECK: no obvious masses on inspection palpation  LUNGS: clear to auscultation bilaterally, no wheezes, rales or rhonchi, good air movement CV: HRRR, no clubbing cyanosis or  peripheral edema nl cap refill  MS: moves all extremities without noticeable focal  Abnormality Skin sun changes   frec kling sun damage  PSYCH: pleasant and cooperative, no obvious depression or anxiety  BP Readings from Last 3 Encounters:  08/16/19 102/68  02/08/18 130/70  08/30/17 125/72    ASSESSMENT AND PLAN:  Discussed the following assessment and plan:  Insomnia, unspecified type  Medication management  High risk medication use Always working on weaning medication  But at this time seems stable with no sig new se  And taking small doses .   Fraser Din again caution about se risks Will refill at this time and cpx in about 6 months  To continue weight bearing exercises  anc vit d calcium -Patient advised to return or notify health care team  if  new concerns arise.  Patient Instructions  Continue   Caution with meds refilled .   Plan cpx in about 6 months  Or as needed .   Standley Brooking. Shekinah Pitones M.D.

## 2019-09-25 ENCOUNTER — Telehealth: Payer: Self-pay | Admitting: Internal Medicine

## 2019-09-25 NOTE — Telephone Encounter (Signed)
Attempted to schedule AWV. Unable to LVM.  Will try at later time.  

## 2019-09-27 ENCOUNTER — Telehealth: Payer: Self-pay | Admitting: *Deleted

## 2019-09-27 NOTE — Telephone Encounter (Signed)
Pt stated her last refill was 05/11/2019 and she received 30 pills which she cuts in half and that lasted her 60 days. She states she did not pick any refill up in June and Pharmacy does not have any on file.   Pt can be reached at [phone]

## 2019-09-27 NOTE — Telephone Encounter (Signed)
Last OV 08/16/2019  Last filled 08/16/2019, #90 with 0 refills  Called and left a detailed message to see how many pills patient received as it looks like it is too soon for a refill per instructions and quantity given.

## 2019-09-27 NOTE — Telephone Encounter (Signed)
Called patient and LMOVM to return call  Called and left a detailed voice message to let patient know that I called the pharmacy and spoke to the pharmacist and they have her refill ready and waiting for her to pick up this prescription that was sent on 08/16/2019.

## 2019-09-27 NOTE — Telephone Encounter (Signed)
Patient called after hours line. Patient reports she needs a a refill on medication Clonazepam 0.5mg . Preferred Pharmacy is [facility] [phone]. Caller states she is completely out of the medication after tonight.

## 2019-11-21 DIAGNOSIS — D1801 Hemangioma of skin and subcutaneous tissue: Secondary | ICD-10-CM | POA: Diagnosis not present

## 2019-11-21 DIAGNOSIS — B360 Pityriasis versicolor: Secondary | ICD-10-CM | POA: Diagnosis not present

## 2019-11-21 DIAGNOSIS — L814 Other melanin hyperpigmentation: Secondary | ICD-10-CM | POA: Diagnosis not present

## 2019-11-21 DIAGNOSIS — C44311 Basal cell carcinoma of skin of nose: Secondary | ICD-10-CM | POA: Diagnosis not present

## 2019-11-21 DIAGNOSIS — L570 Actinic keratosis: Secondary | ICD-10-CM | POA: Diagnosis not present

## 2019-11-21 DIAGNOSIS — L821 Other seborrheic keratosis: Secondary | ICD-10-CM | POA: Diagnosis not present

## 2019-11-21 DIAGNOSIS — Z85820 Personal history of malignant melanoma of skin: Secondary | ICD-10-CM | POA: Diagnosis not present

## 2019-11-21 DIAGNOSIS — D485 Neoplasm of uncertain behavior of skin: Secondary | ICD-10-CM | POA: Diagnosis not present

## 2019-11-21 DIAGNOSIS — Z85828 Personal history of other malignant neoplasm of skin: Secondary | ICD-10-CM | POA: Diagnosis not present

## 2019-12-23 ENCOUNTER — Ambulatory Visit: Payer: Self-pay

## 2019-12-23 ENCOUNTER — Other Ambulatory Visit: Payer: Self-pay

## 2019-12-23 ENCOUNTER — Ambulatory Visit: Payer: Medicare HMO | Attending: Internal Medicine

## 2019-12-23 DIAGNOSIS — Z23 Encounter for immunization: Secondary | ICD-10-CM

## 2019-12-23 NOTE — Progress Notes (Signed)
   Covid-19 Vaccination Clinic  Name:  Jacqueline Burgess    MRN: [id number] DOB: April 09, 1940  12/23/2019  [name] was observed post Covid-19 immunization for 15 minutes without incident. She was provided with Vaccine Information Sheet and instruction to access the V-Safe system.   [name] was instructed to call 911 with any severe reactions post vaccine: Marland Kitchen Difficulty breathing  . Swelling of face and throat  . A fast heartbeat  . A bad rash all over body  . Dizziness and weakness

## 2020-02-27 ENCOUNTER — Other Ambulatory Visit: Payer: Self-pay | Admitting: Internal Medicine

## 2020-03-01 NOTE — Telephone Encounter (Signed)
Arrange  yearly physical or med check appointment in January before next refill is due.

## 2020-03-03 ENCOUNTER — Other Ambulatory Visit: Payer: Self-pay | Admitting: Internal Medicine

## 2020-03-04 ENCOUNTER — Telehealth: Payer: Self-pay | Admitting: Internal Medicine

## 2020-03-04 MED ORDER — AMITRIPTYLINE HCL 10 MG PO TABS: 10.0000 mg | ORAL_TABLET | Freq: Every day | ORAL | 1 refills | Status: DC

## 2020-03-04 NOTE — Telephone Encounter (Signed)
pt is requseting  a refill  clonazePAM (KLONOPIN) 0.5 MG tablet amitriptyline (ELAVIL) 10 MG tablet  [facility] [address], [place] - [address]  Phone:  [phone] Fax:  [phone]

## 2020-03-04 NOTE — Telephone Encounter (Signed)
Rx's have been sent in, Klonopin rx was sent in 12/24.

## 2020-05-20 DIAGNOSIS — J00 Acute nasopharyngitis [common cold]: Secondary | ICD-10-CM | POA: Diagnosis not present

## 2020-05-20 DIAGNOSIS — Z20822 Contact with and (suspected) exposure to covid-19: Secondary | ICD-10-CM | POA: Diagnosis not present

## 2020-05-20 DIAGNOSIS — B349 Viral infection, unspecified: Secondary | ICD-10-CM | POA: Diagnosis not present

## 2020-05-22 ENCOUNTER — Telehealth: Payer: Self-pay | Admitting: Internal Medicine

## 2020-05-22 NOTE — Telephone Encounter (Signed)
Pt is calling in needing a refill due to her being out Rx amitriptyline (ELAVIL) 10 MG and clonazepam (KLONOPIN) 0.5 MG  Pharm:  [facility] on [address].

## 2020-05-22 NOTE — Telephone Encounter (Signed)
Pt is calling in to check the status of the below msg and would like to know if she can get a call once it has been called in.

## 2020-05-22 NOTE — Telephone Encounter (Signed)
Last office visit- 08-16-2019 Last refill- 03/01/2020  Called patient left voicemail to call for follow up

## 2020-05-23 ENCOUNTER — Other Ambulatory Visit: Payer: Self-pay

## 2020-05-23 ENCOUNTER — Encounter: Payer: Self-pay | Admitting: Internal Medicine

## 2020-05-23 ENCOUNTER — Telehealth (INDEPENDENT_AMBULATORY_CARE_PROVIDER_SITE_OTHER): Payer: Medicare HMO | Admitting: Internal Medicine

## 2020-05-23 DIAGNOSIS — Z79899 Other long term (current) drug therapy: Secondary | ICD-10-CM | POA: Diagnosis not present

## 2020-05-23 DIAGNOSIS — G3184 Mild cognitive impairment, so stated: Secondary | ICD-10-CM | POA: Diagnosis not present

## 2020-05-23 DIAGNOSIS — H579 Unspecified disorder of eye and adnexa: Secondary | ICD-10-CM

## 2020-05-23 DIAGNOSIS — Z974 Presence of external hearing-aid: Secondary | ICD-10-CM

## 2020-05-23 DIAGNOSIS — G4700 Insomnia, unspecified: Secondary | ICD-10-CM | POA: Diagnosis not present

## 2020-05-23 MED ORDER — CLONAZEPAM 0.5 MG PO TABS: ORAL_TABLET | ORAL | 0 refills | Status: DC

## 2020-05-23 MED ORDER — AMITRIPTYLINE HCL 10 MG PO TABS: 10.0000 mg | ORAL_TABLET | Freq: Every day | ORAL | 1 refills | Status: DC

## 2020-05-23 NOTE — Progress Notes (Signed)
Virtual Visit via Telephone Note  I connected with@ on 05/23/20 at  9:30 AM EDT by telephone and verified that I am speaking with the correct person using two identifiers.   I discussed the limitations, risks, security and privacy concerns of performing an evaluation and management service by telephone and the limited availability of in person appointments. tThere may be a patient responsible charge related to this service. The patient expressed understanding and agreed to proceed.  Location patient: home Location provider: home office Participants present for the call: patient, provider Patient did not have a visit in the prior 7 days to address this/these issue(s).   History of Present Illness: Jacqueline Burgess requests appointment for medication evaluation and refill.  She has been on clonazepam and    for many years for sleep and states that it works very well for her and she denies any fogginess the next day other in the remote past has been some question about cognitive decline. In the system it appeared she had an urgent care visit on March 14 in regard to her burning eyes that she states she has had for quite a while and it comes and goes She does not remember an urgent care visit per se but did state that she went to an appointment with her husband and complained of eye problems and then may be was evaluated. Takes it about an hour before bedtime 10 mg amitriptyline and a half of a 0.5 clonazepam at 830 bed at 930 up at 730 awake denies any fogginess falling and she does not think there is a change in mental memory. Walks every day while saying the rosary.  She has had this problem for a while has seen Dr. Katy Fitch  in the past but is to soak her eyes warm washcloths etc. to avoid the problem.  Not interested in going back the evaluation stated that she was negative for Covid and no other advice except supportive.    Observations/Objective: Patient sounds personable and well on  the phone. I do not appreciate any SOB. Speech and thought processing are grossly intact.  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 It is a bit concerning that her memory of the Monday visit was not in sync with what was written.  But normal conversation insight today. Follow Up Instructions:  Summer  In person visit  Or as needed   [id number] [id number] [id number] 11-20 [id number] 21-30 I did not refer this patient for an OV in the next 24 hours for this/these issue(s).  I discussed the assessment and treatment plan with the patient. The patient was provided an opportunity to ask questions and answered. The patient agreed with the plan and demonstrated an understanding of the instructions.   The patient was advised to call back or seek an in-person evaluation if the symptoms  worsen or if the condition fails to improve as anticipated.  I provided  23minutes of non-face-to-face time during this encounter. Return for in person in june or July or as needed .  Shanon Ace, MD

## 2020-05-28 ENCOUNTER — Other Ambulatory Visit: Payer: Self-pay

## 2020-05-28 ENCOUNTER — Ambulatory Visit (INDEPENDENT_AMBULATORY_CARE_PROVIDER_SITE_OTHER): Payer: Medicare HMO

## 2020-05-28 ENCOUNTER — Telehealth: Payer: Self-pay | Admitting: Internal Medicine

## 2020-05-28 VITALS — BP 110/78 | HR 100 | Temp 97.9°F | Wt 122.3 lb

## 2020-05-28 DIAGNOSIS — Z0000 Encounter for general adult medical examination without abnormal findings: Secondary | ICD-10-CM | POA: Diagnosis not present

## 2020-05-28 NOTE — Patient Instructions (Addendum)
Jacqueline Burgess , Thank you for taking time to come for your Medicare Wellness Visit. I appreciate your ongoing commitment to your health goals. Please review the following plan we discussed and let me know if I can assist you in the future.   Screening recommendations/referrals: Colonoscopy: No longer required Mammogram: No Longer required Bone Density: Done 02/09/13 Recommended yearly ophthalmology/optometry visit for glaucoma screening and checkup Recommended yearly dental visit for hygiene and checkup  Vaccinations: Influenza vaccine: pt stated completed  Pneumococcal vaccine: Up to date Tdap vaccine: Up to date Shingles vaccine: Shingrix discussed. Please contact your pharmacy for coverage information.    Covid-19:Completed 1/21, 2/11, 12/23/19  Advanced directives: Please bring a copy of your health care power of attorney and living will to the office at your convenience.  Conditions/risks identified: Continue walking and praying  Next appointment: Follow up in one year for your annual wellness visit    Preventive Care 65 Years and Older, Female Preventive care refers to lifestyle choices and visits with your health care provider that can promote health and wellness. What does preventive care include?  A yearly physical exam. This is also called an annual well check.  Dental exams once or twice a year.  Routine eye exams. Ask your health care provider how often you should have your eyes checked.  Personal lifestyle choices, including:  Daily care of your teeth and gums.  Regular physical activity.  Eating a healthy diet.  Avoiding tobacco and drug use.  Limiting alcohol use.  Practicing safe sex.  Taking low-dose aspirin every day.  Taking vitamin and mineral supplements as recommended by your health care provider. What happens during an annual well check? The services and screenings done by your health care provider during your annual well check will depend on  your age, overall health, lifestyle risk factors, and family history of disease. Counseling  Your health care provider may ask you questions about your:  Alcohol use.  Tobacco use.  Drug use.  Emotional well-being.  Home and relationship well-being.  Sexual activity.  Eating habits.  History of falls.  Memory and ability to understand (cognition).  Work and work Statistician.  Reproductive health. Screening  You may have the following tests or measurements:  Height, weight, and BMI.  Blood pressure.  Lipid and cholesterol levels. These may be checked every 5 years, or more frequently if you are over 32 years old.  Skin check.  Lung cancer screening. You may have this screening every year starting at age 27 if you have a 30-pack-year history of smoking and currently smoke or have quit within the past 15 years.  Fecal occult blood test (FOBT) of the stool. You may have this test every year starting at age 70.  Flexible sigmoidoscopy or colonoscopy. You may have a sigmoidoscopy every 5 years or a colonoscopy every 10 years starting at age 88.  Hepatitis C blood test.  Hepatitis B blood test.  Sexually transmitted disease (STD) testing.  Diabetes screening. This is done by checking your blood sugar (glucose) after you have not eaten for a while (fasting). You may have this done every 1-3 years.  Bone density scan. This is done to screen for osteoporosis. You may have this done starting at age 64.  Mammogram. This may be done every 1-2 years. Talk to your health care provider about how often you should have regular mammograms. Talk with your health care provider about your test results, treatment options, and if necessary, the need for  more tests. Vaccines  Your health care provider may recommend certain vaccines, such as:  Influenza vaccine. This is recommended every year.  Tetanus, diphtheria, and acellular pertussis (Tdap, Td) vaccine. You may need a Td booster  every 10 years.  Zoster vaccine. You may need this after age 46.  Pneumococcal 13-valent conjugate (PCV13) vaccine. One dose is recommended after age 2.  Pneumococcal polysaccharide (PPSV23) vaccine. One dose is recommended after age 62. Talk to your health care provider about which screenings and vaccines you need and how often you need them. This information is not intended to replace advice given to you by your health care provider. Make sure you discuss any questions you have with your health care provider. Document Released: 03/22/2015 Document Revised: 11/13/2015 Document Reviewed: 12/25/2014 Elsevier Interactive Patient Education  2017 [employer] Prevention in the Home Falls can cause injuries. They can happen to people of all ages. There are many things you can do to make your home safe and to help prevent falls. What can I do on the outside of my home?  Regularly fix the edges of walkways and driveways and fix any cracks.  Remove anything that might make you trip as you walk through a door, such as a raised step or threshold.  Trim any bushes or trees on the path to your home.  Use bright outdoor lighting.  Clear any walking paths of anything that might make someone trip, such as rocks or tools.  Regularly check to see if handrails are loose or broken. Make sure that both sides of any steps have handrails.  Any raised decks and porches should have guardrails on the edges.  Have any leaves, snow, or ice cleared regularly.  Use sand or salt on walking paths during winter.  Clean up any spills in your garage right away. This includes oil or grease spills. What can I do in the bathroom?  Use night lights.  Install grab bars by the toilet and in the tub and shower. Do not use towel bars as grab bars.  Use non-skid mats or decals in the tub or shower.  If you need to sit down in the shower, use a plastic, non-slip stool.  Keep the floor dry. Clean up any  water that spills on the floor as soon as it happens.  Remove soap buildup in the tub or shower regularly.  Attach bath mats securely with double-sided non-slip rug tape.  Do not have throw rugs and other things on the floor that can make you trip. What can I do in the bedroom?  Use night lights.  Make sure that you have a light by your bed that is easy to reach.  Do not use any sheets or blankets that are too big for your bed. They should not hang down onto the floor.  Have a firm chair that has side arms. You can use this for support while you get dressed.  Do not have throw rugs and other things on the floor that can make you trip. What can I do in the kitchen?  Clean up any spills right away.  Avoid walking on wet floors.  Keep items that you use a lot in easy-to-reach places.  If you need to reach something above you, use a strong step stool that has a grab bar.  Keep electrical cords out of the way.  Do not use floor polish or wax that makes floors slippery. If you must use wax, use non-skid  floor wax.  Do not have throw rugs and other things on the floor that can make you trip. What can I do with my stairs?  Do not leave any items on the stairs.  Make sure that there are handrails on both sides of the stairs and use them. Fix handrails that are broken or loose. Make sure that handrails are as long as the stairways.  Check any carpeting to make sure that it is firmly attached to the stairs. Fix any carpet that is loose or worn.  Avoid having throw rugs at the top or bottom of the stairs. If you do have throw rugs, attach them to the floor with carpet tape.  Make sure that you have a light switch at the top of the stairs and the bottom of the stairs. If you do not have them, ask someone to add them for you. What else can I do to help prevent falls?  Wear shoes that:  Do not have high heels.  Have rubber bottoms.  Are comfortable and fit you well.  Are closed  at the toe. Do not wear sandals.  If you use a stepladder:  Make sure that it is fully opened. Do not climb a closed stepladder.  Make sure that both sides of the stepladder are locked into place.  Ask someone to hold it for you, if possible.  Clearly mark and make sure that you can see:  Any grab bars or handrails.  First and last steps.  Where the edge of each step is.  Use tools that help you move around (mobility aids) if they are needed. These include:  Canes.  Walkers.  Scooters.  Crutches.  Turn on the lights when you go into a dark area. Replace any light bulbs as soon as they burn out.  Set up your furniture so you have a clear path. Avoid moving your furniture around.  If any of your floors are uneven, fix them.  If there are any pets around you, be aware of where they are.  Review your medicines with your doctor. Some medicines can make you feel dizzy. This can increase your chance of falling. Ask your doctor what other things that you can do to help prevent falls. This information is not intended to replace advice given to you by your health care provider. Make sure you discuss any questions you have with your health care provider. Document Released: 12/20/2008 Document Revised: 08/01/2015 Document Reviewed: 03/30/2014 Elsevier Interactive Patient Education  2017 [employer].

## 2020-05-28 NOTE — Telephone Encounter (Signed)
Patient is requesting a refill on Clonazepam and Amitriptyline.  Pharmacy: [facility]  Please advise.

## 2020-05-28 NOTE — Progress Notes (Addendum)
Subjective:   Jacqueline Burgess is a 80 y.o. female who presents for Medicare Annual (Subsequent) preventive examination.  Review of Systems     Cardiac Risk Factors include: advanced age (>30men, >68 women);dyslipidemia     Objective:    Today's Vitals   05/28/20 1446  BP: 110/78  Pulse: 100  Temp: 97.9 F (36.6 C)  SpO2: 95%  Weight: 122 lb 4.8 oz (55.5 kg)   Body mass index is 21.66 kg/m.  Advanced Directives 05/28/2020  Does Patient Have a Medical Advance Directive? Yes  Type of Paramedic of [place];Living will  Copy of [place] in Chart? No - copy requested    Current Medications (verified) Outpatient Encounter Medications as of 05/28/2020  Medication Sig  . Calcium Carbonate-Vitamin D (CALCIUM-VITAMIN D) 500-200 MG-UNIT per tablet Take 1 tablet by mouth 2 (two) times daily with a meal. Reported on 09/27/2015  . methocarbamol (ROBAXIN) 500 MG tablet   . Multiple Vitamins-Minerals (MULTIVITAL) tablet Take 1 tablet by mouth daily. Reported on 09/27/2015  . [DISCONTINUED] amitriptyline (ELAVIL) 10 MG tablet Take 1 tablet (10 mg total) by mouth at bedtime.  . [DISCONTINUED] clonazePAM (KLONOPIN) 0.5 MG tablet TAKE 1/2 (ONE-HALF) TABLET BY MOUTH AT BEDTIME AS NEEDED  . FLUZONE HIGH-DOSE QUADRIVALENT 0.7 ML SUSY   . mupirocin ointment (BACTROBAN) 2 %  (Patient not taking: Reported on 05/28/2020)  . tretinoin (RETIN-A) 0.025 % cream Reported on 09/27/2015 (Patient not taking: Reported on 05/28/2020)   No facility-administered encounter medications on file as of 05/28/2020.    Allergies (verified) Pseudoephedrine hcl and Sulfa antibiotics   History: Past Medical History:  Diagnosis Date  . Arthritis   . Cataract    lens implant  . Closed pelvic fracture ([place]) 2012   pelvis and shoulder   . Colon polyps   . Hx of fall    fracture pelvis and left shoulder humerus  . Hx of nonmelanoma skin cancer    ssca  . Hx of syncope     had neg event monitor per dr Marya Amsler taylor in 2010  . 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 Kidney stones 1967  . MVP (mitral valve prolapse)    in records  no echo eval in records  . Osteoporosis    eval dr Buddy Duty in past dexa 11 12    Past Surgical History:  Procedure Laterality Date  . CATARACT EXTRACTION     left  . CHOLECYSTECTOMY  1999  . [place]  . MOHS SURGERY  2017  . SHOULDER SURGERY  11/2010   Family History  Problem Relation Age of Onset  . CVA Mother        Died in her 59D complications rheumatic  fever afterwards when she got a stroke  . Rheumatic fever Mother   . Lung cancer Sister        Died in 70s was a smoker  . Thyroid cancer Daughter        Papillary   Social History   Socioeconomic History  . Marital status: Married    Spouse name: Not on file  . Number of children: Not on file  . Years of education: Not on file  . Highest education level: Not on file  Occupational History  . Not on file  Tobacco Use  . Smoking status: Never Smoker  . Smokeless tobacco: Never Used  Vaping Use  . Vaping Use: Never used  Substance and Sexual Activity  . Alcohol use: Yes    Alcohol/week: 3.0 standard drinks    Types: 3 Standard drinks or equivalent per week  . Drug use: No  . Sexual activity: Yes    Partners: Female  Other Topics Concern  . 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   g4 P4   Regular exercise: walks daily   Caffeine use: 2 cups of coffee daily      Social Determinants of Health   Financial Resource Strain: Low Risk   . Difficulty of Paying Living Expenses: Not hard at all  Food Insecurity: No Food Insecurity  . Worried About Charity fundraiser in the Last Year: Never true  . Ran Out of Food in the Last Year:  Never true  Transportation Needs: No Transportation Needs  . Lack of Transportation (Medical): No  . Lack of Transportation (Non-Medical): No  Physical Activity: Sufficiently Active  . Days of Exercise per Week: 7 days  . Minutes of Exercise per Session: 60 min  Stress: No Stress Concern Present  . Feeling of Stress : Not at all  Social Connections: Socially Integrated  . Frequency of Communication with Friends and Family: Once a week  . Frequency of Social Gatherings with Friends and Family: Three times a week  . Attends Religious Services: More than 4 times per year  . Active Member of Clubs or Organizations: Yes  . Attends Archivist Meetings: 1 to 4 times per year  . Marital Status: Married    Tobacco Counseling Counseling given: Not Answered   Clinical Intake:  Pre-visit preparation completed: Yes  Pain : No/denies pain     BMI - recorded: 21.66 Nutritional Status: BMI of 19-24  Normal Nutritional Risks: None Diabetes: No  How often do you need to have someone help you when you read instructions, pamphlets, or other written materials from your doctor or pharmacy?: 1 - Never  Diabetic?No  Interpreter Needed?: No  Information entered by :: Charlott Rakes, LPN   Activities of Daily Living In your present state of health, do you have any difficulty performing the following activities: 05/28/2020  Hearing? Y  Vision? N  Difficulty concentrating or making decisions? N  Walking or climbing stairs? N  Dressing or bathing? N  Doing errands, shopping? N  Preparing Food and eating ? N  Using the Toilet? N  In the past six months, have you accidently leaked urine? N  Do you have problems with loss of bowel control? N  Managing your Medications? N  Managing your Finances? N  Housekeeping or managing your Housekeeping? N  Some recent data might be hidden    Patient Care Team: Panosh, Standley Brooking, MD as PCP - General (Internal Medicine) Paula Compton, MD  as Attending Physician (Obstetrics and Gynecology) Marchia Bond, MD as Attending Physician (Orthopedic Surgery) Clent Jacks, MD (Ophthalmology) Rolm Bookbinder, MD as Attending Physician (Dermatology) Irene Shipper, MD as Attending Physician (Gastroenterology)  Indicate any recent Medical Services you may have received from other than [facility] providers in the past year (date may be approximate).     Assessment:   This is a routine wellness examination for Shayda.  Hearing/Vision screen  Hearing Screening   125Hz  250Hz  500Hz  1000Hz  2000Hz   3000Hz  4000Hz  6000Hz  8000Hz   Right ear:           Left ear:           Comments: Pt states hearing loss  Vision Screening Comments: Pt follows up with Dr Katy Fitch for annual eye exams   Dietary issues and exercise activities discussed: Current Exercise Habits: Home exercise routine, Type of exercise: walking, Time (Minutes): 60, Frequency (Times/Week): 7, Weekly Exercise (Minutes/Week): 420  Goals    . Patient Stated     Continue walking and praying      Depression Screen PHQ 2/9 Scores 05/28/2020 11/10/2016 05/08/2015 03/13/2014 03/14/2013  PHQ - 2 Score 0 0 0 0 0    Fall Risk Fall Risk  05/28/2020 02/01/2019 10/06/2018 06/24/2017 12/24/2016  Falls in the past year? 0 0 (No Data) No No  Comment - Emmi Telephone Survey: data to providers prior to load Emmi Telephone Survey: data to providers prior to load - -  Number falls in past yr: 0 - (No Data) - -  Comment - - Emmi Telephone Survey Actual Response =  - -  Injury with Fall? 0 - - - -  Risk for fall due to : Impaired vision;Impaired balance/gait - - - -  Follow up Falls prevention discussed - - - -    FALL RISK PREVENTION PERTAINING TO THE HOME:  Any stairs in or around the home? Yes  If so, are there any without handrails? No  Home free of loose throw rugs in walkways, pet beds, electrical cords, etc? Yes  Adequate lighting in your home to reduce risk of falls? Yes   ASSISTIVE DEVICES UTILIZED  TO PREVENT FALLS:  Life alert? No  Use of a cane, walker or w/c? No  Grab bars in the bathroom? No  Shower chair or bench in shower? Yes  Elevated toilet seat or a handicapped toilet? Yes   TIMED UP AND GO:  Was the test performed? Yes .  Length of time to ambulate 10 feet: 10 sec.   Gait steady and fast without use of assistive device  Cognitive Function: MMSE - Mini Mental State Exam 06/24/2017 12/24/2016  Orientation to time 3 2  Orientation to Place 5 4  Registration 3 3  Attention/ Calculation 5 5  Recall 2 1  Language- name 2 objects 2 2  Language- repeat 1 1  Language- follow 3 step command 3 3  Language- read & follow direction 1 1  Write a sentence 1 1  Copy design 1 1  Total score 27 24   Montreal Cognitive Assessment  07/03/2016  Visuospatial/ Executive (0/5) 5  Naming (0/3) 3  Attention: Read list of digits (0/2) 2  Attention: Read list of letters (0/1) 1  Attention: Serial 7 subtraction starting at 100 (0/3) 3  Language: Repeat phrase (0/2) 2  Language : Fluency (0/1) 1  Abstraction (0/2) 2  Delayed Recall (0/5) 0  Orientation (0/6) 4  Total 23   6CIT Screen 05/28/2020  What Year? 0 points  What month? 3 points  Count back from 20 0 points  Months in reverse 0 points  Repeat phrase 10 points    Immunizations Immunization History  Administered Date(s) Administered  . Influenza Split 12/07/2012  . Influenza, High Dose Seasonal PF 11/27/2015, 11/10/2016, 11/17/2017  . Influenza-Unspecified 12/07/2013, 12/07/2019  . PFIZER(Purple Top)SARS-COV-2 Vaccination 03/30/2019, 04/20/2019, 12/23/2019  . Pneumococcal Conjugate-13 03/14/2013  . Td 03/13/2014    TDAP status: Up to date  Flu Vaccine status:  Up to date per pt   Pneumococcal vaccine status: Up to date  Covid-19 vaccine status: Completed vaccines  Qualifies for Shingles Vaccine? Yes   Zostavax completed No   Shingrix Completed?: No.    Education has been provided regarding the importance  of this vaccine. Patient has been advised to call insurance company to determine out of pocket expense if they have not yet received this vaccine. Advised may also receive vaccine at local pharmacy or Health Dept. Verbalized acceptance and understanding.  Screening Tests Health Maintenance  Topic Date Due  . PNA vac Low Risk Adult (2 of 2 - PPSV23) 03/14/2014  . COLONOSCOPY (Pts 45-46yrs Insurance coverage will need to be confirmed)  05/28/2021 (Originally 07/07/2015)  . TETANUS/TDAP  03/13/2024  . INFLUENZA VACCINE  Completed  . DEXA SCAN  Completed  . COVID-19 Vaccine  Completed  . HPV VACCINES  Aged Out    Health Maintenance  Health Maintenance Due  Topic Date Due  . PNA vac Low Risk Adult (2 of 2 - PPSV23) 03/14/2014    Colorectal cancer screening: No longer required.   Mammogram status: No longer required due to age.  Bone Density status: Completed 02/09/13. Results reflect: Bone density results: OSTEOPOROSIS. Repeat every 0 years.   Additional Screening:  Vision Screening: Recommended annual ophthalmology exams for early detection of glaucoma and other disorders of the eye. Is the patient up to date with their annual eye exam?  Yes  Who is the provider or what is the name of the office in which the patient attends annual eye exams? Dr Katy Fitch If pt is not established with a provider, would they like to be referred to a provider to establish care? No .   Dental Screening: Recommended annual dental exams for proper oral hygiene  Community Resource Referral / Chronic Care Management: CRR required this visit?  No   CCM required this visit?  No      Plan:     I have personally reviewed and noted the following in the patient's chart:   . Medical and social history . Use of alcohol, tobacco or illicit drugs  . Current medications and supplements . Functional ability and status . Nutritional status . Physical activity . Advanced directives . List of other  physicians . Hospitalizations, surgeries, and ER visits in previous 12 months . Vitals . Screenings to include cognitive, depression, and falls . Referrals and appointments  In addition, I have reviewed and discussed with patient certain preventive protocols, quality metrics, and best practice recommendations. A written personalized care plan for preventive services as well as general preventive health recommendations were provided to patient.     Willette Brace, LPN   [id number]   Nurse Notes: None

## 2020-05-28 NOTE — Telephone Encounter (Signed)
Dr. Regis Bill will you resubmit the medications to [facility].   Prescriptions were submitted on 05/23/2020 to [facility].

## 2020-05-29 MED ORDER — CLONAZEPAM 0.5 MG PO TABS: ORAL_TABLET | ORAL | 0 refills | Status: DC

## 2020-05-29 MED ORDER — AMITRIPTYLINE HCL 10 MG PO TABS: 10.0000 mg | ORAL_TABLET | Freq: Every day | ORAL | 1 refills | Status: DC

## 2020-05-29 NOTE — Telephone Encounter (Signed)
LVM for patient to call office, to inform where med had been sent.

## 2020-05-29 NOTE — Telephone Encounter (Signed)
Sent in electronically . To [facility]

## 2020-05-30 NOTE — Telephone Encounter (Signed)
Left a message for the patient to return my call in order to notify that her medications have been sent.

## 2020-06-13 NOTE — Telephone Encounter (Signed)
Noted  

## 2020-06-17 DIAGNOSIS — Z85828 Personal history of other malignant neoplasm of skin: Secondary | ICD-10-CM | POA: Diagnosis not present

## 2020-06-17 DIAGNOSIS — L821 Other seborrheic keratosis: Secondary | ICD-10-CM | POA: Diagnosis not present

## 2020-06-17 DIAGNOSIS — L84 Corns and callosities: Secondary | ICD-10-CM | POA: Diagnosis not present

## 2020-06-17 DIAGNOSIS — B351 Tinea unguium: Secondary | ICD-10-CM | POA: Diagnosis not present

## 2020-06-24 NOTE — Telephone Encounter (Signed)
Patient is calling and stated that her medication was sent to the wrong pharmacy and if it could be transferred to the [facility] on [address], please advise. CB is [phone]

## 2020-06-26 ENCOUNTER — Other Ambulatory Visit: Payer: Self-pay | Admitting: Internal Medicine

## 2020-06-26 MED ORDER — CLONAZEPAM 0.5 MG PO TABS: ORAL_TABLET | ORAL | 0 refills | Status: DC

## 2020-06-26 MED ORDER — AMITRIPTYLINE HCL 10 MG PO TABS: 10.0000 mg | ORAL_TABLET | Freq: Every day | ORAL | 1 refills | Status: DC

## 2020-06-26 NOTE — Progress Notes (Signed)
So I sent Elavil and Klonopin to her pharmacy at [facility]. In the future we need to be specific about where these medicine should be sent.  At each request.

## 2020-06-26 NOTE — Telephone Encounter (Signed)
6 refills Elavil and Klonopin sent to [facility] In the future please have her advise at each refill where and which pharmacy to be sent.  Last was sent to [facility]

## 2020-06-27 NOTE — Telephone Encounter (Signed)
Left a message for the patient to return my call.  

## 2020-07-04 NOTE — Telephone Encounter (Signed)
Left a message for the patient to return my call. Mychart message was sent to the patient regarding the message below.

## 2020-07-22 ENCOUNTER — Other Ambulatory Visit: Payer: Self-pay | Admitting: Internal Medicine

## 2020-07-23 NOTE — Telephone Encounter (Signed)
Last OV: 05/23/2020 Last Refill: 06/26/2020  Disp: 45   R: 0  Future OV: None scheduled    Patient would like RX sent to [facility]

## 2020-07-25 ENCOUNTER — Other Ambulatory Visit: Payer: Self-pay | Admitting: Internal Medicine

## 2020-08-08 DIAGNOSIS — Z85828 Personal history of other malignant neoplasm of skin: Secondary | ICD-10-CM | POA: Diagnosis not present

## 2020-08-08 DIAGNOSIS — L2089 Other atopic dermatitis: Secondary | ICD-10-CM | POA: Diagnosis not present

## 2020-08-08 DIAGNOSIS — L603 Nail dystrophy: Secondary | ICD-10-CM | POA: Diagnosis not present

## 2020-08-08 DIAGNOSIS — L578 Other skin changes due to chronic exposure to nonionizing radiation: Secondary | ICD-10-CM | POA: Diagnosis not present

## 2020-08-08 DIAGNOSIS — D485 Neoplasm of uncertain behavior of skin: Secondary | ICD-10-CM | POA: Diagnosis not present

## 2020-08-08 DIAGNOSIS — D0362 Melanoma in situ of left upper limb, including shoulder: Secondary | ICD-10-CM | POA: Diagnosis not present

## 2020-08-08 DIAGNOSIS — D1801 Hemangioma of skin and subcutaneous tissue: Secondary | ICD-10-CM | POA: Diagnosis not present

## 2020-08-08 DIAGNOSIS — L821 Other seborrheic keratosis: Secondary | ICD-10-CM | POA: Diagnosis not present

## 2020-08-08 DIAGNOSIS — L814 Other melanin hyperpigmentation: Secondary | ICD-10-CM | POA: Diagnosis not present

## 2020-08-19 ENCOUNTER — Telehealth: Payer: Self-pay | Admitting: Internal Medicine

## 2020-08-19 NOTE — Telephone Encounter (Signed)
Last OV: 05/23/2020 Last refill: 07/24/2020  Disp: 30    R: 0 Future OV: None scheduled

## 2020-08-22 ENCOUNTER — Other Ambulatory Visit: Payer: Self-pay | Admitting: Internal Medicine

## 2020-08-22 NOTE — Telephone Encounter (Signed)
Patient contacted the after hours line 06/15 and needs a refill on her clonazePAM (KLONOPIN) 0.5 MG tablet and amitriptyline (ELAVIL) 10 MG tablet  She is completely out of this medication    [place], [place] - [address] Phone:  [phone]  Fax:  [phone]

## 2020-08-23 NOTE — Telephone Encounter (Signed)
Rx sent on 06/15

## 2020-08-26 ENCOUNTER — Other Ambulatory Visit: Payer: Self-pay

## 2020-08-30 NOTE — Telephone Encounter (Signed)
Tiffany w/[facility] pharmacy is calling in stating that they have sent in a script for the pt to start getting her prescriptions through the mail order.  Rx amitriptyline (ELAVIL) 10 MG and clonazepam (KLONOPIN) 0.5 MG are the two that they are needing to get done.

## 2020-09-03 DIAGNOSIS — D0362 Melanoma in situ of left upper limb, including shoulder: Secondary | ICD-10-CM | POA: Diagnosis not present

## 2020-09-03 DIAGNOSIS — L814 Other melanin hyperpigmentation: Secondary | ICD-10-CM | POA: Diagnosis not present

## 2020-09-03 DIAGNOSIS — D2262 Melanocytic nevi of left upper limb, including shoulder: Secondary | ICD-10-CM | POA: Diagnosis not present

## 2020-09-05 ENCOUNTER — Telehealth: Payer: Self-pay | Admitting: Internal Medicine

## 2020-09-05 MED ORDER — AMITRIPTYLINE HCL 10 MG PO TABS: 10.0000 mg | ORAL_TABLET | Freq: Every day | ORAL | 0 refills | Status: DC

## 2020-09-05 MED ORDER — CLONAZEPAM 0.5 MG PO TABS: ORAL_TABLET | ORAL | 0 refills | Status: DC

## 2020-09-05 NOTE — Telephone Encounter (Signed)
Patient's son called to say that his mother Jacqueline Burgess recently has surgery for skin cancer two days ago. He was told by [facility] that he needed to contact her primary care doctor to have someone come out and check out her wound.   Informed son that usually whoever performed the surgery is the one who does that. Son agreed, but was told that he needed to contact Dr. Regis Bill.  Patient's daughter would like a call at [phone] for any updates.  Please advise.

## 2020-09-11 NOTE — Telephone Encounter (Signed)
Left a message for the pts daughter to return my call.   

## 2020-09-11 NOTE — Telephone Encounter (Signed)
I agree wound management should be arranged or followed up through whomever did the surgery. And post op management .

## 2020-09-12 NOTE — Telephone Encounter (Signed)
I spoke with the patient's daughter and informed her of the message below. Pt's daughter verbalized understanding. Pt's daughter reported that they will be returning to Dr. Martin Majestic regarding follow up for wound care. Pt's daughter would like more information in regards to future wound care for the pt as she stated that she in unsure about how to precede with the care of her mother. Pt's daughter expressed that she is concerned about future options regarding her mothers health and would just like advise on how to continue care for the pt. Please advise.

## 2020-09-12 NOTE — Telephone Encounter (Signed)
Could not leave voice message for pt due to mailbox being full.

## 2020-09-13 NOTE — Telephone Encounter (Signed)
I spoke with the pt's daughter and she stated that she would call back at a later time when she is with the pt to schedule an OV.

## 2020-09-13 NOTE — Telephone Encounter (Signed)
Left a message for the pts daughter to return my call.   

## 2020-09-13 NOTE — Telephone Encounter (Signed)
Pts daughter returned the call to the office

## 2020-12-17 ENCOUNTER — Other Ambulatory Visit: Payer: Self-pay | Admitting: Internal Medicine

## 2020-12-19 ENCOUNTER — Telehealth: Payer: Self-pay

## 2020-12-19 NOTE — Telephone Encounter (Signed)
Patient called stating she called the pharmacy and was told Rx needs Dr's authorization patient is asking if CMA would call the pharmacy about Rx below. amitriptyline (ELAVIL) 10 MG tablet

## 2020-12-23 NOTE — Telephone Encounter (Signed)
I have not received fax in order to complete PA. Once I receive fax from pharmacy a PA will be initiated.

## 2020-12-26 ENCOUNTER — Other Ambulatory Visit: Payer: Self-pay | Admitting: Internal Medicine

## 2021-01-13 ENCOUNTER — Other Ambulatory Visit: Payer: Self-pay | Admitting: Internal Medicine

## 2021-01-13 ENCOUNTER — Ambulatory Visit: Payer: Medicare HMO

## 2021-01-14 ENCOUNTER — Ambulatory Visit (INDEPENDENT_AMBULATORY_CARE_PROVIDER_SITE_OTHER): Payer: Medicare HMO | Admitting: Internal Medicine

## 2021-01-14 ENCOUNTER — Encounter: Payer: Self-pay | Admitting: Internal Medicine

## 2021-01-14 ENCOUNTER — Other Ambulatory Visit: Payer: Self-pay

## 2021-01-14 VITALS — BP 126/74 | HR 78 | Temp 97.5°F | Ht 63.0 in | Wt 121.8 lb

## 2021-01-14 DIAGNOSIS — G4700 Insomnia, unspecified: Secondary | ICD-10-CM

## 2021-01-14 DIAGNOSIS — Z79899 Other long term (current) drug therapy: Secondary | ICD-10-CM | POA: Diagnosis not present

## 2021-01-14 DIAGNOSIS — H9190 Unspecified hearing loss, unspecified ear: Secondary | ICD-10-CM

## 2021-01-14 DIAGNOSIS — M810 Age-related osteoporosis without current pathological fracture: Secondary | ICD-10-CM

## 2021-01-14 MED ORDER — CLONAZEPAM 0.5 MG PO TABS: ORAL_TABLET | ORAL | 0 refills | Status: DC

## 2021-01-14 NOTE — Patient Instructions (Signed)
Refill med   continued caution.   Plan Fu  in 6 months or  as indicated

## 2021-01-14 NOTE — Progress Notes (Signed)
Chief Complaint  Patient presents with   Medication Refill     HPI: Jacqueline Burgess 80 y.o. come in for med management   here with daughter  Taking amitriptyline and  low dose clonipen for sleep for years and in recent years  1/4 of a methocarbamol ( after shoulder   injury)  Denies  falling fogginess   . Has  decreased hearing and interested in hearing help.   Last refill was only 15  at [facility]  ran out .  No medical changes  walks    all the time good balance   no new med problem  Memory stable  ROS: See pertinent positives and negatives per HPI.  Past Medical History:  Diagnosis Date   Arthritis    Cataract    lens implant   Closed pelvic fracture ([place]) 2012   pelvis and shoulder    Colon polyps    Hx of fall    fracture pelvis and left shoulder humerus   Hx of nonmelanoma skin cancer    ssca   Hx of syncope    had neg event monitor per dr Marya Amsler taylor in 2010   Hypercalciuria    pu on hctz per record   Kidney stones 1967   MVP (mitral valve prolapse)    in records  no echo eval in records   Osteoporosis    eval dr Buddy Duty in past dexa 11 12     Family History  Problem Relation Age of Onset   CVA Mother        Died in her 35W complications rheumatic  fever afterwards when she got a stroke   Rheumatic fever Mother    Lung cancer Sister        Died in 66s was a smoker   Thyroid cancer Daughter        Papillary    Social History   Socioeconomic History   Marital status: Married    Spouse name: Not on file   Number of children: Not on file   Years of education: Not on file   Highest education level: Not on file  Occupational History   Not on file  Tobacco Use   Smoking status: Never   Smokeless tobacco: Never  Vaping Use   Vaping Use: Never used  Substance and Sexual Activity   Alcohol use: Yes    Alcohol/week: 3.0 standard drinks    Types: 3 Standard drinks or equivalent per week   Drug use: No   Sexual activity: Yes    Partners: Female   Other Topics Concern   Not on file  Social History Narrative   Married retired Pharmacist, 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   g4 P4   Regular exercise: walks daily   Caffeine use: 2 cups of coffee daily      Social Determinants of Health   Financial Resource Strain: Low Risk    Difficulty of Paying Living Expenses: Not hard at all  Food Insecurity: No Food Insecurity   Worried About Charity fundraiser in the Last Year: Never true   Ran Out of Food in the Last Year: Never true  Transportation Needs: No Transportation Needs   Lack of Transportation (Medical): No   Lack of Transportation (Non-Medical): No  Physical Activity: Sufficiently Active  Days of Exercise per Week: 7 days   Minutes of Exercise per Session: 60 min  Stress: No Stress Concern Present   Feeling of Stress : Not at all  Social Connections: Socially Integrated   Frequency of Communication with Friends and Family: Once a week   Frequency of Social Gatherings with Friends and Family: Three times a week   Attends Religious Services: More than 4 times per year   Active Member of Clubs or Organizations: Yes   Attends Archivist Meetings: 1 to 4 times per year   Marital Status: Married    Outpatient Medications Prior to Visit  Medication Sig Dispense Refill   amitriptyline (ELAVIL) 10 MG tablet TAKE 1 TABLET BY MOUTH AT BEDTIME 90 tablet 0   Calcium Carbonate-Vitamin D (CALCIUM-VITAMIN D) 500-200 MG-UNIT per tablet Take 1 tablet by mouth 2 (two) times daily with a meal. Reported on 09/27/2015     FLUZONE HIGH-DOSE QUADRIVALENT 0.7 ML SUSY      methocarbamol (ROBAXIN) 500 MG tablet      Multiple Vitamins-Minerals (MULTIVITAL) tablet Take 1 tablet by mouth daily. Reported on 09/27/2015     mupirocin ointment (BACTROBAN) 2 %      tretinoin (RETIN-A) 0.025 % cream  Reported on 09/27/2015     clonazePAM (KLONOPIN) 0.5 MG tablet TAKE 1/2 (ONE-HALF) TABLET BY MOUTH AT BEDTIME AS NEEDED 45 tablet 0   No facility-administered medications prior to visit.     EXAM:  BP 126/74 (BP Location: Left Arm, Patient Position: Sitting, Cuff Size: Normal)   Pulse 78   Temp (!) 97.5 F (36.4 C) (Oral)   Ht 5\' 3"  (1.6 m)   Wt 121 lb 12.8 oz (55.2 kg)   SpO2 94%   BMI 21.58 kg/m   Body mass index is 21.58 kg/m.  GENERAL: vitals reviewed and listed above, alert, oriented, appears well hydrated and in no acute distress HEENT: atraumatic, conjunctiva  clear, no obvious abnormalities on inspection of external nose and ears OP :masked  NECK: no obvious masses on inspection palpation  LUNGS: clear to auscultation bilaterally, no wheezes, rales or rhonchi, good air movement CV: HRRR, no clubbing cyanosis or  peripheral edema nl cap refill  MS: moves all extremities without noticeable focal  abnormality Nl gait easy and good balance  non focal neuro but dec  hearing  PSYCH: pleasant and cooperative, no obvious depression or anxiety Lab Results  Component Value Date   WBC 7.2 05/08/2015   HGB 15.2 (H) 05/08/2015   HCT 45.4 05/08/2015   PLT 178.0 05/08/2015   GLUCOSE 99 05/08/2015   CHOL 211 (H) 07/31/2016   TRIG 83.0 07/31/2016   HDL 68.40 07/31/2016   LDLDIRECT 136.8 03/14/2013   LDLCALC 126 (H) 07/31/2016   ALT 19 02/26/2012   AST 24 02/26/2012   NA 136 05/08/2015   K 4.7 05/08/2015   CL 98 05/08/2015   CREATININE 0.77 05/08/2015   BUN 17 05/08/2015   CO2 28 05/08/2015   TSH 0.83 07/03/2016   INR 0.98 11/11/2010   BP Readings from Last 3 Encounters:  01/14/21 126/74  05/28/20 110/78  08/16/19 102/68    ASSESSMENT AND PLAN:  Discussed the following assessment and plan:  Medication management  Insomnia, unspecified type  High risk medication use  Osteoporosis, unspecified osteoporosis type, unspecified pathological fracture  presence  Decreased hearing, unspecified laterality Reviewed medicine risk-benefit taking small amount Klonopin quarter of a pill of muscle relaxant and amitriptyline feels quite well with this  would like to continue. [facility]  only filled 15 of 45 klonipen  on oct 12   and bottle  confirms this   and we were not notified. We should have notified and  written on bottle .  Discussed reevaluation audiology ENT or [facility]  hearing assistance would help her .  Disc immunizations   -Patient advised to return or notify health care team  if  new concerns arise.  Patient Instructions  Refill med   continued caution.   Plan Fu  in 6 months or  as indicated   Mariann Laster K. Abdel Effinger M.D.

## 2021-01-15 ENCOUNTER — Telehealth: Payer: Self-pay

## 2021-01-15 NOTE — Telephone Encounter (Signed)
I spoke with Katrina at [facility] and she stated the pt was given 15 tablets because the pt's insurance limits her to a month supply. The sig on pt's medication is to take 1/2 tablet daily and 15 tablets is 1 months supply.

## 2021-01-15 NOTE — Telephone Encounter (Signed)
-----   Message from Burnis Medin, MD sent at 01/14/2021  5:39 PM EST ----- Please contact [facility] and see  if their records show the rx from [facility] for 45 klonipen on [address] 12 was received  and that only 15 were given.  They should have notified  us that only partial rx was given and don't see that documented.

## 2021-01-15 NOTE — Telephone Encounter (Signed)
Please contact patient and daughter and let them know    So in future we should send in rx  for one month at a time  with refills

## 2021-01-15 NOTE — Telephone Encounter (Signed)
Pt's daughter informed about rx and verbalized understanding. I left a message for the pt to return my call.

## 2021-02-08 ENCOUNTER — Other Ambulatory Visit: Payer: Self-pay | Admitting: Internal Medicine

## 2021-02-10 ENCOUNTER — Telehealth: Payer: Self-pay

## 2021-02-10 NOTE — Telephone Encounter (Signed)
RX sent

## 2021-02-10 NOTE — Telephone Encounter (Signed)
Patient called asking for Rx refill patient stated she has not slept well since going without patient is also asking for call back to discuss whether refill will be sent to the pharmacy today amitriptyline (ELAVIL) 10 MG tablet

## 2021-02-10 NOTE — Telephone Encounter (Signed)
Please disregard

## 2021-02-27 ENCOUNTER — Emergency Department (HOSPITAL_BASED_OUTPATIENT_CLINIC_OR_DEPARTMENT_OTHER): Payer: Medicare HMO

## 2021-02-27 ENCOUNTER — Emergency Department (HOSPITAL_BASED_OUTPATIENT_CLINIC_OR_DEPARTMENT_OTHER)
Admission: EM | Admit: 2021-02-27 | Discharge: 2021-02-27 | Disposition: A | Discharge status: Home / Self Care | Payer: Medicare HMO | Attending: Emergency Medicine | Admitting: Emergency Medicine

## 2021-02-27 ENCOUNTER — Other Ambulatory Visit: Payer: Self-pay

## 2021-02-27 ENCOUNTER — Encounter (HOSPITAL_BASED_OUTPATIENT_CLINIC_OR_DEPARTMENT_OTHER): Payer: Self-pay

## 2021-02-27 DIAGNOSIS — S52501A Unspecified fracture of the lower end of right radius, initial encounter for closed fracture: Secondary | ICD-10-CM | POA: Insufficient documentation

## 2021-02-27 DIAGNOSIS — S6991XA Unspecified injury of right wrist, hand and finger(s), initial encounter: Secondary | ICD-10-CM | POA: Diagnosis present

## 2021-02-27 DIAGNOSIS — W108XXA Fall (on) (from) other stairs and steps, initial encounter: Secondary | ICD-10-CM | POA: Diagnosis not present

## 2021-02-27 DIAGNOSIS — M25531 Pain in right wrist: Secondary | ICD-10-CM | POA: Insufficient documentation

## 2021-02-27 NOTE — ED Provider Notes (Signed)
[facility] EMERGENCY DEPT Provider Note   CSN: [id number] Arrival date & time: 02/27/21  8527     History Chief Complaint  Patient presents with   Fall   Wrist Pain    Jacqueline Burgess is a 80 y.o. female.  HPI 80 year old female presents after falling down the stairs.  She slipped and slid down and thinks she must of landed on her right arm.  She is having pain to her right radial wrist.  Her fingers feel tingly but otherwise no numbness or weakness.  She took some Tylenol prior to arrival and currently has ice on it.  Did not hit her head or suffer any other injury.  Past Medical History:  Diagnosis Date   Arthritis    Cataract    lens implant   Closed pelvic fracture ([place]) 2012   pelvis and shoulder    Colon polyps    Hx of fall    fracture pelvis and left shoulder humerus   Hx of nonmelanoma skin cancer    ssca   Hx of syncope    had neg event monitor per dr Marya Amsler taylor in 2010   Hypercalciuria    pu on hctz per record   Kidney stones 1967   MVP (mitral valve prolapse)    in records  no echo eval in records   Osteoporosis    eval dr Buddy Duty in past dexa 11 12     Patient Active Problem List   Diagnosis Date Noted   Mild cognitive impairment 12/24/2016   Decreased hearing 11/10/2016   Ingrown toenail 10/06/2015   Bilateral bunions 01/08/2015   Metatarsalgia of both feet 01/08/2015   Wears hearing aid 03/17/2014   Palpitations 09/11/2013   Osteoporosis, unspecified 03/14/2013   High risk medication use 03/14/2013   Elevated lipids 03/14/2013   Postmenopausal atrophic vaginitis 09/17/2012   Bright red rectal bleeding 09/12/2012   Pain in joint, ankle and foot 06/28/2012   Idiopathic scoliosis 06/28/2012   Other and unspecified hyperlipidemia 04/02/2012   Hx of syncope    Urinary frequency 03/14/2012   Osteoporosis 02/06/2012   History of renal stone 02/06/2012   Abdominal discomfort 02/06/2012   Hx of fall    Hx of nonmelanoma skin  cancer    Insomnia 02/03/2012   Arthritis    Colon polyps     Past Surgical History:  Procedure Laterality Date   CATARACT EXTRACTION     left   [place]   MOHS SURGERY  2017   SHOULDER SURGERY  11/2010     OB History     Gravida  4   Para  4   Term      Preterm      AB      Living         SAB      IAB      Ectopic      Multiple      Live Births              Family History  Problem Relation Age of Onset   CVA Mother        Died in her 78E complications rheumatic  fever afterwards when she got a stroke   Rheumatic fever Mother    Lung cancer Sister        Died in 57s was a smoker   Thyroid cancer Daughter  Papillary    Social History   Tobacco Use   Smoking status: Never   Smokeless tobacco: Never  Vaping Use   Vaping Use: Never used  Substance Use Topics   Alcohol use: Yes    Alcohol/week: 3.0 standard drinks    Types: 3 Standard drinks or equivalent per week   Drug use: No    Home Medications Prior to Admission medications   Medication Sig Start Date End Date Taking? Authorizing Provider  amitriptyline (ELAVIL) 10 MG tablet TAKE 1 TABLET BY MOUTH AT BEDTIME 02/10/21   Panosh, Standley Brooking, MD  Calcium Carbonate-Vitamin D (CALCIUM-VITAMIN D) 500-200 MG-UNIT per tablet Take 1 tablet by mouth 2 (two) times daily with a meal. Reported on 09/27/2015    [provider]  clonazePAM (KLONOPIN) 0.5 MG tablet TAKE 1/2 (ONE-HALF) TABLET BY MOUTH AT BEDTIME AS NEEDED 01/14/21   Panosh, Standley Brooking, MD  FLUZONE HIGH-DOSE QUADRIVALENT 0.7 ML SUSY  12/07/19   [provider]  methocarbamol (ROBAXIN) 500 MG tablet  12/20/19   [provider]  Multiple Vitamins-Minerals (MULTIVITAL) tablet Take 1 tablet by mouth daily. Reported on 09/27/2015    [provider]  mupirocin ointment (BACTROBAN) 2 %  05/18/19   [provider]  tretinoin (RETIN-A) 0.025 % cream Reported on  09/27/2015 07/26/12   [provider]    Allergies    Pseudoephedrine hcl and Sulfa antibiotics  Review of Systems   Review of Systems  Musculoskeletal:  Positive for arthralgias.  Neurological:  Negative for weakness.   Physical Exam Updated Vital Signs BP (!) 163/87 (BP Location: Left Arm)    Pulse 77    Temp 98.3 F (36.8 C) (Oral)    Resp 16    SpO2 97%   Physical Exam Vitals and nursing note reviewed.  Constitutional:      Appearance: She is well-developed.  HENT:     Head: Normocephalic and atraumatic.     Right Ear: External ear normal.     Left Ear: External ear normal.     Nose: Nose normal.  Eyes:     General:        Right eye: No discharge.        Left eye: No discharge.  Cardiovascular:     Rate and Rhythm: Normal rate and regular rhythm.     Pulses:          Radial pulses are 2+ on the right side.  Pulmonary:     Effort: Pulmonary effort is normal.  Abdominal:     General: There is no distension.  Musculoskeletal:     Right wrist: Swelling and tenderness (radial) present. No deformity or snuff box tenderness. Normal range of motion (is painful but she can flex/extend).     Right hand: No swelling or tenderness. Normal range of motion.     Comments: Normal sensation in right hand  Skin:    General: Skin is warm and dry.  Neurological:     Mental Status: She is alert.  Psychiatric:        Mood and Affect: Mood is not anxious.    ED Results / Procedures / Treatments   Labs (all labs ordered are listed, but only abnormal results are displayed) Labs Reviewed - No data to display  EKG None  Radiology DG Wrist Complete Right  Result Date: 02/27/2021 CLINICAL DATA:  80 year old female status post fall with pain. EXAM: RIGHT WRIST - COMPLETE 3+ VIEW COMPARISON:  None. FINDINGS: Comminuted  distal right radius fracture with mild dorsal impaction. DRU and radiocarpal joint involvement. Distal ulna and carpal bones appear intact and aligned.  Metacarpals intact. First CMC osteoarthritis. IMPRESSION: Comminuted distal right radius fracture with mild dorsal impaction. DRU and radiocarpal joint involvement. Electronically Signed   By: Genevie Ann M.D.   On: 02/27/2021 07:38    Procedures Procedures   Medications Ordered in ED Medications - No data to display  ED Course  I have reviewed the triage vital signs and the nursing notes.  Pertinent labs & imaging results that were available during my care of the patient were reviewed by me and considered in my medical decision making (see chart for details).    MDM Rules/Calculators/A&P                         Patient is neurovascularly intact.  I have reviewed the x-ray images which show a distal radius fracture.  There is some slight displacement but it does not appear deformed to the point of needing reduction.  She appears fairly comfortable.  She has declined anything for pain and would like to just take OTC meds at home.  We will put in a sugar-tong splint and have her follow-up with her orthopedist, Dr. Mardelle Matte.    Final Clinical Impression(s) / ED Diagnoses Final diagnoses:  Closed fracture of distal end of right radius, unspecified fracture morphology, initial encounter    Rx / DC Orders ED Discharge Orders     None        Sherwood Gambler, MD 02/27/21 [phone]

## 2021-02-27 NOTE — ED Triage Notes (Signed)
She tells me that she "slipped and fell while carrying paperwork this morning. She c/o right wrist pain. She denies striking her head, nor any other injury. She is ambulatory and in no distress.

## 2021-03-04 DIAGNOSIS — S52501A Unspecified fracture of the lower end of right radius, initial encounter for closed fracture: Secondary | ICD-10-CM | POA: Diagnosis not present

## 2021-03-12 DIAGNOSIS — M25561 Pain in right knee: Secondary | ICD-10-CM | POA: Diagnosis not present

## 2021-03-12 DIAGNOSIS — S52501D Unspecified fracture of the lower end of right radius, subsequent encounter for closed fracture with routine healing: Secondary | ICD-10-CM | POA: Diagnosis not present

## 2021-03-22 ENCOUNTER — Other Ambulatory Visit: Payer: Self-pay | Admitting: Internal Medicine

## 2021-03-24 ENCOUNTER — Telehealth: Payer: Self-pay | Admitting: Internal Medicine

## 2021-03-24 ENCOUNTER — Other Ambulatory Visit: Payer: Self-pay

## 2021-03-24 MED ORDER — AMITRIPTYLINE HCL 10 MG PO TABS: 10.0000 mg | ORAL_TABLET | Freq: Every day | ORAL | 0 refills | Status: DC

## 2021-03-24 NOTE — Telephone Encounter (Signed)
Husband Pilar Plate spoke to nurse triage--he stated his wife needs Amitriptyline and clonazepam refilled.  Pt is unable to sleep without taking them.  He stated she takes them 30 mins before bed but she is out of the medication so she couldn't sleep last night.

## 2021-03-24 NOTE — Telephone Encounter (Signed)
Rx sent to the pharmacy.

## 2021-04-11 DIAGNOSIS — S52501D Unspecified fracture of the lower end of right radius, subsequent encounter for closed fracture with routine healing: Secondary | ICD-10-CM | POA: Diagnosis not present

## 2021-05-12 DIAGNOSIS — S52501D Unspecified fracture of the lower end of right radius, subsequent encounter for closed fracture with routine healing: Secondary | ICD-10-CM | POA: Diagnosis not present

## 2021-06-03 ENCOUNTER — Ambulatory Visit: Payer: Medicare HMO

## 2021-06-16 DIAGNOSIS — S52501D Unspecified fracture of the lower end of right radius, subsequent encounter for closed fracture with routine healing: Secondary | ICD-10-CM | POA: Diagnosis not present

## 2021-06-17 ENCOUNTER — Telehealth: Payer: Self-pay | Admitting: Internal Medicine

## 2021-06-17 NOTE — Telephone Encounter (Signed)
Left message for patient to call back and schedule Medicare Annual Wellness Visit (AWV) either virtually or in office. Left  my jabber number [phone]   Last AWV ;05/28/20  please schedule at anytime with [facility] Nurse Health Advisor 1 or 2    

## 2021-06-25 ENCOUNTER — Other Ambulatory Visit: Payer: Self-pay | Admitting: Internal Medicine

## 2021-06-26 NOTE — Telephone Encounter (Signed)
Last Ov and filled 01/14/21 ?Is it ok to refill? ?

## 2021-07-11 ENCOUNTER — Telehealth: Payer: Self-pay | Admitting: Internal Medicine

## 2021-07-11 NOTE — Telephone Encounter (Signed)
Left message for patient to call back and schedule Medicare Annual Wellness Visit (AWV) either virtually or in office. Left  my jabber number [phone]   Last AWV ;05/28/20  please schedule at anytime with [facility] Nurse Health Advisor 1 or 2    

## 2021-07-15 ENCOUNTER — Ambulatory Visit (INDEPENDENT_AMBULATORY_CARE_PROVIDER_SITE_OTHER): Payer: Medicare HMO | Admitting: Internal Medicine

## 2021-07-15 ENCOUNTER — Ambulatory Visit: Payer: Medicare HMO | Admitting: Internal Medicine

## 2021-07-15 ENCOUNTER — Encounter: Payer: Self-pay | Admitting: Internal Medicine

## 2021-07-15 VITALS — BP 120/70 | HR 83 | Temp 98.5°F | Ht 63.0 in | Wt 118.6 lb

## 2021-07-15 DIAGNOSIS — H9190 Unspecified hearing loss, unspecified ear: Secondary | ICD-10-CM | POA: Diagnosis not present

## 2021-07-15 DIAGNOSIS — M810 Age-related osteoporosis without current pathological fracture: Secondary | ICD-10-CM | POA: Diagnosis not present

## 2021-07-15 DIAGNOSIS — Z8616 Personal history of COVID-19: Secondary | ICD-10-CM

## 2021-07-15 DIAGNOSIS — Z79899 Other long term (current) drug therapy: Secondary | ICD-10-CM

## 2021-07-15 DIAGNOSIS — G4700 Insomnia, unspecified: Secondary | ICD-10-CM

## 2021-07-15 LAB — CBC WITH DIFFERENTIAL/PLATELET
Basophils Absolute: 0 10*3/uL (ref 0.0–0.1)
Basophils Relative: 0.3 % (ref 0.0–3.0)
Eosinophils Absolute: 0.1 10*3/uL (ref 0.0–0.7)
Eosinophils Relative: 0.9 % (ref 0.0–5.0)
HCT: 41.7 % (ref 36.0–46.0)
Hemoglobin: 13.8 g/dL (ref 12.0–15.0)
Lymphocytes Relative: 23.9 % (ref 12.0–46.0)
Lymphs Abs: 1.7 10*3/uL (ref 0.7–4.0)
MCHC: 33.2 g/dL (ref 30.0–36.0)
MCV: 91.9 fl (ref 78.0–100.0)
Monocytes Absolute: 0.7 10*3/uL (ref 0.1–1.0)
Monocytes Relative: 9.1 % (ref 3.0–12.0)
Neutro Abs: 4.8 10*3/uL (ref 1.4–7.7)
Neutrophils Relative %: 65.8 % (ref 43.0–77.0)
Platelets: 195 10*3/uL (ref 150.0–400.0)
RBC: 4.54 Mil/uL (ref 3.87–5.11)
RDW: 13.6 % (ref 11.5–15.5)
WBC: 7.3 10*3/uL (ref 4.0–10.5)

## 2021-07-15 LAB — TSH: TSH: 0.9 u[IU]/mL (ref 0.35–5.50)

## 2021-07-15 LAB — BASIC METABOLIC PANEL
BUN: 20 mg/dL (ref 6–23)
CO2: 28 mEq/L (ref 19–32)
Calcium: 9.1 mg/dL (ref 8.4–10.5)
Chloride: 98 mEq/L (ref 96–112)
Creatinine, Ser: 0.72 mg/dL (ref 0.40–1.20)
GFR: 77.67 mL/min (ref >60.00)
Glucose, Bld: 91 mg/dL (ref 70–99)
Potassium: 4.3 mEq/L (ref 3.5–5.1)
Sodium: 134 mEq/L — ABNORMAL LOW (ref 135–145)

## 2021-07-15 LAB — VITAMIN D 25 HYDROXY (VIT D DEFICIENCY, FRACTURES): VITD: 27.34 ng/mL — ABNORMAL LOW (ref 30.00–100.00)

## 2021-07-15 MED ORDER — AMITRIPTYLINE HCL 10 MG PO TABS
10.0000 mg | ORAL_TABLET | Freq: Every day | ORAL | 2 refills | Status: DC
Start: 2021-07-15 — End: 2022-03-30

## 2021-07-15 MED ORDER — CLONAZEPAM 0.5 MG PO TABS: ORAL_TABLET | ORAL | 0 refills | Status: DC

## 2021-07-15 NOTE — Progress Notes (Signed)
? ?Chief Complaint  ?Patient presents with  ? Follow-up  ?  Medication refill ? covid positive in January  ?Cough   ? ? ?HPI: ?Jacqueline Burgess 81 y.o. come in for Chronic disease management here with her daughter. ?Radial right  fracture  2 23  fall  on magazine.  That was left on the stairs ?2014  last dexa?  Past history of alendronate stopped because of concerns about jaw and Prolia had a side effect if it was eyes infection and question a rash of a rash.  Saw Dr. Steve Rattler 2014 for evaluation plan was to exercise vitamin and vitamin D ?She does walking frequently enough to say the grocery and feels pretty good no falls otherwise ? ?Takes 10 mg amitriptyline and a half of the clonazepam around    30 minutes before bed.  Like she sleeps well and gets up in the morning ?Not taking  robaxin . ? ?Also taking multivitamin   with D.  ?Had COVID in January still has residual occasional cough but no chest pain shortness of breath ?Is hard of hearing considering hearing assistant cost is an issue. ?ROS: See pertinent positives and negatives per HPI. ? ?Past Medical History:  ?Diagnosis Date  ? Arthritis   ? Cataract   ? lens implant  ? Closed pelvic fracture ([place]) 2012  ? pelvis and shoulder   ? Colon polyps   ? Hx of fall   ? fracture pelvis and left shoulder humerus  ? Hx of nonmelanoma skin cancer   ? ssca  ? Hx of syncope   ? had neg event monitor per dr Marya Amsler taylor in 2010  ? Hypercalciuria   ? pu on hctz per record  ? Kidney stones 1967  ? MVP (mitral valve prolapse)   ? in records  no echo eval in records  ? Osteoporosis   ? eval dr Buddy Duty in past dexa 11 12   ? ? ?Family History  ?Problem Relation Age of Onset  ? CVA Mother   ?     Died in her 07M complications rheumatic  fever afterwards when she got a stroke  ? Rheumatic fever Mother   ? Lung cancer Sister   ?     Died in 67s was a smoker  ? Thyroid cancer Daughter   ?     Papillary  ? ? ?Social History  ? ?Socioeconomic History  ? Marital status: Married  ?  Spouse  name: Not on file  ? Number of children: Not on file  ? Years of education: Not on file  ? Highest education level: Not on file  ?Occupational History  ? Not on file  ?Tobacco Use  ? Smoking status: Never  ? Smokeless tobacco: Never  ?Vaping Use  ? Vaping Use: Never used  ?Substance and Sexual Activity  ? Alcohol use: Yes  ?  Alcohol/week: 3.0 standard drinks  ?  Types: 3 Standard drinks or equivalent per week  ? Drug use: No  ? Sexual activity: Yes  ?  Partners: Female  ?Other Topics Concern  ? Not on file  ?Social History Narrative  ? Married retired Pharmacist, 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  ? g4 P4  ? Regular exercise: walks daily  ? Caffeine use: 2 cups of coffee daily  ?   ? ?  Social Determinants of Health  ? ?Financial Resource Strain: Not on file  ?Food Insecurity: Not on file  ?Transportation Needs: Not on file  ?Physical Activity: Not on file  ?Stress: Not on file  ?Social Connections: Not on file  ? ? ?Outpatient Medications Prior to Visit  ?Medication Sig Dispense Refill  ? Calcium Carbonate-Vitamin D (CALCIUM-VITAMIN D) 500-200 MG-UNIT per tablet Take 1 tablet by mouth 2 (two) times daily with a meal. Reported on 09/27/2015    ? FLUZONE HIGH-DOSE QUADRIVALENT 0.7 ML SUSY     ? methocarbamol (ROBAXIN) 500 MG tablet     ? Multiple Vitamins-Minerals (MULTIVITAL) tablet Take 1 tablet by mouth daily. Reported on 09/27/2015    ? mupirocin ointment (BACTROBAN) 2 %     ? tretinoin (RETIN-A) 0.025 % cream Reported on 09/27/2015    ? amitriptyline (ELAVIL) 10 MG tablet Take 1 tablet (10 mg total) by mouth at bedtime. 90 tablet 0  ? clonazePAM (KLONOPIN) 0.5 MG tablet TAKE 1/2 (ONE-HALF) TABLET BY MOUTH AT BEDTIME AS NEEDED 45 tablet 0  ? ?No facility-administered medications prior to visit.  ? ? ? ?EXAM: ? ?BP 120/70 (BP Location: Left Arm, Patient Position: Sitting,  Cuff Size: Normal)   Pulse 83   Temp 98.5 ?F (36.9 ?C) (Oral)   Ht '5\' 3"'$  (1.6 m)   Wt 118 lb 9.6 oz (53.8 kg)   SpO2 97%   BMI 21.01 kg/m?  ? ?Body mass index is 21.01 kg/m?. ? ?GENERAL: vitals reviewed and listed above, alert, oriented, appears well hydrated and in no acute distress ?HEENT: atraumatic, conjunctiva  clear, no obvious abnormalities on inspection of external nose and ears  ?NECK: no obvious masses on inspection palpation  ?LUNGS: clear to auscultation bilaterally, no wheezes, rales or rhonchi, good air movement ?CV: HRRR, no clubbing cyanosis or  peripheral edema nl cap refill  ?Abdomen soft without again megaly guarding or rebound ?MS: moves all extremities without noticeable focal  abnormality ?Skin SK sun changes no acute findings ?Back some scoliosis kyphosis ?PSYCH: pleasant and cooperative, no obvious depression or anxiety ?Lab Results  ?Component Value Date  ? WBC 7.3 07/15/2021  ? HGB 13.8 07/15/2021  ? HCT 41.7 07/15/2021  ? PLT 195.0 07/15/2021  ? GLUCOSE 91 07/15/2021  ? CHOL 211 (H) 07/31/2016  ? TRIG 83.0 07/31/2016  ? HDL 68.40 07/31/2016  ? LDLDIRECT 136.8 03/14/2013  ? LDLCALC 126 (H) 07/31/2016  ? ALT 19 02/26/2012  ? AST 24 02/26/2012  ? NA 134 (L) 07/15/2021  ? K 4.3 07/15/2021  ? CL 98 07/15/2021  ? CREATININE 0.72 07/15/2021  ? BUN 20 07/15/2021  ? CO2 28 07/15/2021  ? TSH 0.90 07/15/2021  ? INR 0.98 11/11/2010  ? ?BP Readings from Last 3 Encounters:  ?07/15/21 120/70  ?02/27/21 (!) 163/87  ?01/14/21 126/74  ? ? ?ASSESSMENT AND PLAN: ? ?Discussed the following assessment and plan: ? ?Osteoporosis, unspecified osteoporosis type, unspecified pathological fracture presence - Plan: CBC with Differential/Platelet, Basic metabolic panel, TSH, VITAMIN D 25 Hydroxy (Vit-D Deficiency, Fractures), VITAMIN D 25 Hydroxy (Vit-D Deficiency, Fractures), TSH, Basic metabolic panel, CBC with Differential/Platelet ? ?Medication management - Plan: CBC with Differential/Platelet, Basic  metabolic panel, TSH, VITAMIN D 25 Hydroxy (Vit-D Deficiency, Fractures), VITAMIN D 25 Hydroxy (Vit-D Deficiency, Fractures), TSH, Basic metabolic panel, CBC with Differential/Platelet ? ?High risk medication use ? ?Insomnia, unspecified type - Plan: CBC with Differential/Platelet, Basic metabolic panel, TSH, TSH, Basic metabolic panel, CBC with Differential/Platelet ? ?Decreased hearing, unspecified  laterality ? 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 ?Advise weight other drowsy producing medications. ?History of fracture she reports that she fell quite hard has healed. ?May be time to take another local reassessment about osteoporosis intervention we will get labs currently ?Order DEXA scan at [facility]. ?Then go from there. ?Memory and conversation seems  normal today  not tested  ?-Patient advised to return or notify health care team  if  new concerns arise. ? ?Patient Instructions  ?Will send order to [facility]   for dexa scan .  ? ?Consider talking  with endocrinology  about options.  ? ?Continued caution with  medications. Refilled .  ?Lab today and refills  ? ? ?Standley Brooking. Jahzir Strohmeier M.D. ?

## 2021-07-15 NOTE — Patient Instructions (Addendum)
Will send order to [facility]   for dexa scan .  ? ?Consider talking  with endocrinology  about options.  ? ?Continued caution with  medications. Refilled .  ?Lab today and refills  ? ? ?

## 2021-07-22 ENCOUNTER — Telehealth: Payer: Self-pay | Admitting: Internal Medicine

## 2021-07-22 DIAGNOSIS — H00025 Hordeolum internum left lower eyelid: Secondary | ICD-10-CM | POA: Diagnosis not present

## 2021-07-22 NOTE — Telephone Encounter (Signed)
Pt's daughter calling regarding lab results. Patient is having surgery/excision on her left eye Tuesday morning at 8. Wants to make sure that her labs are in a safe range for her to have this surgery/excision ?

## 2021-07-23 NOTE — Telephone Encounter (Signed)
Last Ov 07/15/21 ?Please advise  ?

## 2021-07-23 NOTE — Telephone Encounter (Signed)
Yes labs are ok excpet borderline  low vit d   ?See result note advice .

## 2021-07-23 NOTE — Progress Notes (Signed)
Lab results are good except VIt D level  is slightly low   we need to have you take  more vit D 3 .   ?Increase Vitamin d3 to 2000 iu per day ( I think you are taking only 500 - 1000 iu )

## 2021-07-23 NOTE — Telephone Encounter (Signed)
Daughter Nunzio Cory informed of results  ?

## 2021-07-29 DIAGNOSIS — H00025 Hordeolum internum left lower eyelid: Secondary | ICD-10-CM | POA: Diagnosis not present

## 2021-07-29 DIAGNOSIS — L980 Pyogenic granuloma: Secondary | ICD-10-CM | POA: Diagnosis not present

## 2021-08-14 ENCOUNTER — Telehealth: Payer: Self-pay | Admitting: Internal Medicine

## 2021-08-14 NOTE — Telephone Encounter (Signed)
Left message for patient to call back and schedule Medicare Annual Wellness Visit (AWV) either virtually or in office. Left  my jabber number [phone]   Last AWV ;05/28/20  please schedule at anytime with [facility] Nurse Health Advisor 1 or 2    

## 2021-09-26 ENCOUNTER — Telehealth: Payer: Self-pay | Admitting: Internal Medicine

## 2021-09-26 NOTE — Telephone Encounter (Signed)
Left message for patient to call back and schedule Medicare Annual Wellness Visit (AWV) either virtually or in office. Left  my Herbie Drape number [phone]   Last AWV ;05/28/20  please schedule at anytime with [facility] Nurse Health Advisor 1 or 2

## 2021-10-20 ENCOUNTER — Telehealth: Payer: Self-pay | Admitting: Internal Medicine

## 2021-10-20 NOTE — Telephone Encounter (Signed)
[name], son of Pt, called to inquire about the status of the FMLA paperwork faxed to [place] last week.  Are they completed yet? When can he come by to pick them up?  Please call him back at: [phone]

## 2021-10-21 NOTE — Telephone Encounter (Signed)
Spoke to son, [name]. Inform him, Dr. Regis Bill would need an appointment for the form to be filled out. He is to call our office to schedule an appt.

## 2021-10-23 ENCOUNTER — Encounter: Payer: Self-pay | Admitting: Internal Medicine

## 2021-10-23 ENCOUNTER — Telehealth (INDEPENDENT_AMBULATORY_CARE_PROVIDER_SITE_OTHER): Payer: Medicare HMO | Admitting: Internal Medicine

## 2021-10-23 VITALS — Ht 63.0 in | Wt 115.0 lb

## 2021-10-23 DIAGNOSIS — Z636 Dependent relative needing care at home: Secondary | ICD-10-CM | POA: Diagnosis not present

## 2021-10-23 DIAGNOSIS — H9190 Unspecified hearing loss, unspecified ear: Secondary | ICD-10-CM

## 2021-10-23 DIAGNOSIS — M810 Age-related osteoporosis without current pathological fracture: Secondary | ICD-10-CM | POA: Diagnosis not present

## 2021-10-23 DIAGNOSIS — Z79899 Other long term (current) drug therapy: Secondary | ICD-10-CM

## 2021-10-23 NOTE — Progress Notes (Signed)
Virtual Visit via Video Note  I connected with Jacqueline Burgess on 10/23/21 at  9:30 AM EDT by a video enabled telemedicine application and verified that I am speaking with the correct person using two identifiers. Location patient: home Location providerhome office Persons participating in the virtual visit: patient, provider son [name]   Patient aware  of the limitations of evaluation and management by telemedicine and  availability of in person appointments. and agreed to proceed.   HPI: Jacqueline Burgess and son [name] presents for video visit to assess help for ongoing care.  She is doing okay today still battling problems with her breast no longer drives.  No acute problems today. She lives at home with her husband and family visits to check on them frequently. She does have some decreased hearing but is able to do most self-care but needs help with driving to go to grocery shopping doctor's appointments etc. Son is a full-time working Freight forwarder but has been given hours change in less than 24 hours. He needs to come to the house every day and check on their safety because of a number of issues dried help drives him to appointments due to financial help in any other emergency that arrives and neighbors who might be concerned about this couple will call him at work to come and check on them.  This has been a problem with his work schedule and an Event organiser form is required.  He is a Freight forwarder at [employer]. He has the financial power of attorney sister is healthcare power of attorney.  She can prepare some foods but son helps with this.  Husband  grills but safety issues about leaving  gas grill on after use.  ROS: See pertinent positives and negatives per HPI.  Past Medical History:  Diagnosis Date   Arthritis    Cataract    lens implant   Closed pelvic fracture ([place]) 2012   pelvis and shoulder    Colon polyps    Hx of fall    fracture pelvis and left shoulder humerus   Hx of nonmelanoma  skin cancer    ssca   Hx of syncope    had neg event monitor per dr Marya Amsler taylor in 2010   Hypercalciuria    pu on hctz per record   Kidney stones 1967   MVP (mitral valve prolapse)    in records  no echo eval in records   Osteoporosis    eval dr Buddy Duty in past dexa 11 12     Past Surgical History:  Procedure Laterality Date   CATARACT EXTRACTION     left   [place]  2017   SHOULDER SURGERY  11/2010    Family History  Problem Relation Age of Onset   CVA Mother        Died in her 68T complications rheumatic  fever afterwards when she got a stroke   Rheumatic fever Mother    Lung cancer Sister        Died in 60s was a smoker   Thyroid cancer Daughter        Papillary    Social History   Tobacco Use   Smoking status: Never   Smokeless tobacco: Never  Vaping Use   Vaping Use: Never used  Substance Use Topics   Alcohol use: Yes    Alcohol/week: 3.0 standard drinks of alcohol    Types: 3 Standard  drinks or equivalent per week   Drug use: No      Current Outpatient Medications:    amitriptyline (ELAVIL) 10 MG tablet, Take 1 tablet (10 mg total) by mouth at bedtime., Disp: 90 tablet, Rfl: 2   Calcium Carbonate-Vitamin D (CALCIUM-VITAMIN D) 500-200 MG-UNIT per tablet, Take 1 tablet by mouth 2 (two) times daily with a meal. Reported on 09/27/2015, Disp: , Rfl:    clonazePAM (KLONOPIN) 0.5 MG tablet, TAKE 1/2 (ONE-HALF) TABLET BY MOUTH AT BEDTIME AS NEEDED, Disp: 45 tablet, Rfl: 0   methocarbamol (ROBAXIN) 500 MG tablet, , Disp: , Rfl:    Multiple Vitamins-Minerals (MULTIVITAL) tablet, Take 1 tablet by mouth daily. Reported on 09/27/2015, Disp: , Rfl:    mupirocin ointment (BACTROBAN) 2 %, , Disp: , Rfl:    FLUZONE HIGH-DOSE QUADRIVALENT 0.7 ML SUSY, , Disp: , Rfl:    ofloxacin (OCUFLOX) 0.3 % ophthalmic solution, Place 1 drop into the left eye 4 (four) times daily., Disp: , Rfl:    tretinoin (RETIN-A) 0.025 % cream,  Reported on 09/27/2015, Disp: , Rfl:   EXAM: BP Readings from Last 3 Encounters:  07/15/21 120/70  02/27/21 (!) 163/87  01/14/21 126/74    VITALS per patient if applicable:  GENERAL: alert, oriented, appears well and in no acute distress  HEENT: atraumatic, conjunttiva clear, no obvious abnormalities on inspection of external nose and ears   PSYCH/NEURO: pleasant and cooperative, no obvious depression or anxiety, speech and thought processing grossly intact   ASSESSMENT AND PLAN:  Discussed the following assessment and plan:    ICD-10-CM   1. Osteoporosis, unspecified osteoporosis type, unspecified pathological fracture presence  M81.0     2. Decreased hearing, unspecified laterality  H91.90     3. Care provider for parents  Z63.6     4. High risk medication use  Z79.899      Will complete form and  Counseled.  Options for safely at home and family care   Expectant management and discussion of plan and treatment with opportunity to ask questions and all were answered. The patient agreed with the plan and demonstrated an understanding of the instructions.   Advised to call back or seek an in-person evaluation if worsening  or having  further concerns  in interim. Return for when planned.    Shanon Ace, MD

## 2021-11-03 ENCOUNTER — Telehealth: Payer: Self-pay | Admitting: Internal Medicine

## 2021-11-03 NOTE — Telephone Encounter (Signed)
Left message for patient to call back and schedule Medicare Annual Wellness Visit (AWV) either virtually or in office. Left  my Herbie Drape number [phone]   Last AWV 05/28/20 ; please schedule at anytime with [facility] Nurse Health Advisor 1 or 2

## 2021-11-04 DIAGNOSIS — H532 Diplopia: Secondary | ICD-10-CM | POA: Diagnosis not present

## 2021-11-04 DIAGNOSIS — H5000 Unspecified esotropia: Secondary | ICD-10-CM | POA: Diagnosis not present

## 2021-11-04 DIAGNOSIS — Z961 Presence of intraocular lens: Secondary | ICD-10-CM | POA: Diagnosis not present

## 2021-11-04 DIAGNOSIS — H04123 Dry eye syndrome of bilateral lacrimal glands: Secondary | ICD-10-CM | POA: Diagnosis not present

## 2021-11-24 ENCOUNTER — Telehealth: Payer: Self-pay | Admitting: Internal Medicine

## 2021-11-24 NOTE — Telephone Encounter (Signed)
Tried calling patient to schedule Medicare Annual Wellness Visit (AWV) either virtually or in office.   No answer  Last AWV 05/28/20 ; please schedule at anytime with [facility] Nurse Health Advisor 1 or 2

## 2021-12-22 ENCOUNTER — Other Ambulatory Visit: Payer: Self-pay | Admitting: Internal Medicine

## 2021-12-24 ENCOUNTER — Ambulatory Visit (INDEPENDENT_AMBULATORY_CARE_PROVIDER_SITE_OTHER): Payer: Medicare HMO | Admitting: *Deleted

## 2021-12-24 DIAGNOSIS — Z23 Encounter for immunization: Secondary | ICD-10-CM

## 2021-12-24 NOTE — Telephone Encounter (Signed)
Pt's daughter Nunzio Cory) called to ask MD if she could please send the authorization to the pharmacy for the refill of the: clonazePAM (KLONOPIN) 0.5 MG tablet  Pt is completely out.  [place], [place] - [address] Phone:  [phone]  Fax:  [phone]    LOV:  07/15/21  Pt has an appt today to get a flu shot, but Pt's son can bring her in for an appt, if MD requests a visit.  Please advise.

## 2022-01-30 DIAGNOSIS — B029 Zoster without complications: Secondary | ICD-10-CM | POA: Diagnosis not present

## 2022-03-30 ENCOUNTER — Other Ambulatory Visit: Payer: Self-pay | Admitting: Internal Medicine

## 2022-03-30 ENCOUNTER — Telehealth: Payer: Self-pay | Admitting: Internal Medicine

## 2022-03-30 MED ORDER — AMITRIPTYLINE HCL 10 MG PO TABS
10.0000 mg | ORAL_TABLET | Freq: Every day | ORAL | 0 refills | Status: DC
Start: 2022-03-30 — End: 2022-07-16

## 2022-03-30 MED ORDER — CLONAZEPAM 0.5 MG PO TABS: ORAL_TABLET | ORAL | 0 refills | Status: DC

## 2022-03-30 NOTE — Telephone Encounter (Signed)
Pt called to request a 90 day refill of the following:  amitriptyline (ELAVIL) 10 MG tablet clonazePAM (KLONOPIN) 0.5 MG tablet    LOV:  07/15/21  Pt is aware MD is OOO, expecting to return early next month. Pt stated she would call then to schedule an OV.  Please advise.  [place], [place] - [address] Phone: [phone]  Fax: [phone]

## 2022-03-30 NOTE — Telephone Encounter (Signed)
Dr. Velora Mediate patient.    Can you please assist with this refill request?  Last VV-10/23/21  Last refill-Clonazepam--12/23/21--45 tabs, 0 refill Last refill- Elavil--07/15/21-90 tabs, 2 refills  No future OV scheduled.

## 2022-03-30 NOTE — Telephone Encounter (Signed)
Patient calling to check on progress of this refill 

## 2022-04-29 ENCOUNTER — Encounter: Payer: Self-pay | Admitting: Internal Medicine

## 2022-04-29 ENCOUNTER — Ambulatory Visit (INDEPENDENT_AMBULATORY_CARE_PROVIDER_SITE_OTHER): Payer: Medicare HMO | Admitting: Internal Medicine

## 2022-04-29 VITALS — BP 138/74 | HR 71 | Temp 97.7°F | Ht 63.0 in | Wt 116.4 lb

## 2022-04-29 DIAGNOSIS — G4700 Insomnia, unspecified: Secondary | ICD-10-CM

## 2022-04-29 DIAGNOSIS — Z974 Presence of external hearing-aid: Secondary | ICD-10-CM | POA: Diagnosis not present

## 2022-04-29 DIAGNOSIS — M810 Age-related osteoporosis without current pathological fracture: Secondary | ICD-10-CM

## 2022-04-29 DIAGNOSIS — E559 Vitamin D deficiency, unspecified: Secondary | ICD-10-CM

## 2022-04-29 DIAGNOSIS — Z79899 Other long term (current) drug therapy: Secondary | ICD-10-CM

## 2022-04-29 LAB — VITAMIN D 25 HYDROXY (VIT D DEFICIENCY, FRACTURES): VITD: 34.07 ng/mL (ref 30.00–100.00)

## 2022-04-29 NOTE — Patient Instructions (Addendum)
Good to see you today   Will continue same meds since benefit seems more than risk. Continue resistance  training as possible  Plan  updated lab  cbc and chemistry.   Take 600 mg calcium twice a day and vit d3 1000-2000 iu equivalent   in various forms .

## 2022-04-29 NOTE — Progress Notes (Unsigned)
Chief Complaint  Patient presents with   Medical Management of Chronic Issues    Follow up for osteoporosis    HPI: Jacqueline Burgess 82 y.o. come in for Chronic disease management  Hcx of osteopososis declined rx meds    had se of prolia  rash   Meds for sleep anxiety for years  low dose clonipen and eleavil?  No recnet falls   Temproary meloxicam   7.5  Walks in am hours.  Vision hearing .  Remote hx of  covid over a year ago .  Had  shingles  after rsv shot.    ROS: See pertinent positives and negatives per HPI. No cv sx   Past Medical History:  Diagnosis Date   Arthritis    Cataract    lens implant   Closed pelvic fracture ([facility]) 2012   pelvis and shoulder    Colon polyps    Hx of fall    fracture pelvis and left shoulder humerus   Hx of nonmelanoma skin cancer    ssca   Hx of syncope    had neg event monitor per dr Marya Amsler taylor in 2010   Hypercalciuria    pu on hctz per record   Kidney stones 1967   MVP (mitral valve prolapse)    in records  no echo eval in records   Osteoporosis    eval dr Buddy Duty in past dexa 11 12     Family History  Problem Relation Age of Onset   CVA Mother        Died in her [id number] complications rheumatic  fever afterwards when she got a stroke   Rheumatic fever Mother    Lung cancer Sister        Died in 46s was a smoker   Thyroid cancer Daughter        Papillary    Social History   Socioeconomic History   Marital status: Married    Spouse name: Not on file   Number of children: Not on file   Years of education: Not on file   Highest education level: Not on file  Occupational History   Not on file  Tobacco Use   Smoking status: Never   Smokeless tobacco: Never  Vaping Use   Vaping Use: Never used  Substance and Sexual Activity   Alcohol use: Yes    Alcohol/week: 3.0 standard drinks of alcohol    Types: 3 Standard drinks or equivalent per week   Drug use: No   Sexual activity: Yes    Partners: Female  Other Topics  Concern   Not on file  Social History Narrative   Married retired Pharmacist, 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   g4 P4   Regular exercise: walks daily   Caffeine use: 2 cups of coffee daily      Social Determinants of Health   Financial Resource Strain: Low Risk  (05/28/2020)   Overall Financial Resource Strain (CARDIA)    Difficulty of Paying Living Expenses: Not hard at all  Food Insecurity: No Food Insecurity (05/28/2020)   Hunger Vital Sign    Worried About Running Out of Food in the Last Year: Never true    Ran Out of Food in the Last Year: Never true  Transportation Needs: No Transportation Needs (05/28/2020)  PRAPARE - Hydrologist (Medical): No    Lack of Transportation (Non-Medical): No  Physical Activity: Sufficiently Active (05/28/2020)   Exercise Vital Sign    Days of Exercise per Week: 7 days    Minutes of Exercise per Session: 60 min  Stress: No Stress Concern Present (05/28/2020)   [place]    Feeling of Stress : Not at all  Social Connections: [place] (05/28/2020)   Social Connection and Isolation Panel [NHANES]    Frequency of Communication with Friends and Family: Once a week    Frequency of Social Gatherings with Friends and Family: Three times a week    Attends Religious Services: More than 4 times per year    Active Member of Clubs or Organizations: Yes    Attends Archivist Meetings: 1 to 4 times per year    Marital Status: Married    Outpatient Medications Prior to Visit  Medication Sig Dispense Refill   amitriptyline (ELAVIL) 10 MG tablet Take 1 tablet (10 mg total) by mouth at bedtime. 90 tablet 0   Calcium Carbonate-Vitamin D (CALCIUM-VITAMIN D) 500-200 MG-UNIT per tablet Take 1 tablet by mouth  2 (two) times daily with a meal. Reported on 09/27/2015     clonazePAM (KLONOPIN) 0.5 MG tablet TAKE 1/2 TABLET BY MOUTH AT BEDTIME AS NEEDED make appt before next refill needed 45 tablet 0   Multiple Vitamins-Minerals (MULTIVITAL) tablet Take 1 tablet by mouth daily. Reported on 09/27/2015     mupirocin ointment (BACTROBAN) 2 %      ofloxacin (OCUFLOX) 0.3 % ophthalmic solution Place 1 drop into the left eye 4 (four) times daily.     tretinoin (RETIN-A) 0.025 % cream Reported on 09/27/2015     FLUZONE HIGH-DOSE QUADRIVALENT 0.7 ML SUSY  (Patient not taking: Reported on 10/23/2021)     methocarbamol (ROBAXIN) 500 MG tablet  (Patient not taking: Reported on 04/29/2022)     No facility-administered medications prior to visit.     EXAM:  BP 138/74 (BP Location: Right Arm, Patient Position: Sitting, Cuff Size: Normal)   Pulse 71   Temp 97.7 F (36.5 C) (Oral)   Ht 5' 3"$  (1.6 m)   Wt 116 lb 6.4 oz (52.8 kg)   SpO2 97%   BMI 20.62 kg/m   Body mass index is 20.62 kg/m.  GENERAL: vitals reviewed and listed above, alert, oriented, appears well hydrated and in no acute distress HEENT: atraumatic, conjunctiva  clear, no obvious abnormalities on inspection of external nose and ears OP : no lesion edema or exudate  NECK: no obvious masses on inspection palpation  LUNGS: clear to auscultation bilaterally, no wheezes, rales or rhonchi, good air movement CV: HRRR, no clubbing cyanosis or  peripheral edema nl cap refill  MS: moves all extremities without noticeable focal  abnormality PSYCH: pleasant and cooperative, no obvious depression or anxiety Lab Results  Component Value Date   WBC 7.3 07/15/2021   HGB 13.8 07/15/2021   HCT 41.7 07/15/2021   PLT 195.0 07/15/2021   GLUCOSE 91 07/15/2021   CHOL 211 (H) 07/31/2016   TRIG 83.0 07/31/2016   HDL 68.40 07/31/2016   LDLDIRECT 136.8 03/14/2013   LDLCALC 126 (H) 07/31/2016   ALT 19 02/26/2012   AST 24 02/26/2012   NA 134 (L) 07/15/2021   K  4.3 07/15/2021   CL 98 07/15/2021   CREATININE 0.72 07/15/2021   BUN 20 07/15/2021  CO2 28 07/15/2021   TSH 0.90 07/15/2021   INR 0.98 11/11/2010   BP Readings from Last 3 Encounters:  04/29/22 138/74  07/15/21 120/70  02/27/21 (!) 163/87    ASSESSMENT AND PLAN:  Discussed the following assessment and plan:  Osteoporosis, unspecified osteoporosis type, unspecified pathological fracture presence  Insomnia, unspecified type  Medication management  Wears hearing aid  -Patient advised to return or notify health care team  if  new concerns arise.  There are no Patient Instructions on file for this visit.   Standley Brooking. Yuvraj Pfeifer M.D.

## 2022-04-30 LAB — BASIC METABOLIC PANEL
BUN: 29 mg/dL — ABNORMAL HIGH (ref 6–23)
CO2: 27 mEq/L (ref 19–32)
Calcium: 9.6 mg/dL (ref 8.4–10.5)
Chloride: 101 mEq/L (ref 96–112)
Creatinine, Ser: 0.79 mg/dL (ref 0.40–1.20)
GFR: 69.1 mL/min (ref >60.00)
Glucose, Bld: 81 mg/dL (ref 70–99)
Potassium: 4 mEq/L (ref 3.5–5.1)
Sodium: 139 mEq/L (ref 135–145)

## 2022-04-30 NOTE — Progress Notes (Signed)
Vit d level low normal      vit d supplementation as discussed  to continue

## 2022-06-26 ENCOUNTER — Telehealth: Payer: Self-pay

## 2022-06-26 NOTE — Telephone Encounter (Signed)
Received a fax from [facility] stating they tried to reach pt to schedule a bone density exam but her phone is disconnected.   Contact pt, daughter answered. Daughter, Nicholos Johns (on [place]), states she will contact [facility] to schedule appt.  Contacted [facility] and spoke to [place]. Inform her above message. Verbalized understanding.

## 2022-07-01 ENCOUNTER — Other Ambulatory Visit: Payer: Self-pay | Admitting: Family Medicine

## 2022-07-08 ENCOUNTER — Telehealth: Payer: Self-pay | Admitting: Internal Medicine

## 2022-07-08 NOTE — Telephone Encounter (Signed)
Called patient to schedule Medicare Annual Wellness Visit (AWV). Left message for patient to call back and schedule Medicare Annual Wellness Visit (AWV).  Last date of AWV: 05/28/20  Please schedule an appointment at any time with [facility] or [employer] [facility] [employer].  If any questions, please contact me at [phone].  Thank you ,  Rudell Cobb AWV direct phone # [phone]

## 2022-07-16 ENCOUNTER — Telehealth: Payer: Self-pay | Admitting: Internal Medicine

## 2022-07-16 ENCOUNTER — Other Ambulatory Visit: Payer: Self-pay | Admitting: Family

## 2022-07-16 MED ORDER — AMITRIPTYLINE HCL 10 MG PO TABS
10.0000 mg | ORAL_TABLET | Freq: Every day | ORAL | 0 refills | Status: DC
Start: 2022-07-16 — End: 2022-07-17

## 2022-07-16 NOTE — Telephone Encounter (Signed)
Prescription Request  07/16/2022  LOV: 04/29/2022 **also wants to know if patient is due for a f/u  What is the name of the medication or equipment? amitriptyline (ELAVIL) 10 MG tablet  Have you contacted your pharmacy to request a refill? No   Which pharmacy would you like this sent to?    [facility] [address] [place] - [address] [place] [address] Phone: [phone] Fax: [phone]    Patient notified that their request is being sent to the clinical staff for review and that they should receive a response within 2 business days.   Please advise at Mobile [phone] (mobile)

## 2022-07-17 MED ORDER — AMITRIPTYLINE HCL 10 MG PO TABS: 10.0000 mg | ORAL_TABLET | Freq: Every day | ORAL | 0 refills | Status: DC

## 2022-07-17 NOTE — Telephone Encounter (Signed)
Spoke to daughter, Nadara Mustard). Inform her the Rx was sent to [facility] pharmacy yesterday. Daughter preferred [facility].   Rx resent to [facility]. Schedule a physical appt for pt as she is due for one.

## 2022-07-17 NOTE — Addendum Note (Signed)
Addended byVickii Chafe on: 07/17/2022 01:50 PM   Modules accepted: Orders

## 2022-08-06 ENCOUNTER — Encounter: Payer: Self-pay | Admitting: Internal Medicine

## 2022-08-06 ENCOUNTER — Ambulatory Visit (INDEPENDENT_AMBULATORY_CARE_PROVIDER_SITE_OTHER): Payer: Medicare HMO | Admitting: Internal Medicine

## 2022-08-06 VITALS — BP 150/90 | HR 73 | Temp 97.4°F | Ht 62.5 in | Wt 109.8 lb

## 2022-08-06 DIAGNOSIS — R030 Elevated blood-pressure reading, without diagnosis of hypertension: Secondary | ICD-10-CM

## 2022-08-06 DIAGNOSIS — H9193 Unspecified hearing loss, bilateral: Secondary | ICD-10-CM

## 2022-08-06 DIAGNOSIS — E559 Vitamin D deficiency, unspecified: Secondary | ICD-10-CM

## 2022-08-06 DIAGNOSIS — H9190 Unspecified hearing loss, unspecified ear: Secondary | ICD-10-CM

## 2022-08-06 DIAGNOSIS — R413 Other amnesia: Secondary | ICD-10-CM | POA: Diagnosis not present

## 2022-08-06 DIAGNOSIS — G4700 Insomnia, unspecified: Secondary | ICD-10-CM | POA: Diagnosis not present

## 2022-08-06 DIAGNOSIS — M810 Age-related osteoporosis without current pathological fracture: Secondary | ICD-10-CM

## 2022-08-06 DIAGNOSIS — Z79899 Other long term (current) drug therapy: Secondary | ICD-10-CM | POA: Diagnosis not present

## 2022-08-06 DIAGNOSIS — Z0000 Encounter for general adult medical examination without abnormal findings: Secondary | ICD-10-CM

## 2022-08-06 LAB — COMPREHENSIVE METABOLIC PANEL
ALT: 17 U/L (ref 0–35)
AST: 24 U/L (ref 0–37)
Albumin: 4.5 g/dL (ref 3.5–5.2)
Alkaline Phosphatase: 59 U/L (ref 39–117)
BUN: 22 mg/dL (ref 6–23)
CO2: 30 mEq/L (ref 19–32)
Calcium: 9.3 mg/dL (ref 8.4–10.5)
Chloride: 99 mEq/L (ref 96–112)
Creatinine, Ser: 0.82 mg/dL (ref 0.40–1.20)
GFR: 65.95 mL/min (ref >60.00)
Glucose, Bld: 89 mg/dL (ref 70–99)
Potassium: 4.3 mEq/L (ref 3.5–5.1)
Sodium: 137 mEq/L (ref 135–145)
Total Bilirubin: 0.4 mg/dL (ref 0.2–1.2)
Total Protein: 7.1 g/dL (ref 6.0–8.3)

## 2022-08-06 LAB — CBC WITH DIFFERENTIAL/PLATELET
Basophils Absolute: 0 10*3/uL (ref 0.0–0.1)
Basophils Relative: 0.4 % (ref 0.0–3.0)
Eosinophils Absolute: 0.1 10*3/uL (ref 0.0–0.7)
Eosinophils Relative: 1.3 % (ref 0.0–5.0)
HCT: 43.3 % (ref 36.0–46.0)
Hemoglobin: 14.1 g/dL (ref 12.0–15.0)
Lymphocytes Relative: 25.3 % (ref 12.0–46.0)
Lymphs Abs: 1.7 10*3/uL (ref 0.7–4.0)
MCHC: 32.7 g/dL (ref 30.0–36.0)
MCV: 93.7 fl (ref 78.0–100.0)
Monocytes Absolute: 0.7 10*3/uL (ref 0.1–1.0)
Monocytes Relative: 10.3 % (ref 3.0–12.0)
Neutro Abs: 4.3 10*3/uL (ref 1.4–7.7)
Neutrophils Relative %: 62.7 % (ref 43.0–77.0)
Platelets: 214 10*3/uL (ref 150.0–400.0)
RBC: 4.62 Mil/uL (ref 3.87–5.11)
RDW: 13.7 % (ref 11.5–15.5)
WBC: 6.9 10*3/uL (ref 4.0–10.5)

## 2022-08-06 LAB — LIPID PANEL
Cholesterol: 202 mg/dL — ABNORMAL HIGH (ref 0–200)
HDL: 64.7 mg/dL (ref >39.00)
LDL Cholesterol: 122 mg/dL — ABNORMAL HIGH (ref 0–99)
NonHDL: 137.7
Total CHOL/HDL Ratio: 3
Triglycerides: 81 mg/dL (ref 0.0–149.0)
VLDL: 16.2 mg/dL (ref 0.0–40.0)

## 2022-08-06 LAB — TSH: TSH: 0.79 u[IU]/mL (ref 0.35–5.50)

## 2022-08-06 LAB — VITAMIN D 25 HYDROXY (VIT D DEFICIENCY, FRACTURES): VITD: 38.81 ng/mL (ref 30.00–100.00)

## 2022-08-06 MED ORDER — CLONAZEPAM 0.5 MG PO TABS: ORAL_TABLET | ORAL | 0 refills | Status: DC

## 2022-08-06 NOTE — Progress Notes (Signed)
Chief Complaint  Patient presents with   Annual Exam    HPI: Patient  Jacqueline Burgess  82 y.o. comes in today for Preventive Health Care visit  Needs new glasses  decided on no hearing  aids cause of cost.  6 mos ago . Walking   doing rosary  an hours.   And back stretch  Still wearing splint r wrist  radial fx over a years ago   takes off for shower   not a lot of swelling or pain  last check was w extender  and told to cont the splint    and no instructions. Hearing  aids last check too expensive can't remember where she went Health Maintenance  Topic Date Due   Colonoscopy  07/07/2015   Medicare Annual Wellness (AWV)  05/28/2021   COVID-19 Vaccine (4 - 2023-24 season) 08/22/2022 (Originally 11/07/2021)   Zoster Vaccines- Shingrix (1 of 2) 11/06/2022 (Originally 10/02/1959)   INFLUENZA VACCINE  10/08/2022   DTaP/Tdap/Td (2 - Tdap) 03/13/2024   Pneumonia Vaccine 41+ Years old  Completed   DEXA SCAN  Completed   HPV VACCINES  Aged Out   Health Maintenance Review LIFESTYLE:  Exercise:   walks almost hour and strech back  saying rosary  Tobacco/ETS:n Alcohol: n Sugar beverages:n Sleep: with meds  Drug use: no HH of  2  no longer driving both she and husb hard of hearing  independent   See skin derm  ocass has lit s or moles and sun changes    ROS: reports memery about the same  GEN/ HEENT: No fever, significant weight changes sweats headaches vision problems hearing changes, CV/ PULM; No chest pain shortness of breath cough, syncope,edema  change in exercise tolerance. GI /GU: No adominal pain, vomiting, change in bowel habits. No blood in the stool. No significant GU symptoms. SKIN/HEME: ,no acute skin rashes suspicious lesions or bleeding. No lymphadenopathy, nodules, masses.  NEURO/ PSYCH:  No neurologic signs such as weakness numbness. No depression anxiety. IMM/ Allergy: No unusual infections.  Allergy .   REST of 12 system review negative except as per HPI   Past  Medical History:  Diagnosis Date   Arthritis    Cataract    lens implant   Closed pelvic fracture ([facility]) 2012   pelvis and shoulder    Colon polyps    Hx of fall    fracture pelvis and left shoulder humerus   Hx of nonmelanoma skin cancer    ssca   Hx of syncope    had neg event monitor per dr Tammy Sours taylor in 2010   Hypercalciuria    pu on hctz per record   Kidney stones 1967   MVP (mitral valve prolapse)    in records  no echo eval in records   Osteoporosis    eval dr Sharl Ma in past dexa 11 12     Past Surgical History:  Procedure Laterality Date   CATARACT EXTRACTION     left   CHOLECYSTECTOMY  1999   KIDNEY STONE SURGERY  1967   MOHS SURGERY  2017   SHOULDER SURGERY  11/2010    Family History  Problem Relation Age of Onset   CVA Mother        Died in her 75s complications rheumatic  fever afterwards when she got a stroke   Rheumatic fever Mother    Lung cancer Sister        Died in 45s was a smoker  Thyroid cancer Daughter        Papillary    Social History   Socioeconomic History   Marital status: Married    Spouse name: Not on file   Number of children: Not on file   Years of education: Not on file   Highest education level: Not on file  Occupational History   Not on file  Tobacco Use   Smoking status: Never   Smokeless tobacco: Never  Vaping Use   Vaping Use: Never used  Substance and Sexual Activity   Alcohol use: Yes    Alcohol/week: 3.0 standard drinks of alcohol    Types: 3 Standard drinks or equivalent per week   Drug use: No   Sexual activity: Yes    Partners: Female  Other Topics Concern   Not on file  Social History Narrative   Married retired Runner, 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   g4 P4   Regular exercise: walks daily   Caffeine use: 2 cups of coffee daily      Social Determinants  of Health   Financial Resource Strain: Low Risk  (05/28/2020)   Overall Financial Resource Strain (CARDIA)    Difficulty of Paying Living Expenses: Not hard at all  Food Insecurity: No Food Insecurity (05/28/2020)   Hunger Vital Sign    Worried About Running Out of Food in the Last Year: Never true    Ran Out of Food in the Last Year: Never true  Transportation Needs: No Transportation Needs (05/28/2020)   PRAPARE - Administrator, Civil Service (Medical): No    Lack of Transportation (Non-Medical): No  Physical Activity: Sufficiently Active (05/28/2020)   Exercise Vital Sign    Days of Exercise per Week: 7 days    Minutes of Exercise per Session: 60 min  Stress: No Stress Concern Present (05/28/2020)   [employer] of Occupational Health - Occupational Stress Questionnaire    Feeling of Stress : Not at all  Social Connections: Socially Integrated (05/28/2020)   Social Connection and Isolation Panel [NHANES]    Frequency of Communication with Friends and Family: Once a week    Frequency of Social Gatherings with Friends and Family: Three times a week    Attends Religious Services: More than 4 times per year    Active Member of Clubs or Organizations: Yes    Attends Banker Meetings: 1 to 4 times per year    Marital Status: Married    Outpatient Medications Prior to Visit  Medication Sig Dispense Refill   amitriptyline (ELAVIL) 10 MG tablet Take 1 tablet (10 mg total) by mouth at bedtime. 90 tablet 0   Calcium Carbonate-Vitamin D (CALCIUM-VITAMIN D) 500-200 MG-UNIT per tablet Take 1 tablet by mouth 2 (two) times daily with a meal. Reported on 09/27/2015     Multiple Vitamins-Minerals (MULTIVITAL) tablet Take 1 tablet by mouth daily. Reported on 09/27/2015     tretinoin (RETIN-A) 0.025 % cream Reported on 09/27/2015     clonazePAM (KLONOPIN) 0.5 MG tablet TAKE 1/2 (ONE-HALF) TABLET BY MOUTH AT BEDTIME AS NEEDED . APPOINTMENT REQUIRED FOR FUTURE REFILLS 45 tablet  0   FLUZONE HIGH-DOSE QUADRIVALENT 0.7 ML SUSY  (Patient not taking: Reported on 10/23/2021)     methocarbamol (ROBAXIN) 500 MG tablet  (Patient not taking: Reported on 04/29/2022)  mupirocin ointment (BACTROBAN) 2 %  (Patient not taking: Reported on 08/06/2022)     ofloxacin (OCUFLOX) 0.3 % ophthalmic solution Place 1 drop into the left eye 4 (four) times daily. (Patient not taking: Reported on 08/06/2022)     No facility-administered medications prior to visit.     EXAM:  BP (!) 150/90 (BP Location: Left Arm, Cuff Size: Normal)   Pulse 73   Temp (!) 97.4 F (36.3 C) (Oral)   Ht 5' 2.5" (1.588 m)   Wt 109 lb 12.8 oz (49.8 kg)   SpO2 96%   BMI 19.76 kg/m   Body mass index is 19.76 kg/m. Wt Readings from Last 3 Encounters:  08/06/22 109 lb 12.8 oz (49.8 kg)  04/29/22 116 lb 6.4 oz (52.8 kg)  10/23/21 115 lb (52.2 kg)    Physical Exam: Vital signs reviewed ZOX:WRUE is a well-developed well-nourished alert cooperative    who appearsr stated age in no acute distress.  HEENT: normocephalic atraumatic , Eyes: PERRL EOM's full, conjunctiva clear, Nares: paten,t no deformity discharge or tenderness., Ears: no deformity EAC's clear TMs with normal landmarks. Mouth: clear OP, no lesions, edema.  Moist mucous membranes. Dentition in adequate repair. NECK: supple without masses, thyromegaly or bruits. CHEST/PULM:  Clear to auscultation and percussion breath sounds equal no wheeze , rales or rhonchi. No chest wall deformities or tenderness. Breast: normal by inspection . No dimpling, discharge, masses, tenderness or discharge . CV: PMI is nondisplaced, S1 S2 no gallops, murmurs, rubs. Peripheral pulses are full without delay.No JVD .  ABDOMEN: Bowel sounds normal nontender  No guard or rebound, no hepato splenomegal no CVA tenderness.  No hernia. Extremtities:  No clubbing cyanosis or edema, no acute joint swelling or redness no focal atrophy thickened  long toenails   NEURO:  Oriented x3,  cranial nerves 3-12 appear to be intact, no obvious focal weakness,gait within normal limits no abnormal reflexes or asymmetrical SKIN: No acute rashes normal turgor, color, no bruising or petechiae. PSYCH: Oriented, good eye contact, no obvious depression anxiety, cognition and judgment appear normal. LN: no cervical axillary inguinal adenopathy  Lab Results  Component Value Date   WBC 6.9 08/06/2022   HGB 14.1 08/06/2022   HCT 43.3 08/06/2022   PLT 214.0 08/06/2022   GLUCOSE 89 08/06/2022   CHOL 202 (H) 08/06/2022   TRIG 81.0 08/06/2022   HDL 64.70 08/06/2022   LDLDIRECT 136.8 03/14/2013   LDLCALC 122 (H) 08/06/2022   ALT 17 08/06/2022   AST 24 08/06/2022   NA 137 08/06/2022   K 4.3 08/06/2022   CL 99 08/06/2022   CREATININE 0.82 08/06/2022   BUN 22 08/06/2022   CO2 30 08/06/2022   TSH 0.79 08/06/2022   INR 0.98 11/11/2010    BP Readings from Last 3 Encounters:  08/06/22 (!) 150/90  04/29/22 138/74  07/15/21 120/70    Lab plan reviewed with patient   ASSESSMENT AND PLAN:  Discussed the following assessment and plan:    ICD-10-CM   1. Visit for preventive health examination  Z00.00     2. Osteoporosis, unspecified osteoporosis type, unspecified pathological fracture presence  M81.0 CBC with Differential/Platelet    Comprehensive metabolic panel    Lipid panel    TSH    Vitamin D, 25-hydroxy    3. Medication management  Z79.899 CBC with Differential/Platelet    Comprehensive metabolic panel    Lipid panel    TSH    Vitamin D, 25-hydroxy    4.  Vitamin D deficiency  E55.9 CBC with Differential/Platelet    Comprehensive metabolic panel    Lipid panel    TSH    Vitamin D, 25-hydroxy    5. Decreased hearing, unspecified laterality  H91.90 CBC with Differential/Platelet    Comprehensive metabolic panel    Lipid panel    TSH    6. Insomnia, unspecified type  G47.00 CBC with Differential/Platelet    Comprehensive metabolic panel    Lipid panel    TSH     Vitamin D, 25-hydroxy   refilled klon had been on for years and in past tried off  takes low dose tca plus    7. 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 Mild memory disturbance  R41.3    reported  same  hearing is interfering    9. Elevated blood pressure reading  R03.0    no hx of same  check at home    Risk benefit of meds  long term low dose . Memory not formally tested  feels about the same  Try off the splint and maybe get a soft support or only use when physical need   Disc hearing   maybe uncg check  limiting   Return in about 3 months (around 11/06/2022) for medication, depending on results and bp  levels.  Patient Care Team: Shakendra Griffeth, Neta Mends, MD as PCP - General (Internal Medicine) Huel Cote, MD as Attending Physician (Obstetrics and Gynecology) Teryl Lucy, MD as Attending Physician (Orthopedic Surgery) Ernesto Rutherford, MD (Ophthalmology) Venancio Poisson, MD as Attending Physician (Dermatology) Hilarie Fredrickson, MD as Attending Physician (Gastroenterology) Patient Instructions  Good to see  you today . I agree taking splint off for rest but can use for activity . Maybe a soft support will  be helpful   Don't" have enough information for other advice .  But would do one more visit with ortho to get ?s answered . See  dermatology routine .  Check   Will refill medication today klonipin today    Consider  fu evaluation  at [facility] speech and hearing  department  Also can see podiatry for nail help.  Updated dexa scan  . Location of choice .   Lab today   Your bp was up today   check readings at home and send readings in to my chart or call in . Goal is below 140/90 average.    Neta Mends. Vernal Rutan M.D.

## 2022-08-06 NOTE — Patient Instructions (Addendum)
Good to see  you today . I agree taking splint off for rest but can use for activity . Maybe a soft support will  be helpful   Don't" have enough information for other advice .  But would do one more visit with ortho to get ?s answered . See  dermatology routine .  Check   Will refill medication today klonipin today    Consider  fu evaluation  at [facility] speech and hearing  department  Also can see podiatry for nail help.  Updated dexa scan  . Location of choice .   Lab today   Your bp was up today   check readings at home and send readings in to my chart or call in . Goal is below 140/90 average.

## 2022-09-04 ENCOUNTER — Telehealth: Payer: Self-pay | Admitting: Internal Medicine

## 2022-09-04 NOTE — Telephone Encounter (Signed)
Patient dropped off document  Medical Leave of Absense , to be filled out by provider. Patient requested to send it back via Fax, pt would also like [place] to call him when finished and he will pick up original copy within 5-days. Document is located in providers tray at front office.Please advise at Mobile [phone] (mobile)

## 2022-09-09 ENCOUNTER — Telehealth (INDEPENDENT_AMBULATORY_CARE_PROVIDER_SITE_OTHER): Payer: Medicare HMO | Admitting: Internal Medicine

## 2022-09-09 ENCOUNTER — Encounter: Payer: Self-pay | Admitting: Internal Medicine

## 2022-09-09 VITALS — Wt 109.0 lb

## 2022-09-09 DIAGNOSIS — Z79899 Other long term (current) drug therapy: Secondary | ICD-10-CM | POA: Diagnosis not present

## 2022-09-09 DIAGNOSIS — Z8781 Personal history of (healed) traumatic fracture: Secondary | ICD-10-CM

## 2022-09-09 DIAGNOSIS — Z636 Dependent relative needing care at home: Secondary | ICD-10-CM

## 2022-09-09 DIAGNOSIS — R413 Other amnesia: Secondary | ICD-10-CM | POA: Diagnosis not present

## 2022-09-09 NOTE — Progress Notes (Signed)
Virtual Visit via Video Note  I connected with Jacqueline Burgess on 09/09/22 at 12:15 PM EDT by a video enabled telemedicine application and verified that I am speaking with the correct person using two identifiers. Location patient: home Location provider:work office Persons participating in the virtual visit: patient, provider son Jacqueline Burgess    Patient aware  of the limitations of evaluation and management by telemedicine and  availability of in person appointments. and agreed to proceed.   HPI: Jacqueline Burgess presents for video visit with son   Fmla form  renewal as son  to help with med transport hours emergencies and medical  support and adls as indicated  Jacqueline Burgess not driving  needs to help with adls, finances  other ex toilet clog fixed and was late for work otherwise . Obtaining and preparing food by family  urgent need when neighbors or parents call .   R wrist   doing ok has new support but rigid  no problem  Son reports that was told she could take off the splint at last visit   ROS: See pertinent positives and negatives per HPI. See cpe 5 24  Past Medical History:  Diagnosis Date   Arthritis    Cataract    lens implant   Closed pelvic fracture (HCC) 2012   pelvis and shoulder    Colon polyps    Hx of fall    fracture pelvis and left shoulder humerus   Hx of nonmelanoma skin cancer    ssca   Hx of syncope    had neg event monitor per dr Tammy Sours taylor in 2010   Hypercalciuria    pu on hctz per record   Kidney stones 1967   MVP (mitral valve prolapse)    in records  no echo eval in records   Osteoporosis    eval dr Sharl Ma in past dexa 11 12     Past Surgical History:  Procedure Laterality Date   CATARACT EXTRACTION     left   CHOLECYSTECTOMY  1999   KIDNEY STONE SURGERY  1967   MOHS SURGERY  2017   SHOULDER SURGERY  11/2010    Family History  Problem Relation Age of Onset   CVA Mother        Died in her 48s complications rheumatic  fever afterwards when  she got a stroke   Rheumatic fever Mother    Lung cancer Sister        Died in 68s was a smoker   Thyroid cancer Daughter        Papillary    Social History   Tobacco Use   Smoking status: Never   Smokeless tobacco: Never  Vaping Use   Vaping Use: Never used  Substance Use Topics   Alcohol use: Yes    Alcohol/week: 3.0 standard drinks of alcohol    Types: 3 Standard drinks or equivalent per week   Drug use: No      Current Outpatient Medications:    amitriptyline (ELAVIL) 10 MG tablet, Take 1 tablet (10 mg total) by mouth at bedtime., Disp: 90 tablet, Rfl: 0   Calcium Carbonate-Vitamin D (CALCIUM-VITAMIN D) 500-200 MG-UNIT per tablet, Take 1 tablet by mouth 2 (two) times daily with a meal. Reported on 09/27/2015, Disp: , Rfl:    clonazePAM (KLONOPIN) 0.5 MG tablet, TAKE 1/2 (ONE-HALF) TABLET BY MOUTH AT BEDTIME AS NEEDED ., Disp: 45 tablet, Rfl: 0   Multiple Vitamins-Minerals (MULTIVITAL) tablet, Take 1 tablet  by mouth daily. Reported on 09/27/2015, Disp: , Rfl:    tretinoin (RETIN-A) 0.025 % cream, Reported on 09/27/2015, Disp: , Rfl:    methocarbamol (ROBAXIN) 500 MG tablet, , Disp: , Rfl:   EXAM: BP Readings from Last 3 Encounters:  08/06/22 (!) 150/90  04/29/22 138/74  07/15/21 120/70    VITALS per patient if applicable:  GENERAL: alert, appears well and in no acute distress interview and plan   HEENT: atraumatic, conjunttiva clear, no obvious abnormalities on inspection of external nose and ears  NECK: normal movements of the head and neck  LUNGS: on inspection no signs of respiratory distress, breathing rate appears normal, no obvious gross SOB, gasping or wheezing  CV: no obvious cyanosis  Jacqueline: R wrist in splint support    PSYCH/NEURO: pleasant and cooperative,    ASSESSMENT AND PLAN:  Discussed the following assessment and plan:    ICD-10-CM   1. Mild memory disturbance  R41.3     2. Medication management  Z79.899     3. son is Care provider for  parents  Z63.6     4. History of fracture of wrist  Z87.81      Continued need for  family help some unpredictable and some  scheduled ie med appts transport etc.  Will complete form and have faxed  . Son Jacqueline Burgess to get copy of form to pick up when ready.   Counseled.   Expectant management and discussion of plan and treatment with opportunity to ask questions and all were answered. The patient agreed with the plan and demonstrated an understanding of the instructions.   Advised to call back or seek an in-person evaluation if worsening  or having  further concerns  in interim. Return for when planned.    Berniece Andreas, MD

## 2022-09-09 NOTE — Telephone Encounter (Signed)
PCP completed the form and this was faxed to [facility] at [phone].  Original was left at the front desk for pick up, copy was sent to be scanned and I left a detailed message at [phone] with this information.

## 2022-09-11 ENCOUNTER — Telehealth: Payer: Self-pay | Admitting: Internal Medicine

## 2022-09-11 NOTE — Telephone Encounter (Signed)
Daughter requesting medical documentation of patient's physical/mental state so that she can be exempt from jury duty.

## 2022-09-15 ENCOUNTER — Encounter: Payer: Self-pay | Admitting: Internal Medicine

## 2022-09-15 NOTE — Progress Notes (Signed)
Lab results are stable   or in range

## 2022-09-15 NOTE — Telephone Encounter (Signed)
I spoke with patient's daughter. She has received a jury summons and she is the primary caregiver for both parents. Made daughter aware I would create letters and put them up front for her to pick up. Daughter verbalized understanding.

## 2022-09-15 NOTE — Telephone Encounter (Signed)
Need more infor about who is requesting the excuse

## 2022-09-25 ENCOUNTER — Telehealth: Payer: Self-pay

## 2022-10-22 ENCOUNTER — Encounter (INDEPENDENT_AMBULATORY_CARE_PROVIDER_SITE_OTHER): Payer: Self-pay

## 2022-11-10 ENCOUNTER — Encounter: Payer: Self-pay | Admitting: Internal Medicine

## 2022-11-10 ENCOUNTER — Ambulatory Visit (INDEPENDENT_AMBULATORY_CARE_PROVIDER_SITE_OTHER): Payer: Medicare HMO | Admitting: Internal Medicine

## 2022-11-10 VITALS — BP 124/66 | HR 84 | Temp 98.2°F | Ht 62.5 in | Wt 106.4 lb

## 2022-11-10 DIAGNOSIS — R030 Elevated blood-pressure reading, without diagnosis of hypertension: Secondary | ICD-10-CM | POA: Diagnosis not present

## 2022-11-10 DIAGNOSIS — H9193 Unspecified hearing loss, bilateral: Secondary | ICD-10-CM

## 2022-11-10 DIAGNOSIS — R413 Other amnesia: Secondary | ICD-10-CM

## 2022-11-10 DIAGNOSIS — Z79899 Other long term (current) drug therapy: Secondary | ICD-10-CM

## 2022-11-10 MED ORDER — CLONAZEPAM 0.5 MG PO TABS: ORAL_TABLET | ORAL | 0 refills | Status: DC

## 2022-11-10 MED ORDER — AMITRIPTYLINE HCL 10 MG PO TABS
10.0000 mg | ORAL_TABLET | Freq: Every day | ORAL | 0 refills | Status: DC
Start: 2022-11-10 — End: 2023-04-07

## 2022-11-10 NOTE — Progress Notes (Signed)
Chief Complaint  Patient presents with   Medical Management of Chronic Issues    HPI: Jacqueline Burgess 82 y.o. come in for Chronic disease management with son who driving her  Meds sleep and memory  BP readings  not done but has been ok  see last notes. Memory: family feelsa bout the same  supported  not driving  hering an issue but not using aids since can hear her husband at home.  Writes things down  to tell son but her stm is still down Usin glos does tca and clon for years and helps her so much as she reports .  Safety carries phone on her walks which she Ilooks forward to .  No falls  R wrist  lost the support  but uses rigind as needed when up and around no sx , hx of   bisphosphonate use  ROS: See pertinent positives and negatives per HPI.  Past Medical History:  Diagnosis Date   Arthritis    Cataract    lens implant   Closed pelvic fracture (HCC) 2012   pelvis and shoulder    Colon polyps    Hx of fall    fracture pelvis and left shoulder humerus   Hx of nonmelanoma skin cancer    ssca   Hx of syncope    had neg event monitor per dr Tammy Sours taylor in 2010   Hypercalciuria    pu on hctz per record   Kidney stones 1967   MVP (mitral valve prolapse)    in records  no echo eval in records   Osteoporosis    eval dr Sharl Ma in past dexa 11 12     Family History  Problem Relation Age of Onset   CVA Mother        Died in her 82s complications rheumatic  fever afterwards when she got a stroke   Rheumatic fever Mother    Lung cancer Sister        Died in 59s was a smoker   Thyroid cancer Daughter        Papillary    Social History   Socioeconomic History   Marital status: Married    Spouse name: Not on file   Number of children: Not on file   Years of education: Not on file   Highest education level: Not on file  Occupational History   Not on file  Tobacco Use   Smoking status: Never   Smokeless tobacco: Never  Vaping Use   Vaping status: Never Used   Substance and Sexual Activity   Alcohol use: Yes    Alcohol/week: 3.0 standard drinks of alcohol    Types: 3 Standard drinks or equivalent per week   Drug use: No   Sexual activity: Yes    Partners: Female  Other Topics Concern   Not on file  Social History Narrative   Married retired Runner, 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   g4 P4   Regular exercise: walks daily   Caffeine use: 2 cups of coffee daily      Social Determinants of Health   Financial Resource Strain: Low Risk  (05/28/2020)   Overall Financial Resource Strain (CARDIA)    Difficulty of Paying Living Expenses: Not hard at all  Food Insecurity: No Food Insecurity (05/28/2020)  Hunger Vital Sign    Worried About Running Out of Food in the Last Year: Never true    Ran Out of Food in the Last Year: Never true  Transportation Needs: No Transportation Needs (05/28/2020)   PRAPARE - Administrator, Civil Service (Medical): No    Lack of Transportation (Non-Medical): No  Physical Activity: Sufficiently Active (05/28/2020)   Exercise Vital Sign    Days of Exercise per Week: 7 days    Minutes of Exercise per Session: 60 min  Stress: No Stress Concern Present (05/28/2020)   [employer] of Occupational Health - Occupational Stress Questionnaire    Feeling of Stress : Not at all  Social Connections: Socially Integrated (05/28/2020)   Social Connection and Isolation Panel [NHANES]    Frequency of Communication with Friends and Family: Once a week    Frequency of Social Gatherings with Friends and Family: Three times a week    Attends Religious Services: More than 4 times per year    Active Member of Clubs or Organizations: Yes    Attends Banker Meetings: 1 to 4 times per year    Marital Status: Married    Outpatient Medications Prior to Visit   Medication Sig Dispense Refill   Acetaminophen (TYLENOL PO) Take by mouth as needed.     Calcium Carbonate-Vitamin D (CALCIUM-VITAMIN D) 500-200 MG-UNIT per tablet Take 1 tablet by mouth 2 (two) times daily with a meal. Reported on 09/27/2015     Multiple Vitamins-Minerals (MULTIVITAL) tablet Take 1 tablet by mouth daily. Reported on 09/27/2015     amitriptyline (ELAVIL) 10 MG tablet Take 1 tablet (10 mg total) by mouth at bedtime. 90 tablet 0   clonazePAM (KLONOPIN) 0.5 MG tablet TAKE 1/2 (ONE-HALF) TABLET BY MOUTH AT BEDTIME AS NEEDED . 45 tablet 0   tretinoin (RETIN-A) 0.025 % cream Reported on 09/27/2015 (Patient not taking: Reported on 11/10/2022)     methocarbamol (ROBAXIN) 500 MG tablet  (Patient not taking: Reported on 04/29/2022)     No facility-administered medications prior to visit.     EXAM:  BP 124/66 (BP Location: Left Arm, Patient Position: Sitting, Cuff Size: Normal)   Pulse 84   Temp 98.2 F (36.8 C) (Oral)   Ht 5' 2.5" (1.588 m)   Wt 106 lb 6.4 oz (48.3 kg)   SpO2 98%   BMI 19.15 kg/m   Body mass index is 19.15 kg/m.  GENERAL: vitals reviewed and listed above, alert,  appears well hydrated and in no acute distress decrease hearing with conversing  HEENT: atraumatic, conjunctiva  clear, no obvious abnormalities on inspection of external nose and ears NECK: no obvious masses on inspection palpation  LUNGS: clear to auscultation bilaterally, no wheezes, rales or rhonchi, good air movement CV: HRRR, no clubbing cyanosis or  peripheral edema nl cap refill  MS: moves all extremities without noticeable focal  abnormality non tender r forearm wrist  PSYCH: pleasant and cooperative, no obvious depression or anxiety Lab Results  Component Value Date   WBC 6.9 08/06/2022   HGB 14.1 08/06/2022   HCT 43.3 08/06/2022   PLT 214.0 08/06/2022   GLUCOSE 89 08/06/2022   CHOL 202 (H) 08/06/2022   TRIG 81.0 08/06/2022   HDL 64.70 08/06/2022   LDLDIRECT 136.8 03/14/2013    LDLCALC 122 (H) 08/06/2022   ALT 17 08/06/2022   AST 24 08/06/2022   NA 137 08/06/2022   K 4.3 08/06/2022   CL 99 08/06/2022  CREATININE 0.82 08/06/2022   BUN 22 08/06/2022   CO2 30 08/06/2022   TSH 0.79 08/06/2022   INR 0.98 11/11/2010   BP Readings from Last 3 Encounters:  11/10/22 124/66  08/06/22 (!) 150/90  04/29/22 138/74    ASSESSMENT AND PLAN:  Discussed the following assessment and plan:  Medication management - risk benefit med disc will continue at this time may try to dec  in future  Elevated blood pressure reading - normal today  send [place] some readings  to ensure at goal  Mild memory disturbance - about the same functioning , formal testing not done today  in safe environs  Hearing deficit, bilateral Consider  trial dec med dose  -Patient advised to return or notify health care team  if  new concerns arise.  Patient Instructions  Good to see you  Get   Bp readings  2-3 days fu with [place]  to be sure  all ok  was normal today  Try to use a soft support for wrist.   Consider uncg speech and hearing .  May be helpful for next audiology evaluation.     Neta Mends. Nykeria Mealing M.D.

## 2022-11-10 NOTE — Patient Instructions (Signed)
Good to see you  Get   Bp readings  2-3 days fu with [place]  to be sure  all ok  was normal today  Try to use a soft support for wrist.   Consider uncg speech and hearing .  May be helpful for next audiology evaluation.

## 2022-12-07 ENCOUNTER — Ambulatory Visit (INDEPENDENT_AMBULATORY_CARE_PROVIDER_SITE_OTHER): Payer: Medicare HMO

## 2022-12-07 DIAGNOSIS — Z23 Encounter for immunization: Secondary | ICD-10-CM | POA: Diagnosis not present

## 2023-02-18 ENCOUNTER — Other Ambulatory Visit: Payer: Self-pay | Admitting: Internal Medicine

## 2023-02-18 NOTE — Telephone Encounter (Signed)
Pt calling says she is out and asking if another physician could assist

## 2023-03-16 ENCOUNTER — Ambulatory Visit (INDEPENDENT_AMBULATORY_CARE_PROVIDER_SITE_OTHER): Payer: Medicare HMO | Admitting: Internal Medicine

## 2023-03-16 ENCOUNTER — Encounter: Payer: Self-pay | Admitting: Internal Medicine

## 2023-03-16 ENCOUNTER — Other Ambulatory Visit: Payer: Self-pay | Admitting: Internal Medicine

## 2023-03-16 VITALS — BP 150/80 | HR 68 | Temp 97.8°F | Ht 62.5 in | Wt 108.2 lb

## 2023-03-16 DIAGNOSIS — R413 Other amnesia: Secondary | ICD-10-CM

## 2023-03-16 DIAGNOSIS — Z79899 Other long term (current) drug therapy: Secondary | ICD-10-CM

## 2023-03-16 DIAGNOSIS — M810 Age-related osteoporosis without current pathological fracture: Secondary | ICD-10-CM | POA: Diagnosis not present

## 2023-03-16 DIAGNOSIS — R030 Elevated blood-pressure reading, without diagnosis of hypertension: Secondary | ICD-10-CM | POA: Diagnosis not present

## 2023-03-16 DIAGNOSIS — G4700 Insomnia, unspecified: Secondary | ICD-10-CM

## 2023-03-16 DIAGNOSIS — Z8781 Personal history of (healed) traumatic fracture: Secondary | ICD-10-CM

## 2023-03-16 DIAGNOSIS — H9193 Unspecified hearing loss, bilateral: Secondary | ICD-10-CM | POA: Diagnosis not present

## 2023-03-16 DIAGNOSIS — E559 Vitamin D deficiency, unspecified: Secondary | ICD-10-CM

## 2023-03-16 NOTE — Progress Notes (Signed)
 Chief Complaint  Patient presents with   Medical Management of Chronic Issues    Pt is here with son, Garrel. For 4 months f/u.     HPI: Jacqueline Burgess 83 y.o. come in for Chronic disease management  med check.  Sleep anxiety at night taking small amount clonopin 0.25 and 10 amitriptyline  for a ling time without am fogginess she has been on these meds for  years and dosages were decreased to thi level  with her reluctance  to stop. . Memory about the same  family checks  and supportive  Rollo is phone contact No fall  walks every day and in a safe situation.  Not doing water exercise now. Still had  wrist splint ( old)  the soft support  not found   according to [facility]. Continue to wear in day concern not healed  .  But can use  ok and ortho says ok to not use.  BP  had been better at home  after last check  Hx of tx for osteoporosis in past  no recent dexa   ROS: See pertinent positives and negatives per HPI.  Past Medical History:  Diagnosis Date   Arthritis    Cataract    lens implant   Closed pelvic fracture (HCC) 2012   pelvis and shoulder    Colon polyps    Hx of fall    fracture pelvis and left shoulder humerus   Hx of nonmelanoma skin cancer    ssca   Hx of syncope    had neg event monitor per dr cathlyn taylor in 2010   Hypercalciuria    pu on hctz per record   Kidney stones 1967   MVP (mitral valve prolapse)    in records  no echo eval in records   Osteoporosis    eval dr faythe in past dexa 11 12     Family History  Problem Relation Age of Onset   CVA Mother        Died in her 43s complications rheumatic  fever afterwards when she got a stroke   Rheumatic fever Mother    Lung cancer Sister        Died in 52s was a smoker   Thyroid  cancer Daughter        Papillary    Social History   Socioeconomic History   Marital status: Married    Spouse name: Not on file   Number of children: Not on file   Years of education: Not on file   Highest education  level: Not on file  Occupational History   Not on file  Tobacco Use   Smoking status: Never   Smokeless tobacco: Never  Vaping Use   Vaping status: Never Used  Substance and Sexual Activity   Alcohol use: Yes    Alcohol/week: 3.0 standard drinks of alcohol    Types: 3 Standard drinks or equivalent per week   Drug use: No   Sexual activity: Yes    Partners: Female  Other Topics Concern   Not on file  Social History Narrative   Married retired runner, 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   g4 P4   Regular exercise: walks daily   Caffeine use: 2 cups of coffee daily      Social Drivers of  Health   Financial Resource Strain: Low Risk  (05/28/2020)   Overall Financial Resource Strain (CARDIA)    Difficulty of Paying Living Expenses: Not hard at all  Food Insecurity: No Food Insecurity (05/28/2020)   Hunger Vital Sign    Worried About Running Out of Food in the Last Year: Never true    Ran Out of Food in the Last Year: Never true  Transportation Needs: No Transportation Needs (05/28/2020)   PRAPARE - Administrator, Civil Service (Medical): No    Lack of Transportation (Non-Medical): No  Physical Activity: Sufficiently Active (05/28/2020)   Exercise Vital Sign    Days of Exercise per Week: 7 days    Minutes of Exercise per Session: 60 min  Stress: No Stress Concern Present (05/28/2020)   [employer] of Occupational Health - Occupational Stress Questionnaire    Feeling of Stress : Not at all  Social Connections: Socially Integrated (05/28/2020)   Social Connection and Isolation Panel [NHANES]    Frequency of Communication with Friends and Family: Once a week    Frequency of Social Gatherings with Friends and Family: Three times a week    Attends Religious Services: More than 4 times per year    Active Member of Clubs or  Organizations: Yes    Attends Banker Meetings: 1 to 4 times per year    Marital Status: Married    Outpatient Medications Prior to Visit  Medication Sig Dispense Refill   Acetaminophen (TYLENOL PO) Take by mouth as needed.     amitriptyline  (ELAVIL ) 10 MG tablet Take 1 tablet (10 mg total) by mouth at bedtime. 90 tablet 0   Calcium Carbonate-Vitamin D  (CALCIUM-VITAMIN D ) 500-200 MG-UNIT per tablet Take 1 tablet by mouth 2 (two) times daily with a meal. Reported on 09/27/2015     clonazePAM  (KLONOPIN ) 0.5 MG tablet TAKE 1/2 (ONE-HALF) TABLET BY MOUTH AT BEDTIME AS NEEDED 45 tablet 0   Multiple Vitamins-Minerals (MULTIVITAL) tablet Take 1 tablet by mouth daily. Reported on 09/27/2015     tretinoin (RETIN-A) 0.025 % cream Reported on 09/27/2015 (Patient not taking: Reported on 03/16/2023)     No facility-administered medications prior to visit.     EXAM:  BP (!) 150/80 (BP Location: Right Arm, Patient Position: Sitting, Cuff Size: Normal)   Pulse 68   Temp 97.8 F (36.6 C) (Oral)   Ht 5' 2.5 (1.588 m)   Wt 108 lb 3.2 oz (49.1 kg)   SpO2 98%   BMI 19.47 kg/m   Body mass index is 19.47 kg/m.  GENERAL: vitals reviewed and listed above, alert, , appears well hydrated and in no acute distress hard of hearing but normal conversation  had r ue in old wrist rigit spint  loosly  HEENT: atraumatic, conjunctiva  clear, no obvious abnormalities on inspection of external nose and ears  NECK: no obvious masses on inspection palpation  LUNGS: clear to auscultation bilaterally, no wheezes, rales or rhonchi, good air movement scoliosis  CV: HRRR, no clubbing cyanosis or  peripheral edema nl cap refill  MS: moves all extremities without noticeable focal  abnormality  PSYCH: pleasant and cooperative, no obvious depression or anxiety Lab Results  Component Value Date   WBC 6.9 08/06/2022   HGB 14.1 08/06/2022   HCT 43.3 08/06/2022   PLT 214.0 08/06/2022   GLUCOSE 89 08/06/2022    CHOL 202 (H) 08/06/2022   TRIG 81.0 08/06/2022   HDL 64.70 08/06/2022   LDLDIRECT 136.8 03/14/2013  LDLCALC 122 (H) 08/06/2022   ALT 17 08/06/2022   AST 24 08/06/2022   NA 137 08/06/2022   K 4.3 08/06/2022   CL 99 08/06/2022   CREATININE 0.82 08/06/2022   BUN 22 08/06/2022   CO2 30 08/06/2022   TSH 0.79 08/06/2022   INR 0.98 11/11/2010   BP Readings from Last 3 Encounters:  03/16/23 (!) 150/80  11/10/22 124/66  08/06/22 (!) 150/90    ASSESSMENT AND PLAN:  Discussed the following assessment and plan:  Medication management  Elevated blood pressure reading  Mild memory disturbance  Hearing deficit, bilateral  Osteoporosis, unspecified osteoporosis type, unspecified pathological fracture presence - Plan: DG Bone Density  History of fracture - r wrist - Plan: DG Bone Density  Get updated dexa  order to [facility]  Encouraged to change again  wrist support if needed and add resistance training  do not fear refracture  ( Memory issues may be barrier)  willing to do resistance bands. Continue weigh bearing exercise . Plan updated labs in May and then yearly visit and or cpe other  Record review plan and ordering 32 minutes  -Patient advised to return or notify health care team  if  new concerns arise.  Patient Instructions  Need  updated  dexa scan  .we can order at [facility] or wherever done. Last.  And begin the resistance bands .  With   soft wrist support for bone health. Stay on  vit D .  Will send in  order for  dexa at the breast center and they should contact you about appt.   BP is up again   send in more readings in next few weeks   goal  140 and below  Make lab appt in May and then  fu visit  preventive visit .   Janet Decesare K. Serapio Edelson M.D.

## 2023-03-16 NOTE — Patient Instructions (Addendum)
 Need  updated  dexa scan  .we can order at [facility] or wherever done. Last.  And begin the resistance bands .  With   soft wrist support for bone health. Stay on  vit D .  Will send in  order for  dexa at the breast center and they should contact you about appt.   BP is up again   send in more readings in next few weeks   goal  140 and below  Make lab appt in May and then  fu visit  preventive visit .

## 2023-03-16 NOTE — Progress Notes (Signed)
 Future  labs for may 25 yearly

## 2023-03-22 ENCOUNTER — Telehealth: Payer: Self-pay

## 2023-03-22 NOTE — Telephone Encounter (Signed)
 Order was faxed to [facility].

## 2023-03-22 NOTE — Telephone Encounter (Signed)
 Copied from CRM [id number]. Topic: Clinical - Medical Advice >> Mar 22, 2023  2:08 PM Joen B wrote: Reason for CRM: PATIENT EMERGENCY CONTACT CALLED WANTING TO KNOW IF DR [facility] WOULD HAVE [place]'S BONE DENSITY TEST SCHEDULE AT [facility] @[address] PLEASE CONTACT DAUGHTER LISTED AT EMERGENCY CONTACT BECAUSE PARENTS ARE HEARING EMPAIRED

## 2023-03-22 NOTE — Telephone Encounter (Signed)
 Attempted to reach pt's daughter on [place]. Left a voicemail to call us back.

## 2023-04-07 ENCOUNTER — Other Ambulatory Visit: Payer: Self-pay | Admitting: Family Medicine

## 2023-04-14 DIAGNOSIS — M8588 Other specified disorders of bone density and structure, other site: Secondary | ICD-10-CM | POA: Diagnosis not present

## 2023-04-14 DIAGNOSIS — N958 Other specified menopausal and perimenopausal disorders: Secondary | ICD-10-CM | POA: Diagnosis not present

## 2023-04-14 DIAGNOSIS — E2839 Other primary ovarian failure: Secondary | ICD-10-CM | POA: Diagnosis not present

## 2023-04-14 LAB — HM DEXA SCAN

## 2023-04-23 ENCOUNTER — Telehealth: Payer: Self-pay | Admitting: Internal Medicine

## 2023-04-23 HISTORY — PX: MULTIPLE TOOTH EXTRACTIONS: SHX2053

## 2023-04-23 NOTE — Telephone Encounter (Signed)
Copied from CRM [id number]. Topic: General - Other >> Apr 23, 2023  9:30 AM Irine Seal wrote: Reason for CRM: Kathlene November, the patient's son, called to inform us that his employer will be sending additional paperwork regarding his intermittent leave of absence to care for his mother. He has utilized all the time initially granted by [employer] and is requesting an extension. He wanted to ensure Vickii Chafe, CMA, is aware that the paperwork will be arriving shortly.

## 2023-04-26 ENCOUNTER — Encounter: Payer: Self-pay | Admitting: Internal Medicine

## 2023-04-26 ENCOUNTER — Telehealth: Payer: Self-pay | Admitting: Internal Medicine

## 2023-04-26 NOTE — Telephone Encounter (Signed)
 Pt has video visit with Dr. Fabian Sharp tomorrow to go over son's Disability and Leave form.   Form placed in provider's red folder.

## 2023-04-26 NOTE — Telephone Encounter (Signed)
 Copied from CRM [id number]. Topic: General - Other >> Apr 26, 2023 11:01 AM Dennison Nancy wrote: Reason for CRM: Patient son  call last Thursday and checking on the status of called about the intermitt leave for Jacqueline Burgess  Please call Tonja Jezewski at  [phone] Need to explain if received the paperwork and to explain what happened and if  the company that do the leave sent the paperwork

## 2023-04-26 NOTE — Progress Notes (Signed)
 Osteoporosis confirmed and  high risk for fracture   Consider  anabolic rx such as evenity  to decrease risk of future fracture   which is a monthly injection for a year .  We can discuss at OV  but I can do  a referral also  to endocrinology for further discussion .   May we do a referral?

## 2023-04-26 NOTE — Telephone Encounter (Signed)
 Spoke to pt's son. Inform him we receive the form.   He reports [employer] requested additional information to be add it her his form.   Offer to schedule a virtual visit. Pt's son said his schedule is conflicted and request to call him back after he check with his sibling.   Inform him if he don't hear from [place] in 15 mins, he can call us and schedule his virtual visit with Dr Fabian Sharp if he is still able to do tomorrow appt.

## 2023-04-27 ENCOUNTER — Encounter: Payer: Self-pay | Admitting: Internal Medicine

## 2023-04-27 ENCOUNTER — Telehealth (INDEPENDENT_AMBULATORY_CARE_PROVIDER_SITE_OTHER): Payer: Medicare HMO | Admitting: Internal Medicine

## 2023-04-27 DIAGNOSIS — M810 Age-related osteoporosis without current pathological fracture: Secondary | ICD-10-CM | POA: Diagnosis not present

## 2023-04-27 DIAGNOSIS — Z79899 Other long term (current) drug therapy: Secondary | ICD-10-CM

## 2023-04-27 DIAGNOSIS — Z636 Dependent relative needing care at home: Secondary | ICD-10-CM

## 2023-04-27 DIAGNOSIS — R413 Other amnesia: Secondary | ICD-10-CM | POA: Diagnosis not present

## 2023-04-27 DIAGNOSIS — Z8781 Personal history of (healed) traumatic fracture: Secondary | ICD-10-CM

## 2023-04-27 DIAGNOSIS — H9190 Unspecified hearing loss, unspecified ear: Secondary | ICD-10-CM

## 2023-04-27 NOTE — Progress Notes (Unsigned)
 Virtual Visit via Video Note  I connected with Jacqueline Burgess on 04/27/23 at  3:00 PM EST by a video enabled telemedicine application and verified that I am speaking with the correct person using two identifiers. Location patient: home Location provider:work office Persons participating in the virtual visit: patient, provider and daughter  Nicholos Johns   Patient aware  of the limitations of evaluation and management by telemedicine and  availability of in person appointments. and agreed to proceed.   HPI: Jacqueline Burgess presents for video visit with  daughter  Increasing need for family medical care visits etc   Kathlene November son works as Public affairs consultant and need updated  form to [employer] and hours for appts   for his mom. She just had molar and wisdom tooth extraction for cracked an decayed  some probme still ossozing  on antibiotic . But will fu next week if needed.    See dexaa report    high risk of fracture      has remote hx of bishphosphonates and had se and no recnet  cannot take prolia? Rash  and eye infection?   Would be interested  willing to try in anabolic  eventity  if covered  she is active otherwise walking . Active .  ROS: See pertinent positives and negatives per HPI.  Past Medical History:  Diagnosis Date   Arthritis    Cataract    lens implant   Closed pelvic fracture (HCC) 2012   pelvis and shoulder    Colon polyps    Hx of fall    fracture pelvis and left shoulder humerus   Hx of nonmelanoma skin cancer    ssca   Hx of syncope    had neg event monitor per dr Tammy Sours taylor in 2010   Hypercalciuria    pu on hctz per record   Kidney stones 1967   MVP (mitral valve prolapse)    in records  no echo eval in records   Osteoporosis    eval dr Sharl Ma in past dexa 11 12     Past Surgical History:  Procedure Laterality Date   CATARACT EXTRACTION     left   CHOLECYSTECTOMY  1999   KIDNEY STONE SURGERY  1967   MOHS SURGERY  2017   MULTIPLE TOOTH EXTRACTIONS Right  04/23/2023   daughter reports pt had 2 teeth extraction.   SHOULDER SURGERY  11/2010    Family History  Problem Relation Age of Onset   CVA Mother        Died in her 39s complications rheumatic  fever afterwards when she got a stroke   Rheumatic fever Mother    Lung cancer Sister        Died in 25s was a smoker   Thyroid cancer Daughter        Papillary    Social History   Tobacco Use   Smoking status: Never   Smokeless tobacco: Never  Vaping Use   Vaping status: Never Used  Substance Use Topics   Alcohol use: Yes    Alcohol/week: 3.0 standard drinks of alcohol    Types: 3 Standard drinks or equivalent per week   Drug use: No      Current Outpatient Medications:    Acetaminophen (TYLENOL PO), Take by mouth as needed., Disp: , Rfl:    amitriptyline (ELAVIL) 10 MG tablet, TAKE 1 TABLET BY MOUTH AT BEDTIME, Disp: 90 tablet, Rfl: 0   Calcium Carbonate-Vitamin D (CALCIUM-VITAMIN D)  500-200 MG-UNIT per tablet, Take 1 tablet by mouth 2 (two) times daily with a meal. Reported on 09/27/2015, Disp: , Rfl:    clonazePAM (KLONOPIN) 0.5 MG tablet, TAKE 1/2 (ONE-HALF) TABLET BY MOUTH AT BEDTIME AS NEEDED, Disp: 45 tablet, Rfl: 0   Multiple Vitamins-Minerals (MULTIVITAL) tablet, Take 1 tablet by mouth daily. Reported on 09/27/2015, Disp: , Rfl:    penicillin v potassium (VEETID) 500 MG tablet, Take 500 mg by mouth 4 (four) times daily., Disp: , Rfl:    tretinoin (RETIN-A) 0.025 % cream, Reported on 09/27/2015, Disp: , Rfl:   EXAM: BP Readings from Last 3 Encounters:  03/16/23 (!) 150/80  11/10/22 124/66  08/06/22 (!) 150/90    VITALS per patient if applicable:  GENERAL: alert, oriented, appears well and in no acute distress non toxic   HEENT: atraumatic, conjunttiva clear, no obvious abnormalities on inspection of external nose and ears  NECK: normal movements of the head and neck   PSYCH/NEURO: pleasant and cooperative, no obvious depression or anxiety, speech and thought  processing grossly intact Lab Results  Component Value Date   WBC 6.9 08/06/2022   HGB 14.1 08/06/2022   HCT 43.3 08/06/2022   PLT 214.0 08/06/2022   GLUCOSE 89 08/06/2022   CHOL 202 (H) 08/06/2022   TRIG 81.0 08/06/2022   HDL 64.70 08/06/2022   LDLDIRECT 136.8 03/14/2013   LDLCALC 122 (H) 08/06/2022   ALT 17 08/06/2022   AST 24 08/06/2022   NA 137 08/06/2022   K 4.3 08/06/2022   CL 99 08/06/2022   CREATININE 0.82 08/06/2022   BUN 22 08/06/2022   CO2 30 08/06/2022   TSH 0.79 08/06/2022   INR 0.98 11/11/2010    ASSESSMENT AND PLAN:  Discussed the following assessment and plan:    ICD-10-CM   1. Medication management  Z79.899     2. Mild memory disturbance  R41.3     3. Osteoporosis, unspecified osteoporosis type, unspecified pathological fracture presence  M81.0     4. High risk medication use  Z79.899     5. History of fracture  Z87.81     6. son is Care provider for parents  Z63.6     7. Decreased hearing, unspecified laterality  H91.90      See  dexa scan -3 spine -4.3 radius -2.8 hip  Frax scores  24% any fracture and 8.8% hip  Counseled.   Expectant management and discussion of plan and treatment with opportunity to ask questions and all were answered. The patient agreed with the plan and demonstrated an understanding of the instructions.  form  for son fmla  completed .   Advised to call back or seek an in-person evaluation if worsening  or having  further concerns  in interim. Return for record revewi  will need updated labs and fu , depending on results.    Berniece Andreas, MD

## 2023-04-29 ENCOUNTER — Telehealth: Payer: Self-pay

## 2023-04-29 DIAGNOSIS — Z0279 Encounter for issue of other medical certificate: Secondary | ICD-10-CM

## 2023-04-29 NOTE — Telephone Encounter (Signed)
 Contacted pt's Jacqueline Burgess,  on [place].   Inform him the form is completed by Dr. Fabian Sharp. Just his part to be filled.   Pt verbalized understanding and states he will stop by tomorrow.

## 2023-05-03 ENCOUNTER — Telehealth: Payer: Self-pay

## 2023-05-03 NOTE — Telephone Encounter (Signed)
 Pt has a lab appt on 08/04/2023 for the blood work.   Spoke to daughter. Daughter ask if pt's insurance would cover the Evenity injection.   Inform daughter, this cma doesn't know. Daughter states she will contact pt's insurance and find out.   No further action is needed.

## 2023-05-03 NOTE — Telephone Encounter (Signed)
-----   Message from Berniece Andreas sent at 04/30/2023  7:32 PM EST ----- Please tell family and patient she needs to  her lab done( orders in system ) and need to make lab appt.   After tis we can consider ordering a new  osteoporosis medication

## 2023-05-17 ENCOUNTER — Other Ambulatory Visit: Payer: Self-pay | Admitting: Internal Medicine

## 2023-05-25 DIAGNOSIS — H5000 Unspecified esotropia: Secondary | ICD-10-CM | POA: Diagnosis not present

## 2023-05-25 DIAGNOSIS — H532 Diplopia: Secondary | ICD-10-CM | POA: Diagnosis not present

## 2023-05-25 DIAGNOSIS — H04123 Dry eye syndrome of bilateral lacrimal glands: Secondary | ICD-10-CM | POA: Diagnosis not present

## 2023-05-25 DIAGNOSIS — Z961 Presence of intraocular lens: Secondary | ICD-10-CM | POA: Diagnosis not present

## 2023-07-10 ENCOUNTER — Other Ambulatory Visit: Payer: Self-pay | Admitting: Internal Medicine

## 2023-08-04 ENCOUNTER — Other Ambulatory Visit (INDEPENDENT_AMBULATORY_CARE_PROVIDER_SITE_OTHER): Payer: Medicare HMO

## 2023-08-04 ENCOUNTER — Ambulatory Visit: Payer: Self-pay | Admitting: Internal Medicine

## 2023-08-04 DIAGNOSIS — Z79899 Other long term (current) drug therapy: Secondary | ICD-10-CM

## 2023-08-04 DIAGNOSIS — M810 Age-related osteoporosis without current pathological fracture: Secondary | ICD-10-CM

## 2023-08-04 DIAGNOSIS — E559 Vitamin D deficiency, unspecified: Secondary | ICD-10-CM | POA: Diagnosis not present

## 2023-08-04 DIAGNOSIS — Z8781 Personal history of (healed) traumatic fracture: Secondary | ICD-10-CM

## 2023-08-04 DIAGNOSIS — R413 Other amnesia: Secondary | ICD-10-CM | POA: Diagnosis not present

## 2023-08-04 DIAGNOSIS — G4700 Insomnia, unspecified: Secondary | ICD-10-CM

## 2023-08-04 LAB — HEPATIC FUNCTION PANEL
ALT: 16 U/L (ref 0–35)
AST: 20 U/L (ref 0–37)
Albumin: 4.3 g/dL (ref 3.5–5.2)
Alkaline Phosphatase: 74 U/L (ref 39–117)
Bilirubin, Direct: 0.1 mg/dL (ref 0.0–0.3)
Total Bilirubin: 0.3 mg/dL (ref 0.2–1.2)
Total Protein: 6.6 g/dL (ref 6.0–8.3)

## 2023-08-04 LAB — TSH: TSH: 1.07 u[IU]/mL (ref 0.35–5.50)

## 2023-08-04 LAB — CBC WITH DIFFERENTIAL/PLATELET
Basophils Absolute: 0 10*3/uL (ref 0.0–0.1)
Basophils Relative: 0.4 % (ref 0.0–3.0)
Eosinophils Absolute: 0.1 10*3/uL (ref 0.0–0.7)
Eosinophils Relative: 1.7 % (ref 0.0–5.0)
HCT: 40.7 % (ref 36.0–46.0)
Hemoglobin: 13.7 g/dL (ref 12.0–15.0)
Lymphocytes Relative: 27 % (ref 12.0–46.0)
Lymphs Abs: 1.6 10*3/uL (ref 0.7–4.0)
MCHC: 33.7 g/dL (ref 30.0–36.0)
MCV: 90.7 fl (ref 78.0–100.0)
Monocytes Absolute: 0.5 10*3/uL (ref 0.1–1.0)
Monocytes Relative: 9.2 % (ref 3.0–12.0)
Neutro Abs: 3.6 10*3/uL (ref 1.4–7.7)
Neutrophils Relative %: 61.7 % (ref 43.0–77.0)
Platelets: 201 10*3/uL (ref 150.0–400.0)
RBC: 4.49 Mil/uL (ref 3.87–5.11)
RDW: 13.6 % (ref 11.5–15.5)
WBC: 5.9 10*3/uL (ref 4.0–10.5)

## 2023-08-04 LAB — BASIC METABOLIC PANEL WITH GFR
BUN: 26 mg/dL — ABNORMAL HIGH (ref 6–23)
CO2: 28 meq/L (ref 19–32)
Calcium: 9 mg/dL (ref 8.4–10.5)
Chloride: 100 meq/L (ref 96–112)
Creatinine, Ser: 0.64 mg/dL (ref 0.40–1.20)
GFR: 80.92 mL/min (ref >60.00)
Glucose, Bld: 100 mg/dL — ABNORMAL HIGH (ref 70–99)
Potassium: 4.2 meq/L (ref 3.5–5.1)
Sodium: 134 meq/L — ABNORMAL LOW (ref 135–145)

## 2023-08-04 LAB — LIPID PANEL
Cholesterol: 197 mg/dL (ref 0–200)
HDL: 66.8 mg/dL (ref >39.00)
LDL Cholesterol: 107 mg/dL — ABNORMAL HIGH (ref 0–99)
NonHDL: 130.66
Total CHOL/HDL Ratio: 3
Triglycerides: 117 mg/dL (ref 0.0–149.0)
VLDL: 23.4 mg/dL (ref 0.0–40.0)

## 2023-08-04 LAB — VITAMIN D 25 HYDROXY (VIT D DEFICIENCY, FRACTURES): VITD: 27.15 ng/mL — ABNORMAL LOW (ref 30.00–100.00)

## 2023-08-04 NOTE — Progress Notes (Signed)
 Vit d slightly low  increase vitamin d3 by 1000 units per day until next upcoming appt.

## 2023-08-11 ENCOUNTER — Other Ambulatory Visit: Payer: Self-pay | Admitting: Internal Medicine

## 2023-08-26 ENCOUNTER — Telehealth: Payer: Self-pay

## 2023-08-26 NOTE — Telephone Encounter (Signed)
 Spoke to pt's daughter. And inform her of provider's message. Daughter reports she will contact insurance about coverage on evenity first before deciding on osteoporosis clinic and will follow up at office.   Forwarding to provider.

## 2023-08-26 NOTE — Telephone Encounter (Signed)
 Attempted to reach pt. Left a voicemail to call us back.

## 2023-08-26 NOTE — Telephone Encounter (Signed)
 Copied from CRM [id number]. Topic: Clinical - Lab/Test Results >> Aug 25, 2023  3:06 PM Earnestine Goes B wrote: Reason for CRM: pt;s daughter Marston Skiff called regarding lab results for pt . Please call  her back at [phone]

## 2023-08-26 NOTE — Telephone Encounter (Signed)
 We can also  get her appt in osteoporosis clinic  at [facility]    Vit d level should be optimized  . There is a process about getting evenity approved .  And I think the osteoporosis clinic may have a more streamlined  path. See lab  results and comment    vit d level lsightly low and to ncrease supplement by 1000 international units per day

## 2023-08-26 NOTE — Telephone Encounter (Signed)
 Spoke to pt's daughter informs that brother will bring patient in for her visit. Daughter wanted to talk to dr. Ethel Henry about if pt is candidate for evenity. Also concerns on pt's losing weight. Inform daughter provider call discuss it at the visit. Advise daughter to contact insurance regarding to coverage on Evenity. Daughter verbalized understanding.

## 2023-09-01 ENCOUNTER — Encounter: Payer: Self-pay | Admitting: Internal Medicine

## 2023-09-01 ENCOUNTER — Ambulatory Visit: Payer: Medicare HMO | Admitting: Internal Medicine

## 2023-09-01 VITALS — BP 118/62 | HR 105 | Temp 98.1°F | Ht 62.5 in | Wt 107.8 lb

## 2023-09-01 DIAGNOSIS — Z0001 Encounter for general adult medical examination with abnormal findings: Secondary | ICD-10-CM

## 2023-09-01 DIAGNOSIS — H6121 Impacted cerumen, right ear: Secondary | ICD-10-CM

## 2023-09-01 DIAGNOSIS — R7989 Other specified abnormal findings of blood chemistry: Secondary | ICD-10-CM

## 2023-09-01 DIAGNOSIS — G4700 Insomnia, unspecified: Secondary | ICD-10-CM

## 2023-09-01 DIAGNOSIS — J40 Bronchitis, not specified as acute or chronic: Secondary | ICD-10-CM | POA: Diagnosis not present

## 2023-09-01 DIAGNOSIS — Z79899 Other long term (current) drug therapy: Secondary | ICD-10-CM | POA: Diagnosis not present

## 2023-09-01 DIAGNOSIS — M810 Age-related osteoporosis without current pathological fracture: Secondary | ICD-10-CM

## 2023-09-01 DIAGNOSIS — R413 Other amnesia: Secondary | ICD-10-CM | POA: Diagnosis not present

## 2023-09-01 DIAGNOSIS — Z0000 Encounter for general adult medical examination without abnormal findings: Secondary | ICD-10-CM

## 2023-09-01 DIAGNOSIS — I499 Cardiac arrhythmia, unspecified: Secondary | ICD-10-CM | POA: Diagnosis not present

## 2023-09-01 DIAGNOSIS — H9193 Unspecified hearing loss, bilateral: Secondary | ICD-10-CM

## 2023-09-01 MED ORDER — AZITHROMYCIN 250 MG PO TABS: ORAL_TABLET | ORAL | 0 refills | Status: AC

## 2023-09-01 NOTE — Patient Instructions (Addendum)
  Help with  nail clipping. See podiatry  when convenient  EKG today cause of irreg heart beat. PAcs   non specific cause  Treating for  bronchitis sinusitis today allergy could be component but  not total picture. . Could be viral but rx for bacterial  also . Would plan  seeing osteoporosis clinic to   to consider   evenity . Other easier to rx only contraindication is high risk CV disease   Would  want to decrease the evening meds as we discussed in past because of the memory issues  There is a more formal work up for the memory issues  fu  maybe back to dr Georjean . Will talk  angela. Pharmacist about medications.   Plan fu   2-3 mos. Wednesday.

## 2023-09-01 NOTE — Progress Notes (Signed)
 Chief Complaint  Patient presents with   Annual Exam   Cough    Son- Garrel reports pt walks everyday. Not taking allergy med. Wonder if cough has to do with allergy. Cough been going on for a wk. Denied fever, sore throa, bodyache. Some fatigue.     HPI: Patient  Jacqueline Burgess  83 y.o. comes in today for Preventive Health Care visit  and multiple concrens here with son caretaker.  And med check  Sleep on meds for years  and says sleeps well 1/2 of 0.5 clonazepam  and 10 amytryptiline  Hearing deficit not wear:ing  aids today  please check for wax since seems worse  Osteoporosis  hx low BD   still wears the r wrist brace  out of habit no matter  support is available at home . Memory seems to be declining more  did see Dr Georjean a number of years ago before covid  Has had a cough for over a week   she says 3 weeks family says  about a week  no fever but upper resp congestion also .  Denis sob change but not doing  her full walks  like usual recently .  Eating ok fluids ok  some sinus pressure   denies specific pain .   Health Maintenance  Topic Date Due   Zoster Vaccines- Shingrix (1 of 2) Never done   Colonoscopy  07/07/2015   Medicare Annual Wellness (AWV)  05/28/2021   COVID-19 Vaccine (4 - 2024-25 season) 09/17/2023 (Originally 11/08/2022)   INFLUENZA VACCINE  10/08/2023   DTaP/Tdap/Td (2 - Tdap) 03/13/2024   Pneumococcal Vaccine: 50+ Years  Completed   DEXA SCAN  Completed   Hepatitis B Vaccines  Aged Out   HPV VACCINES  Aged Out   Meningococcal B Vaccine  Aged Out   Health Maintenance Review LIFESTYLE:  NO recenet falls  Lives with spouse but family check every day and not driving  Will walk every day  (saying rosary) Neg tad  sleep  see above.  ROS:   See above  Denies cp sob bleeding  no recent falls  REST of 12 system review negative except as per HPI   Past Medical History:  Diagnosis Date   Arthritis    Cataract    lens implant   Closed pelvic fracture  (HCC) 2012   pelvis and shoulder    Colon polyps    Hx of fall    fracture pelvis and left shoulder humerus   Hx of nonmelanoma skin cancer    ssca   Hx of syncope    had neg event monitor per dr cathlyn taylor in 2010   Hypercalciuria    pu on hctz per record   Kidney stones 1967   MVP (mitral valve prolapse)    in records  no echo eval in records   Osteoporosis    eval dr faythe in past dexa 11 12     Past Surgical History:  Procedure Laterality Date   CATARACT EXTRACTION     left   CHOLECYSTECTOMY  1999   KIDNEY STONE SURGERY  1967   MOHS SURGERY  2017   MULTIPLE TOOTH EXTRACTIONS Right 04/23/2023   daughter reports pt had 2 teeth extraction.   SHOULDER SURGERY  11/2010    Family History  Problem Relation Age of Onset   CVA Mother        Died in her 38s complications rheumatic  fever afterwards when she got  a stroke   Rheumatic fever Mother    Lung cancer Sister        Died in 35s was a smoker   Thyroid  cancer Daughter        Papillary    Social History   Socioeconomic History   Marital status: Married    Spouse name: Not on file   Number of children: Not on file   Years of education: Not on file   Highest education level: Not on file  Occupational History   Not on file  Tobacco Use   Smoking status: Never   Smokeless tobacco: Never  Vaping Use   Vaping status: Never Used  Substance and Sexual Activity   Alcohol use: Yes    Alcohol/week: 3.0 standard drinks of alcohol    Types: 3 Standard drinks or equivalent per week   Drug use: No   Sexual activity: Yes    Partners: Female  Other Topics Concern   Not on file  Social History Narrative   Married retired Runner, 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   g4 P4   Regular exercise: walks daily   Caffeine use: 2 cups of coffee daily      Social Drivers of Health    Financial Resource Strain: Low Risk  (05/28/2020)   Overall Financial Resource Strain (CARDIA)    Difficulty of Paying Living Expenses: Not hard at all  Food Insecurity: No Food Insecurity (05/28/2020)   Hunger Vital Sign    Worried About Running Out of Food in the Last Year: Never true    Ran Out of Food in the Last Year: Never true  Transportation Needs: No Transportation Needs (05/28/2020)   PRAPARE - Administrator, Civil Service (Medical): No    Lack of Transportation (Non-Medical): No  Physical Activity: Sufficiently Active (05/28/2020)   Exercise Vital Sign    Days of Exercise per Week: 7 days    Minutes of Exercise per Session: 60 min  Stress: No Stress Concern Present (05/28/2020)   [employer] of Occupational Health - Occupational Stress Questionnaire    Feeling of Stress : Not at all  Social Connections: Socially Integrated (05/28/2020)   Social Connection and Isolation Panel    Frequency of Communication with Friends and Family: Once a week    Frequency of Social Gatherings with Friends and Family: Three times a week    Attends Religious Services: More than 4 times per year    Active Member of Clubs or Organizations: Yes    Attends Banker Meetings: 1 to 4 times per year    Marital Status: Married    Outpatient Medications Prior to Visit  Medication Sig Dispense Refill   Acetaminophen (TYLENOL PO) Take by mouth as needed.     amitriptyline  (ELAVIL ) 10 MG tablet TAKE 1 TABLET BY MOUTH AT BEDTIME 90 tablet 0   Calcium Carbonate-Vitamin D  (CALCIUM-VITAMIN D ) 500-200 MG-UNIT per tablet Take 1 tablet by mouth 2 (two) times daily with a meal. Reported on 09/27/2015     clonazePAM  (KLONOPIN ) 0.5 MG tablet TAKE 1/2 (ONE-HALF) TABLET BY MOUTH AT BEDTIME AS NEEDED 45 tablet 0   Multiple Vitamins-Minerals (MULTIVITAL) tablet Take 1 tablet by mouth daily. Reported on 09/27/2015     tretinoin (RETIN-A) 0.025 % cream Reported on 09/27/2015  penicillin v  potassium (VEETID) 500 MG tablet Take 500 mg by mouth 4 (four) times daily. (Patient not taking: Reported on 09/01/2023)     No facility-administered medications prior to visit.     EXAM:  BP 118/62 (BP Location: Right Arm, Patient Position: Sitting, Cuff Size: Normal)   Pulse (!) 105   Temp 98.1 F (36.7 C) (Oral)   Ht 5' 2.5 (1.588 m)   Wt 107 lb 12.8 oz (48.9 kg)   BMI 19.40 kg/m   Body mass index is 19.4 kg/m. Wt Readings from Last 3 Encounters:  09/01/23 107 lb 12.8 oz (48.9 kg)  03/16/23 108 lb 3.2 oz (49.1 kg)  11/10/22 106 lb 6.4 oz (48.3 kg)    Physical Exam: Vital signs reviewed HZW:Uypd is a well-developed slender alert cooperative    who appearsr stated age in no acute distress.  W upper congestion  and ocass deep bronchial cough  HEENT: normocephalic atraumatic , Eyes: PERRL EOM's full, conjunctiva clear, Nares: paten,t no deformity discharge or tenderness. Face mild ? R max tender , Ears: no deformity EAC's mod wax r removed irrigated TMs with normal landmarks. Mouth: clear OP, no lesions, edema.  Moist mucous membranes.  repair. NECK: supple without masses, thyromegaly or bruits. CHEST/PULM:  ?Clear to auscultation and percussion breath sounds equal no wheeze , rales or rhonchi. Scoliosis  noted  Breast: normal by inspection . No dimpling, discharge, masses, tenderness or discharge . CV: PMI is nondisplaced, S1 S2 no gallops, murmurs, irregular beats  no rubs. Peripheral pulses are full without delay.No JVD .  ABDOMEN: Bowel sounds normal nontender  No guard or rebound, no hepato splenomegal no CVA tenderness.  Extremtities:  No clubbing cyanosis or edema, no acute joint swelling or redness no focal atrophy NEURO:  Oriented x3, cranial nerves 3-12 appear to be intact, except hard of hearing   no obvious focal weakness,gait within normal limits no abnormal reflexes SKIN: No acute rashes normal turgor, color, no bruising or petechiae. Long curved toenails thickened   PSYCH:  good eye contact, no obvious depression anxiety, nl conversation  memory not formally tested    but limited  conversation  dec hearing interfering  LN: no cervical axillary adenopathy  Lab Results  Component Value Date   WBC 5.9 08/04/2023   HGB 13.7 08/04/2023   HCT 40.7 08/04/2023   PLT 201.0 08/04/2023   GLUCOSE 100 (H) 08/04/2023   CHOL 197 08/04/2023   TRIG 117.0 08/04/2023   HDL 66.80 08/04/2023   LDLDIRECT 136.8 03/14/2013   LDLCALC 107 (H) 08/04/2023   ALT 16 08/04/2023   AST 20 08/04/2023   NA 134 (L) 08/04/2023   K 4.2 08/04/2023   CL 100 08/04/2023   CREATININE 0.64 08/04/2023   BUN 26 (H) 08/04/2023   CO2 28 08/04/2023   TSH 1.07 08/04/2023   INR 0.98 11/11/2010    BP Readings from Last 3 Encounters:  09/01/23 118/62  03/16/23 (!) 150/80  11/10/22 124/66   Last vitamin D  Lab Results  Component Value Date   VD25OH 27.15 (L) 08/04/2023    Lab results reviewed   ASSESSMENT AND PLAN:  Discussed the following assessment and plan:    ICD-10-CM   1. Visit for preventive health examination  Z00.00     2. Bronchitis  J40    with upper sinusitis  viral vs bacterial  high risk   add empritic antibiotic    3. Medication management  Z79.899     4.  High risk medication use  Z79.899     5. Osteoporosis, unspecified osteoporosis type, unspecified pathological fracture presence  M81.0 Amb Referral to Osteoporosis Management     6. Insomnia, unspecified type  G47.00    long standing medicaiton low dose but would prefer to have her wean off.    7. 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 Low vitamin D  level  R79.89     9. Irregular heart rhythm  I49.9 EKG 12-Lead   ekg  shows frequent pacs lafb    10. Mild memory disturbance  R41.3 Ambulatory referral to Neurology   seems  progressing but  has illnss and  no hearing aids today want to get her back to dr Georjean neuro    11. Excessive wax in right ear  H61.21     Saw dr Georjean in 2019   Need  to optimize  vit d level  Resp illness today  uncertain  level but   empiric rx for sinusitis bronchitis   is high risk cause of age etc.  EKG today shows frequent pacs and LAF block  no acute findings  Plan referral for osteoporosis  evaluation. Memory disturbance  may have progressed and advise fu  wil re refer back to Dr Georjean I would  like to get her off the night meds  that can effect fall and memory albeit very low dose . She had been taking meds for many years  and we had decreased dosing  Will consult with pharmacy consider change to lower dose  tca with less se  such ad doxipen and perhaps wean the clonipen.  Advise see podiatry for nail care   appears it has been awhile since adequate clipping   Record review counsel plan fu and referrals prolonged  visit   including prevention and problem based   60 minutes  Return for 2-3 months.  Patient Care Team: Erilyn Pearman, Apolinar POUR, MD as PCP - General (Internal Medicine) Estelle Service, MD as Attending Physician (Obstetrics and Gynecology) Josefina Chew, MD as Attending Physician (Orthopedic Surgery) Octavia Charleston, MD (Ophthalmology) Cary Doffing, MD as Attending Physician (Dermatology) Abran Norleen SAILOR, MD as Attending Physician (Gastroenterology) Patient Instructions   Help with  nail clipping. See podiatry  when convenient  EKG today cause of irreg heart beat. PAcs   non specific cause  Treating for  bronchitis sinusitis today allergy could be component but  not total picture. . Could be viral but rx for bacterial  also . Would plan  seeing osteoporosis clinic to   to consider   evenity . Other easier to rx only contraindication is high risk CV disease   Would  want to decrease the evening meds as we discussed in past because of the memory issues  There is a more formal work up for the memory issues  fu  maybe back to dr Georjean . Will talk  angela. Pharmacist about medications.   Plan fu   2-3 mos. Wednesday.   Kathlynn Swofford K. Zainah Steven  M.D.

## 2023-09-02 ENCOUNTER — Ambulatory Visit: Payer: Self-pay | Admitting: Internal Medicine

## 2023-09-05 DIAGNOSIS — H9193 Unspecified hearing loss, bilateral: Secondary | ICD-10-CM | POA: Insufficient documentation

## 2023-09-06 ENCOUNTER — Telehealth: Payer: Self-pay

## 2023-09-06 DIAGNOSIS — Z79899 Other long term (current) drug therapy: Secondary | ICD-10-CM

## 2023-09-06 NOTE — Progress Notes (Signed)
   09/06/2023  Patient ID: Jacqueline Burgess, female   DOB: 01-03-1941, 83 y.o.   MRN: [id number]  Received request from PCP to schedule patient to discuss potential for coming off clonazepam  and safer alternatives than amitriptyline .  Placing referral to scheduling so that appt can be established with patient to further discuss use of these medications and past history of other sleep aids, if any.  Jon VEAR Lindau, PharmD Clinical Pharmacist [phone]

## 2023-09-07 ENCOUNTER — Telehealth: Payer: Self-pay

## 2023-09-07 DIAGNOSIS — J40 Bronchitis, not specified as acute or chronic: Secondary | ICD-10-CM

## 2023-09-07 NOTE — Telephone Encounter (Signed)
 Spoke to daughter earlier regarding to Jacqueline Burgess. Afterward she brought up her concerns about pt's condition. Daughter reports pt finished her antibiotic on Sunday. She states pt is still weak and in bed during the day. Usually she doesn't stay in bed during the day. She continues pt is still coughing and not coughing up anything. She said she is prone to dehydration and will encourage pt to drink more fluid. Daughter would like to know how long should pt's sx linger for.  Daughter did not states pt's sx worsen when ask. Just sx of cough and fatigue. Daughter ask if need to obtain x-ray on pt.    Please advise.

## 2023-09-08 NOTE — Telephone Encounter (Signed)
 Spoke to pt's daughter, Rollo. Inform her of provider's recommendation. Daughter verbalized understanding. Daughter reports pt is feeling better today but would like forward with urgent chest x-ray. X-ray order is placed. There are no urgent option on the order. Order is sent as STAT.

## 2023-09-09 ENCOUNTER — Telehealth: Payer: Self-pay

## 2023-09-09 NOTE — Telephone Encounter (Signed)
 Received a message from STAT team about chest x-ray stating Xrays do not require a PA and the patient can walk-in to have it done at their closest imaging site    Contacted pt and spoke to daughter, Rollo. Inform her of information above. She thank for placing the order. Daughter states pt is feeling better, she will monitor pt and will go get chest xray if feels the need.    Forwarding to provider.

## 2023-09-15 ENCOUNTER — Telehealth: Payer: Self-pay | Admitting: *Deleted

## 2023-09-15 NOTE — Progress Notes (Signed)
 Care Guide Pharmacy Note  09/15/2023 Name: Jacqueline Burgess MRN: [id number] DOB: 1940/07/27  Referred By: Charlett Apolinar POUR, MD Reason for referral: Complex Care Management (Outreach to schedule referral with pharmacist ) and Call Attempt #1   Jacqueline Burgess is a 83 y.o. year old female who is a primary care patient of Panosh, Wanda K, MD.  Jacqueline Burgess was referred to the pharmacist for assistance related to: insomnia  An unsuccessful telephone outreach was attempted today to contact the patient who was referred to the pharmacy team for assistance with medication management. Additional attempts will be made to contact the patient.  Thedford Franks, CMA   [facility], [facility] Guide Direct Dial: [phone]  Fax: [phone] Website: [email or web address]

## 2023-09-16 NOTE — Progress Notes (Signed)
 Care Guide Pharmacy Note  09/16/2023 Name: Jacqueline Burgess MRN: [id number] DOB: 11/05/1940  Referred By: Charlett Apolinar POUR, MD Reason for referral: Complex Care Management (Outreach to schedule referral with pharmacist ) and Call Attempt #1   Jacqueline Burgess is a 83 y.o. year old female who is a primary care patient of Panosh, Wanda K, MD.  Jacqueline Burgess was referred to the pharmacist for assistance related to: insomnia   A second unsuccessful telephone outreach was attempted today to contact the patient who was referred to the pharmacy team for assistance with medication management. Additional attempts will be made to contact the patient.  Thedford Franks, CMA [place]  [facility], [facility] Guide Direct Dial: [phone]  Fax: [phone] Website: [email or web address]

## 2023-09-17 NOTE — Progress Notes (Signed)
 Care Guide Pharmacy Note  09/17/2023 Name: Jacqueline Burgess MRN: [id number] DOB: 16-Jul-1940  Referred By: Charlett Apolinar POUR, MD Reason for referral: Complex Care Management (Outreach to schedule referral with pharmacist ) and Call Attempt #1   Jacqueline Burgess is a 83 y.o. year old female who is a primary care patient of Panosh, Wanda K, MD.  Jacqueline Burgess was referred to the pharmacist for assistance related to: insomnia   A third unsuccessful telephone outreach was attempted today to contact the patient who was referred to the pharmacy team for assistance with medication management. The Population Health team is pleased to engage with this patient at any time in the future upon receipt of referral and should he/she be interested in assistance from the Population Health team.  Thedford Franks, CMA [facility]  [facility], [facility] Guide Direct Dial: [phone]  Fax: [phone] Website: [email or web address]

## 2023-09-20 ENCOUNTER — Encounter: Payer: Self-pay | Admitting: Physician Assistant

## 2023-09-22 ENCOUNTER — Ambulatory Visit: Admitting: Podiatry

## 2023-09-22 DIAGNOSIS — M79674 Pain in right toe(s): Secondary | ICD-10-CM

## 2023-09-22 DIAGNOSIS — B351 Tinea unguium: Secondary | ICD-10-CM | POA: Diagnosis not present

## 2023-09-22 DIAGNOSIS — M79675 Pain in left toe(s): Secondary | ICD-10-CM

## 2023-09-22 NOTE — Progress Notes (Signed)
  Subjective:  Patient ID: Jacqueline Burgess, female    DOB: 16-Sep-1940,  MRN: [id number]  Chief Complaint  Patient presents with   Nail Problem    Nail trim  Long thick nails    83 y.o. female returns for the above complaint. Patient presents with thickened elongated dystrophic mycotic toenails x 10 mild pain on palpation hurts with ambulation or shoe pressure would like to have a debride unable to do it herself denies any other acute complaints  Objective:  There were no vitals filed for this visit. Podiatric Exam: Vascular: dorsalis pedis and posterior tibial pulses are palpable bilateral. Capillary return is immediate. Temperature gradient is WNL. Skin turgor WNL  Sensorium: Normal Semmes Weinstein monofilament test. Normal tactile sensation bilaterally. Nail Exam: Pt has thick disfigured discolored nails with subungual debris noted bilateral entire nail hallux through fifth toenails.  Pain on palpation to the nails. Ulcer Exam: There is no evidence of ulcer or pre-ulcerative changes or infection. Orthopedic Exam: Muscle tone and strength are WNL. No limitations in general ROM. No crepitus or effusions noted.  Skin: No Porokeratosis. No infection or ulcers    Assessment & Plan:   1. Pain due to onychomycosis of toenails of both feet     Patient was evaluated and treated and all questions answered.  Onychomycosis with pain  -Nails palliatively debrided as below. -Educated on self-care  Procedure: Nail Debridement Rationale: pain  Type of Debridement: manual, sharp debridement. Instrumentation: Nail nipper, rotary burr. Number of Nails: 10  Procedures and Treatment: Consent by patient was obtained for treatment procedures. The patient understood the discussion of treatment and procedures well. All questions were answered thoroughly reviewed. Debridement of mycotic and hypertrophic toenails, 1 through 5 bilateral and clearing of subungual debris. No ulceration, no infection  noted.  Return Visit-Office Procedure: Patient instructed to return to the office for a follow up visit 3 months for continued evaluation and treatment.  Franky Blanch, DPM    Return in about 3 months (around 12/23/2023) for RFC .

## 2023-09-28 IMAGING — DX DG WRIST COMPLETE 3+V*R*
1 series · 4 of 4 positions shown · non-contrast
Comparison: None.

CLINICAL DATA: 80-year-old female status post fall with pain.

EXAM:
RIGHT WRIST - COMPLETE 3+ VIEW

[Series 1: wrist · 0.14mm/px · 4 of 4 slices shown]
[im 1/4]
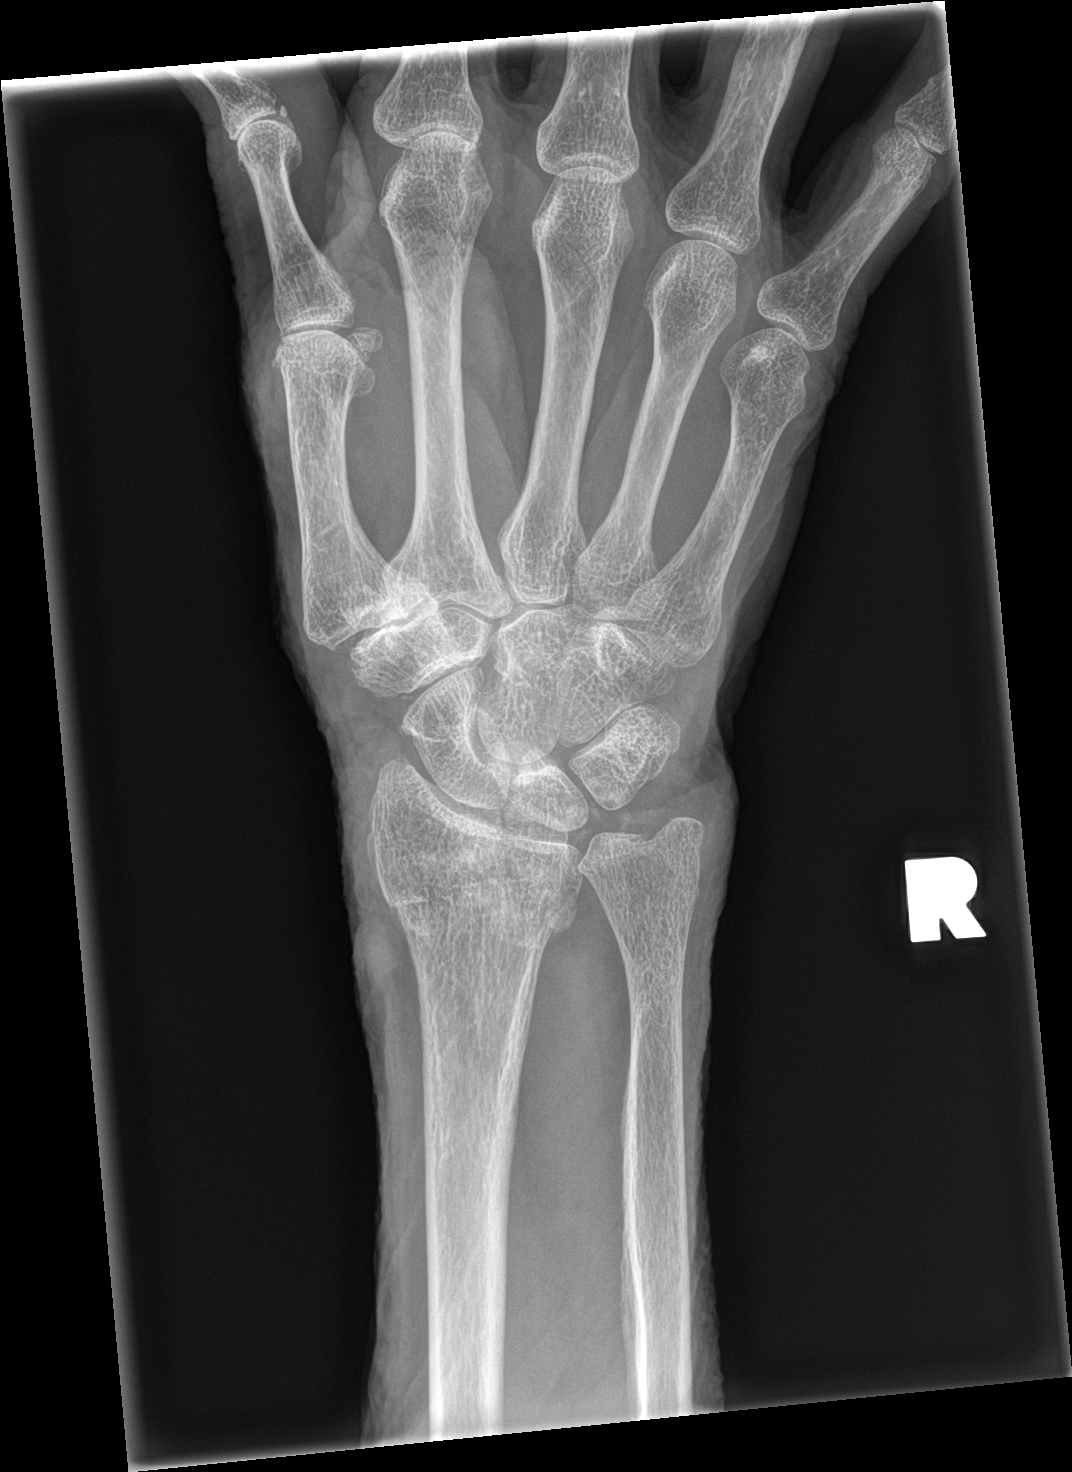
[im 2/4]
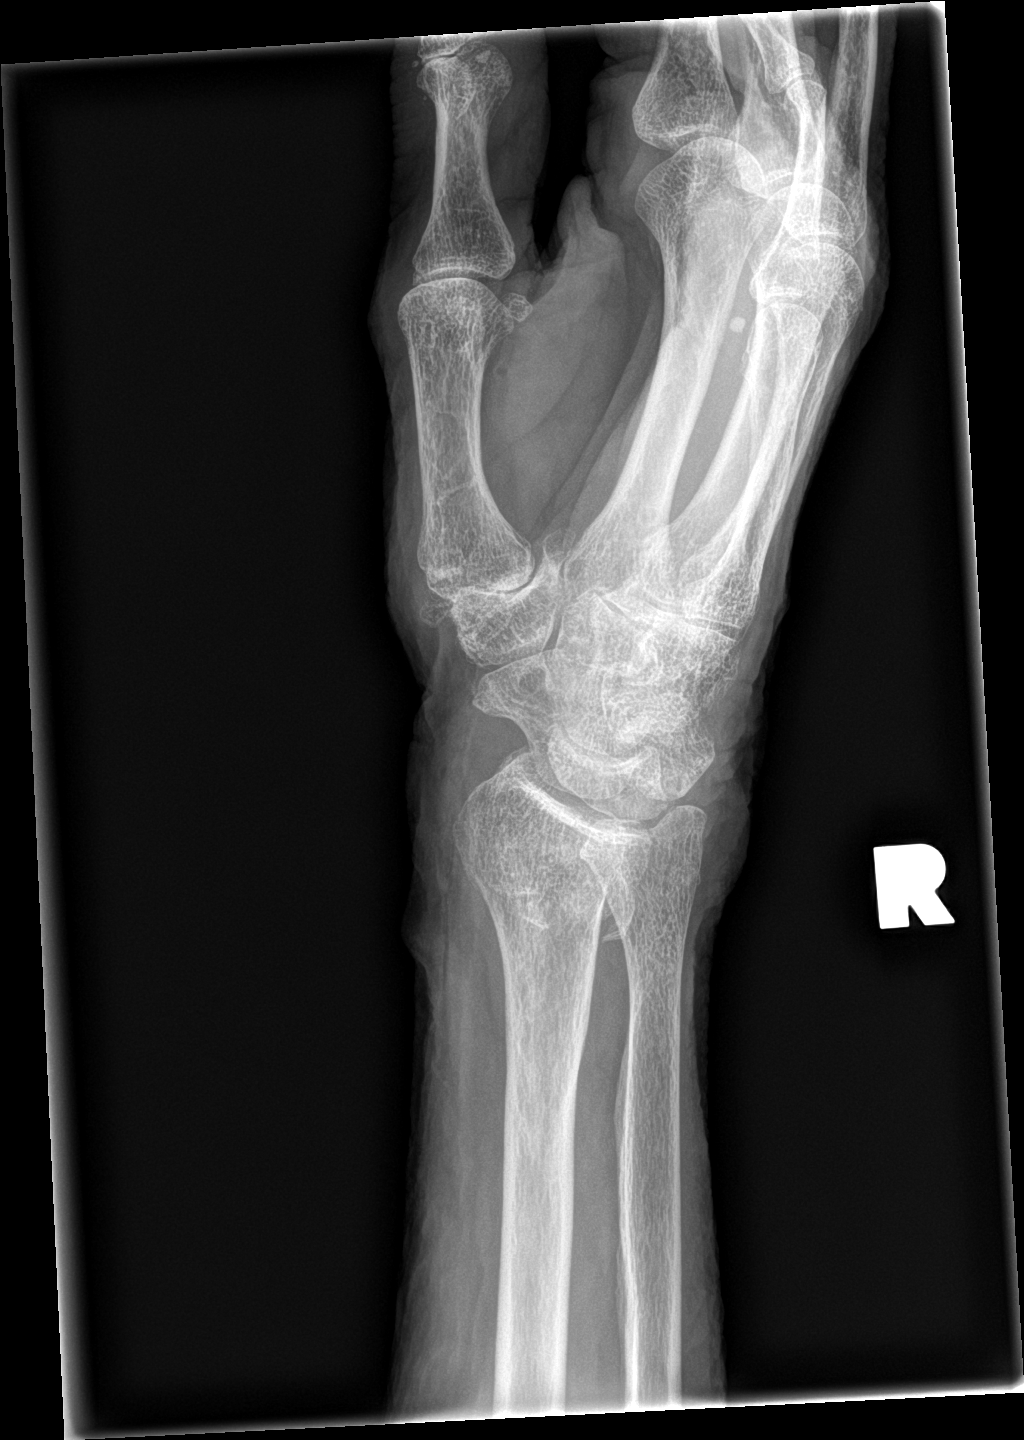
[im 3/4]
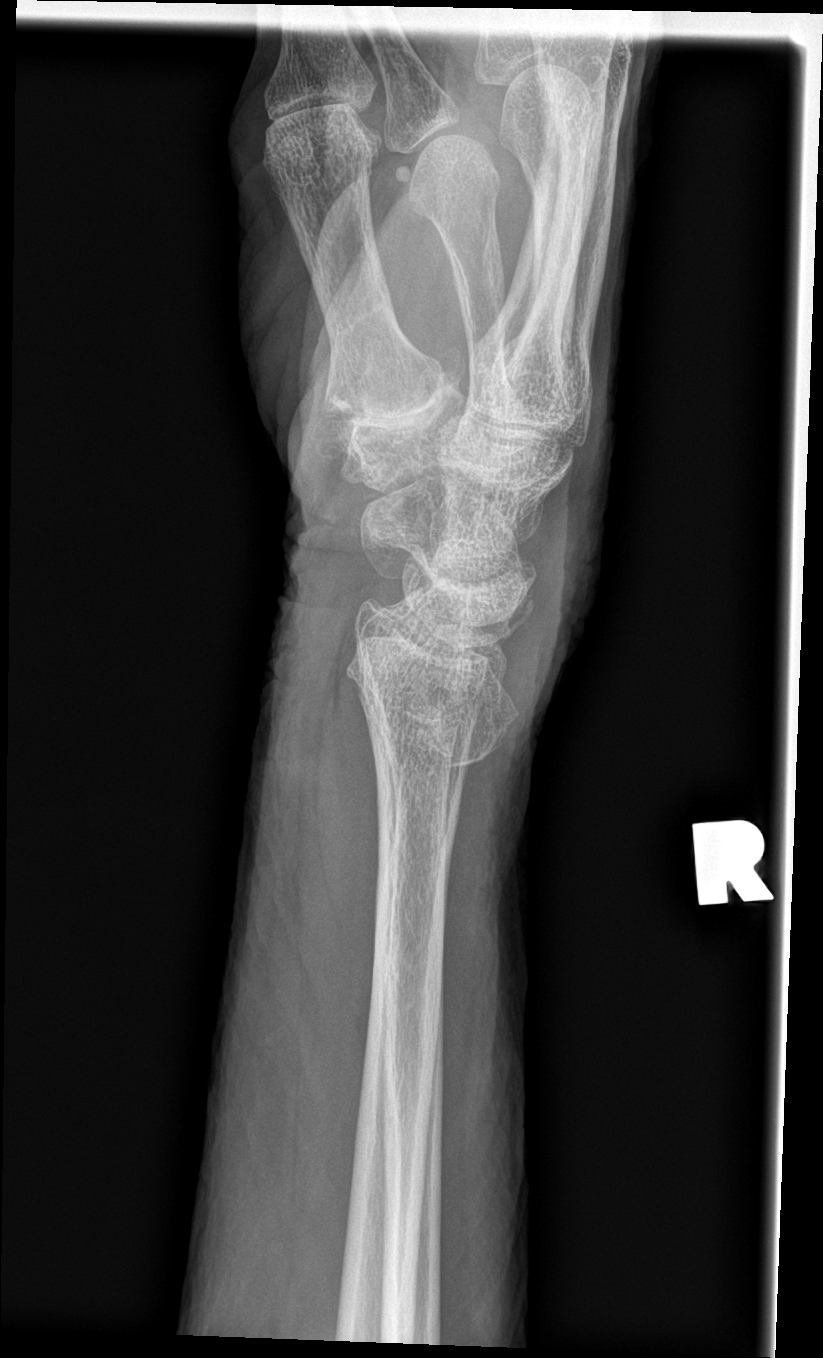
[im 4/4]
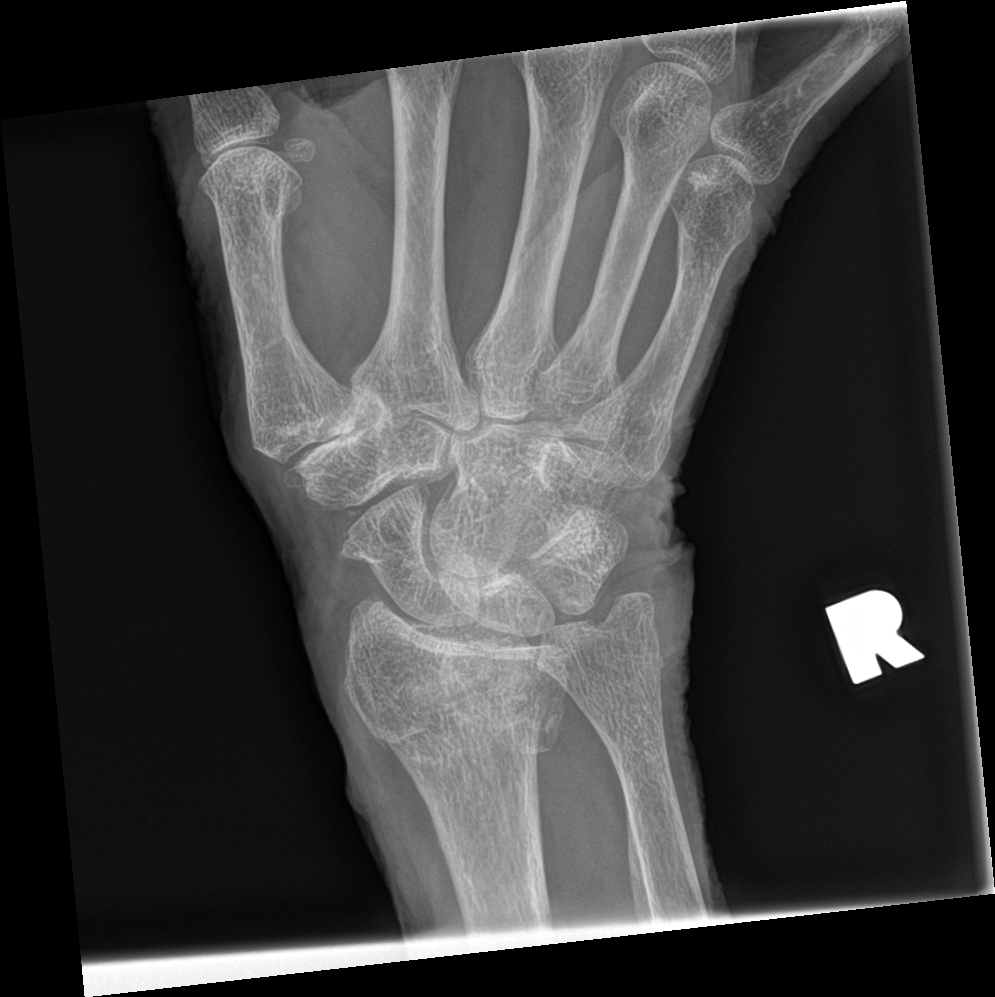

[4 of 4 positions shown; findings below may reference images not displayed]

FINDINGS: Comminuted distal right radius fracture with mild dorsal impaction.
LEN and radiocarpal joint involvement. Distal ulna and carpal bones
appear intact and aligned. Metacarpals intact. First CMC
osteoarthritis.
IMPRESSION: Comminuted distal right radius fracture with mild dorsal impaction.
LEN and radiocarpal joint involvement.

## 2023-10-08 ENCOUNTER — Other Ambulatory Visit: Payer: Self-pay | Admitting: Internal Medicine

## 2023-10-11 ENCOUNTER — Other Ambulatory Visit: Payer: Self-pay | Admitting: Internal Medicine

## 2023-10-13 ENCOUNTER — Ambulatory Visit (INDEPENDENT_AMBULATORY_CARE_PROVIDER_SITE_OTHER): Admitting: Physician Assistant

## 2023-10-13 ENCOUNTER — Encounter: Payer: Self-pay | Admitting: Physician Assistant

## 2023-10-13 ENCOUNTER — Telehealth: Payer: Self-pay

## 2023-10-13 VITALS — Ht 63.0 in | Wt 110.2 lb

## 2023-10-13 DIAGNOSIS — M810 Age-related osteoporosis without current pathological fracture: Secondary | ICD-10-CM | POA: Diagnosis not present

## 2023-10-13 NOTE — Progress Notes (Signed)
 Office Visit Note   Patient: Jacqueline Burgess           Date of Birth: 1941-02-09           MRN: [id number] Visit Date: 10/13/2023              Requested by: Charlett Apolinar POUR, MD [address] [place],  [place] [address] PCP: Charlett Apolinar POUR, MD   Assessment & Plan: Visit Diagnoses:  1. Age-related osteoporosis without current pathological fracture     Plan: Patient is a pleasant 83 year old Burgess who is referred by her primary care provider, Dr. Charlett for evaluation of osteoporosis.  She has had a history of wrist fracture and a pelvis fracture.  She has been on Fosamax in the past and Prolia .  She has had a rash she thinks with the Fosamax and she did have issues with her job with the Prolia .  She has no history of heart disease.  She has a history of skin cancer.  No history of kidney disease.  No history of ulcers.  She has no history of reflux no history of epilepsy.  She went through menopause at 25 and think she did about a year of hormone replacement therapy she takes 1 calcium and she takes a multivitamin a day.  She is not a smoker she drinks 5 alcoholic beverages a week she walks and does floor exercises she does have a history of dental issues not sure she has a history of Emily history of fragility fracture.  We talked about lifestyle changes including her vitamin D  which is decreased I have asked her to go up to 3000 international units a day for couple months.  We also talked about incorporating calcium and some weight training into her regular exercise.  I reviewed her chart for 45 minutes and talk to her and her son about medications as well as side effects and risks.  She has a FRAX score most notably of a increased risk 6.7% for hip fracture.  Unfortunately Prolia  she is not willing to take a chance on the side effects of Prolia  or Fosamax.  We did discuss Evenity and she says she thinks that has been talked to her before.  She is concerned about the Evenity and the  cardiac risk though she has no known cardiac risk I did give them information they would like to discuss this as a family she does not think she wants to do anything right now should have her vitamin D  monitored May contact me if she has any questions  Follow-Up Instructions: Return if symptoms worsen or fail to improve.   Orders:  No orders of the defined types were placed in this encounter.  No orders of the defined types were placed in this encounter.     Procedures: No procedures performed   Clinical Data: No additional findings.   Subjective: Chief Complaint  Patient presents with   Osteoporosis    HPI pleasant Jacqueline Burgess comes in with her son today discussion of osteoporosis  Review of Systems  All other systems reviewed and are negative.    Objective: Vital Signs: Ht 5' 3 (1.6 m)   Wt 110 lb 3.2 oz (50 kg)   BMI 19.52 kg/m   Physical Exam Constitutional:      Appearance: Normal appearance.  Pulmonary:     Effort: Pulmonary effort is normal.  Skin:    General: Skin is warm and dry.  Neurological:  General: No focal deficit present.     Mental Status: She is alert and oriented to person, place, and time.     Ortho Exam  Specialty Comments:  No specialty comments available.  Imaging: No results found.   PMFS History: Patient Active Problem List   Diagnosis Date Noted   Hearing deficit, bilateral 09/05/2023   Mild cognitive impairment 12/24/2016   Decreased hearing 11/10/2016   Ingrown toenail 10/06/2015   Bilateral bunions 01/08/2015   Metatarsalgia of both feet 01/08/2015   Wears hearing aid 03/17/2014   Palpitations 09/11/2013   Age-related osteoporosis without current pathological fracture 03/14/2013   High risk medication use 03/14/2013   Elevated lipids 03/14/2013   Postmenopausal atrophic vaginitis 09/17/2012   Bright red rectal bleeding 09/12/2012   Pain in joint, ankle and foot 06/28/2012   Idiopathic scoliosis  06/28/2012   Other and unspecified hyperlipidemia 04/02/2012   Hx of syncope    Urinary frequency 03/14/2012   Osteoporosis 02/06/2012   History of renal stone 02/06/2012   Abdominal discomfort 02/06/2012   Hx of fall    Hx of nonmelanoma skin cancer    Insomnia 02/03/2012   Arthritis    Colon polyps    Past Medical History:  Diagnosis Date   Arthritis    Cataract    lens implant   Closed pelvic fracture (HCC) 2012   pelvis and shoulder    Colon polyps    Hx of fall    fracture pelvis and left shoulder humerus   Hx of nonmelanoma skin cancer    ssca   Hx of syncope    had neg event monitor per dr cathlyn taylor in 2010   Hypercalciuria    pu on hctz per record   Kidney stones 1967   MVP (mitral valve prolapse)    in records  no echo eval in records   Osteoporosis    eval dr faythe in past dexa 11 12     Family History  Problem Relation Age of Onset   CVA Mother        Died in her 50s complications rheumatic  fever afterwards when she got a stroke   Rheumatic fever Mother    Lung cancer Sister        Died in 57s was a smoker   Thyroid  cancer Daughter        Papillary    Past Surgical History:  Procedure Laterality Date   CATARACT EXTRACTION     left   CHOLECYSTECTOMY  1999   KIDNEY STONE SURGERY  1967   MOHS SURGERY  2017   MULTIPLE TOOTH EXTRACTIONS Right 04/23/2023   daughter reports pt had 2 teeth extraction.   SHOULDER SURGERY  11/2010   Social History   Occupational History   Not on file  Tobacco Use   Smoking status: Never   Smokeless tobacco: Never  Vaping Use   Vaping status: Never Used  Substance and Sexual Activity   Alcohol use: Yes    Alcohol/week: 3.0 standard drinks of alcohol    Types: 3 Standard drinks or equivalent per week   Drug use: No   Sexual activity: Yes    Partners: Female

## 2023-10-13 NOTE — Telephone Encounter (Signed)
 Attempted to reach pt's son to update him on the FMLA forms that was fax over. Left a voicemail to call us  back.

## 2023-10-13 NOTE — Telephone Encounter (Signed)
 Copied from CRM [id number]. Topic: General - Other >> Oct 08, 2023 11:43 AM Franky GRADE wrote: Reason for CRM: Patient's son Garrel is calling to verify if FMLA documents were received by the office. [facility] should have faxed forms yesterday.

## 2023-10-14 NOTE — Telephone Encounter (Signed)
 Please see other phone encounter

## 2023-10-21 NOTE — Telephone Encounter (Signed)
 Spoke to pt's son, Jacqueline Burgess. Inform him of provider's message below. Pt states he can come by today to fill out the form. Inform son, he can let the front desk know that he is in for paper work. This cma can bring out the form and show him, which part he needs to fill out. Jacqueline Burgess verbalized understanding.

## 2023-10-21 NOTE — Telephone Encounter (Signed)
 Jacqueline Burgess came in and fill out the form. A copy was made at the front desk for [facility]. Form was faxed in successfully.

## 2023-10-21 NOTE — Telephone Encounter (Signed)
 Physician part completed and signed but [name] needs to complete his part .  To  your desk

## 2023-10-26 NOTE — Telephone Encounter (Signed)
 Form was faxed to [phone]  today with confirmation.   Follow up with Jacqueline Burgess and update him. He thanks for the follow up. Advise him to let us  know if he still has any problem. Jacqueline Burgess verbalized understanding. No further action is needed at this time.

## 2023-10-26 NOTE — Telephone Encounter (Unsigned)
 Copied from CRM #[id number]. Topic: Medical Record Request - Provider/Facility Request >> Oct 26, 2023 10:40 AM Arm[place] J wrote: Reason for CRM: Patient's son is calling to verify that the form was sent. I let him know that the CMA sent the documents to [facility] on the 14th of August.   He was wondering if we could re-fax the documents to [facility]  [phone] or [phone]) since they are claiming that the paperwork was not received. Also, please make sure that claim number is included ([id number] TC).

## 2023-12-11 ENCOUNTER — Other Ambulatory Visit: Payer: Self-pay | Admitting: Family

## 2023-12-12 ENCOUNTER — Encounter: Payer: Self-pay | Admitting: Internal Medicine

## 2023-12-13 ENCOUNTER — Other Ambulatory Visit: Payer: Self-pay | Admitting: Internal Medicine

## 2023-12-13 MED ORDER — CLONAZEPAM 0.5 MG PO TABS: ORAL_TABLET | ORAL | 0 refills | Status: DC

## 2023-12-13 NOTE — Telephone Encounter (Signed)
 Copied from CRM [id number]. Topic: Clinical - Medication Refill >> Dec 13, 2023  8:10 AM Zy'onna H wrote: Medication: clonazePAM  (KLONOPIN ) 0.5 MG tablet  Has the patient contacted their pharmacy? Yes (Agent: If no, request that the patient contact the pharmacy for the refill. If patient does not wish to contact the pharmacy document the reason why and proceed with request.) (Agent: If yes, when and what did the pharmacy advise?)  This is the patient's preferred pharmacy:  [facility] [address] [place] - [address] [place] [address] Phone: [phone] Fax: [phone]  Is this the correct pharmacy for this prescription? Yes If no, delete pharmacy and type the correct one.   Has the prescription been filled recently? Yes  Is the patient out of the medication? Yes  Has the patient been seen for an appointment in the last year OR does the patient have an upcoming appointment? Yes  Can we respond through MyChart? Yes  Agent: Please be advised that Rx refills may take up to 3 business days. We ask that you follow-up with your pharmacy.

## 2023-12-16 ENCOUNTER — Other Ambulatory Visit: Payer: Self-pay | Admitting: Family

## 2023-12-16 MED ORDER — CLONAZEPAM 0.5 MG PO TABS: ORAL_TABLET | ORAL | 0 refills | Status: DC

## 2023-12-22 ENCOUNTER — Ambulatory Visit (INDEPENDENT_AMBULATORY_CARE_PROVIDER_SITE_OTHER)

## 2023-12-22 DIAGNOSIS — Z23 Encounter for immunization: Secondary | ICD-10-CM | POA: Diagnosis not present
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
## 2023-12-26 ENCOUNTER — Other Ambulatory Visit: Payer: Self-pay | Admitting: Internal Medicine

## 2023-12-31 ENCOUNTER — Encounter: Payer: Self-pay | Admitting: Physician Assistant

## 2023-12-31 ENCOUNTER — Ambulatory Visit

## 2023-12-31 ENCOUNTER — Other Ambulatory Visit

## 2023-12-31 ENCOUNTER — Ambulatory Visit (INDEPENDENT_AMBULATORY_CARE_PROVIDER_SITE_OTHER): Admitting: Physician Assistant

## 2023-12-31 VITALS — BP 132/75 | HR 177 | Resp 18 | Ht 63.0 in | Wt 110.0 lb

## 2023-12-31 DIAGNOSIS — R413 Other amnesia: Secondary | ICD-10-CM

## 2023-12-31 DIAGNOSIS — E569 Vitamin deficiency, unspecified: Secondary | ICD-10-CM | POA: Diagnosis not present

## 2023-12-31 LAB — TSH: TSH: 1.07 m[IU]/L (ref 0.40–4.50)

## 2023-12-31 LAB — VITAMIN B12: Vitamin B-12: 268 pg/mL (ref 200–1100)

## 2023-12-31 NOTE — Patient Instructions (Addendum)
 It was a pleasure to see you today at our office.   Recommendations:  Neurocognitive evaluation at our office   MRI of the brain, the radiology office will call you to arrange you appointment   Check labs today   Follow up after the neurocognitive testing    [email or web address]   RECOMMENDATIONS FOR ALL PATIENTS WITH MEMORY PROBLEMS: 1. Continue to exercise (Recommend 30 minutes of walking everyday, or 3 hours every week) 2. Increase social interactions - continue going to [place] and enjoy social gatherings with friends and family 3. Eat healthy, avoid fried foods and eat more fruits and vegetables 4. Maintain adequate blood pressure, blood sugar, and blood cholesterol level. Reducing the risk of stroke and cardiovascular disease also helps promoting better memory. 5. Avoid stressful situations. Live a simple life and avoid aggravations. Organize your time and prepare for the next day in anticipation. 6. Sleep well, avoid any interruptions of sleep and avoid any distractions in the bedroom that may interfere with adequate sleep quality 7. Avoid sugar, avoid sweets as there is a strong link between excessive sugar intake, diabetes, and cognitive impairment We discussed the Mediterranean diet, which has been shown to help patients reduce the risk of progressive memory disorders and reduces cardiovascular risk. This includes eating fish, eat fruits and green leafy vegetables, nuts like almonds and hazelnuts, walnuts, and also use olive oil. Avoid fast foods and fried foods as much as possible. Avoid sweets and sugar as sugar use has been linked to worsening of memory function.  There is always a concern of gradual progression of memory problems. If this is the case, then we may need to adjust level of care according to patient needs. Support, both to the patient and caregiver, should then be put into place.         DRIVING: Regarding  driving, in patients with progressive memory problems, driving will be impaired. We advise to have someone else do the driving if trouble finding directions or if minor accidents are reported. Independent driving assessment is available to determine safety of driving.   If you are interested in the driving assessment, you can contact the following:  [employer] in [place] [phone]  [facility] [phone]  [facility] [phone]  [facility] [phone] or [phone]   FALL PRECAUTIONS: Be cautious when walking. Scan the area for obstacles that may increase the risk of trips and falls. When getting up in the mornings, sit up at the edge of the bed for a few minutes before getting out of bed. Consider elevating the bed at the head end to avoid drop of blood pressure when getting up. Walk always in a well-lit room (use night lights in the walls). Avoid area rugs or power cords from appliances in the middle of the walkways. Use a walker or a cane if necessary and consider physical therapy for balance exercise. Get your eyesight checked regularly.  FINANCIAL OVERSIGHT: Supervision, especially oversight when making financial decisions or transactions is also recommended.  HOME SAFETY: Consider the safety of the kitchen when operating appliances like stoves, microwave oven, and blender. Consider having supervision and share cooking responsibilities until no longer able to participate in those. Accidents with firearms and other hazards in the house should be identified and addressed as well.   ABILITY TO BE LEFT ALONE: If patient is unable to contact 911 operator, consider using LifeLine, or when the need is there, arrange for someone to stay with patients. Smoking is  a fire hazard, consider supervision or cessation. Risk of wandering should be assessed by caregiver and if detected at any point, supervision and safe proof recommendations  should be instituted.  MEDICATION SUPERVISION: Inability to self-administer medication needs to be constantly addressed. Implement a mechanism to ensure safe administration of the medications.      Mediterranean Diet A Mediterranean diet refers to food and lifestyle choices that are based on the traditions of countries located on the [employer]. This way of eating has been shown to help prevent certain conditions and improve outcomes for people who have chronic diseases, like kidney disease and heart disease. What are tips for following this plan? Lifestyle  Cook and eat meals together with your family, when possible. Drink enough fluid to keep your urine clear or pale yellow. Be physically active every day. This includes: Aerobic exercise like running or swimming. Leisure activities like gardening, walking, or housework. Get 7-8 hours of sleep each night. If recommended by your health care provider, drink red wine in moderation. This means 1 glass a day for nonpregnant women and 2 glasses a day for men. A glass of wine equals 5 oz (150 mL). Reading food labels  Check the serving size of packaged foods. For foods such as rice and pasta, the serving size refers to the amount of cooked product, not dry. Check the total fat in packaged foods. Avoid foods that have saturated fat or trans fats. Check the ingredients list for added sugars, such as corn syrup. Shopping  At the grocery store, buy most of your food from the areas near the walls of the store. This includes: Fresh fruits and vegetables (produce). Grains, beans, nuts, and seeds. Some of these may be available in unpackaged forms or large amounts (in bulk). Fresh seafood. Poultry and eggs. Low-fat dairy products. Buy whole ingredients instead of prepackaged foods. Buy fresh fruits and vegetables in-season from local farmers markets. Buy frozen fruits and vegetables in resealable bags. If you do not have access to quality  fresh seafood, buy precooked frozen shrimp or canned fish, such as tuna, salmon, or sardines. Buy small amounts of raw or cooked vegetables, salads, or olives from the deli or salad bar at your store. Stock your pantry so you always have certain foods on hand, such as olive oil, canned tuna, canned tomatoes, rice, pasta, and beans. Cooking  Cook foods with extra-virgin olive oil instead of using butter or other vegetable oils. Have meat as a side dish, and have vegetables or grains as your main dish. This means having meat in small portions or adding small amounts of meat to foods like pasta or stew. Use beans or vegetables instead of meat in common dishes like chili or lasagna. Experiment with different cooking methods. Try roasting or broiling vegetables instead of steaming or sauteing them. Add frozen vegetables to soups, stews, pasta, or rice. Add nuts or seeds for added healthy fat at each meal. You can add these to yogurt, salads, or vegetable dishes. Marinate fish or vegetables using olive oil, lemon juice, garlic, and fresh herbs. Meal planning  Plan to eat 1 vegetarian meal one day each week. Try to work up to 2 vegetarian meals, if possible. Eat seafood 2 or more times a week. Have healthy snacks readily available, such as: Vegetable sticks with hummus. Greek yogurt. Fruit and nut trail mix. Eat balanced meals throughout the week. This includes: Fruit: 2-3 servings a day Vegetables: 4-5 servings a day Low-fat dairy: 2 servings a  day Fish, poultry, or lean meat: 1 serving a day Beans and legumes: 2 or more servings a week Nuts and seeds: 1-2 servings a day Whole grains: 6-8 servings a day Extra-virgin olive oil: 3-4 servings a day Limit red meat and sweets to only a few servings a month What are my food choices? Mediterranean diet Recommended Grains: Whole-grain pasta. Brown rice. Bulgar wheat. Polenta. Couscous. Whole-wheat bread. [place] Madeira. Vegetables: Artichokes.  Beets. Broccoli. Cabbage. Carrots. Eggplant. Green beans. Chard. Kale. Spinach. Onions. Leeks. Peas. Squash. Tomatoes. Peppers. Radishes. Fruits: Apples. Apricots. Avocado. Berries. Bananas. Cherries. Dates. Figs. Grapes. Lemons. Melon. Oranges. Peaches. Plums. Pomegranate. Meats and other protein foods: Beans. Almonds. Sunflower seeds. Pine nuts. Peanuts. Cod. Salmon. Scallops. Shrimp. Tuna. Tilapia. Clams. Oysters. Eggs. Dairy: Low-fat milk. Cheese. Greek yogurt. Beverages: Water. Red wine. Herbal tea. Fats and oils: Extra virgin olive oil. Avocado oil. Grape seed oil. Sweets and desserts: [place] yogurt with honey. Baked apples. Poached pears. Trail mix. Seasoning and other foods: Basil. Cilantro. Coriander. Cumin. Mint. Parsley. Sage. Rosemary. Tarragon. Garlic. Oregano. Thyme. Pepper. Balsalmic vinegar. Tahini. Hummus. Tomato sauce. Olives. Mushrooms. Limit these Grains: Prepackaged pasta or rice dishes. Prepackaged cereal with added sugar. Vegetables: Deep fried potatoes (french fries). Fruits: Fruit canned in syrup. Meats and other protein foods: Beef. Pork. Lamb. Poultry with skin. Hot dogs. [place]. Dairy: Ice cream. Sour cream. Whole milk. Beverages: Juice. Sugar-sweetened soft drinks. Beer. Liquor and spirits. Fats and oils: Butter. Canola oil. Vegetable oil. Beef fat (tallow). Lard. Sweets and desserts: Cookies. Cakes. Pies. Candy. Seasoning and other foods: Mayonnaise. Premade sauces and marinades. The items listed may not be a complete list. Talk with your dietitian about what dietary choices are right for you. Summary The Mediterranean diet includes both food and lifestyle choices. Eat a variety of fresh fruits and vegetables, beans, nuts, seeds, and whole grains. Limit the amount of red meat and sweets that you eat. Talk with your health care provider about whether it is safe for you to drink red wine in moderation. This means 1 glass a day for nonpregnant women and 2 glasses a day  for men. A glass of wine equals 5 oz (150 mL). This information is not intended to replace advice given to you by your health care provider. Make sure you discuss any questions you have with your health care provider. Document Released: 10/17/2015 Document Revised: 11/19/2015 Document Reviewed: 10/17/2015 Elsevier Interactive Patient Education  2017 [employer].

## 2024-01-01 ENCOUNTER — Ambulatory Visit: Payer: Self-pay | Admitting: Physician Assistant

## 2024-01-03 DIAGNOSIS — R0789 Other chest pain: Secondary | ICD-10-CM | POA: Diagnosis not present

## 2024-01-03 DIAGNOSIS — M4125 Other idiopathic scoliosis, thoracolumbar region: Secondary | ICD-10-CM | POA: Diagnosis not present

## 2024-01-03 DIAGNOSIS — M4124 Other idiopathic scoliosis, thoracic region: Secondary | ICD-10-CM | POA: Diagnosis not present

## 2024-01-03 DIAGNOSIS — M545 Low back pain, unspecified: Secondary | ICD-10-CM | POA: Diagnosis not present

## 2024-01-03 DIAGNOSIS — M11262 Other chondrocalcinosis, left knee: Secondary | ICD-10-CM | POA: Diagnosis not present

## 2024-01-04 ENCOUNTER — Telehealth (INDEPENDENT_AMBULATORY_CARE_PROVIDER_SITE_OTHER): Admitting: Internal Medicine

## 2024-01-04 ENCOUNTER — Ambulatory Visit: Admitting: Internal Medicine

## 2024-01-04 VITALS — Wt 110.0 lb

## 2024-01-04 DIAGNOSIS — G4700 Insomnia, unspecified: Secondary | ICD-10-CM | POA: Diagnosis not present

## 2024-01-04 DIAGNOSIS — R7989 Other specified abnormal findings of blood chemistry: Secondary | ICD-10-CM | POA: Diagnosis not present

## 2024-01-04 DIAGNOSIS — Z79899 Other long term (current) drug therapy: Secondary | ICD-10-CM | POA: Diagnosis not present

## 2024-01-04 DIAGNOSIS — R0789 Other chest pain: Secondary | ICD-10-CM | POA: Diagnosis not present

## 2024-01-04 DIAGNOSIS — R413 Other amnesia: Secondary | ICD-10-CM

## 2024-01-04 MED ORDER — AMITRIPTYLINE HCL 10 MG PO TABS
10.0000 mg | ORAL_TABLET | Freq: Every day | ORAL | 1 refills | Status: AC
Start: 2024-01-04 — End: ?

## 2024-01-04 NOTE — Progress Notes (Addendum)
 Virtual Visit via Video Note  I connected with Jacqueline Burgess on 01/04/24 at  3:00 PM EDT by a video enabled telemedicine application and verified that I am speaking with the correct person using two identifiers. Location patient: home Location provider:work office Persons participating in the virtual visit: patient, provider daughter Rollo    Patient aware  of the limitations of evaluation and management by telemedicine and  availability of in person appointments. and agreed to proceed.   HPI: Jacqueline Burgess presents for video visit w daughter  Med check taking 1/2 clonazepam  0.5 hx   was out for  week or so  and had difficult nights  has some pain Saw  murphy wainter  for rright rib pain   no specific injury but patient feels had a slip and then pain alter on   R rib sore    thinks was  from slipping down steps   and then days later got sx  huurts to take a deep breath  but no  sob cough .   Daughter throught  back radiating to rib cage  poss from scoliosis .  No cough or sob.    Says tender to palpate area .  Under eval for  memory complaints and to have neuro cog eval in decameter .    ROS: See pertinent positives and negatives per HPI.  Past Medical History:  Diagnosis Date   Arthritis    Cataract    lens implant   Closed pelvic fracture (HCC) 03/09/2010   pelvis and shoulder    Colon polyps    Hx of fall    fracture pelvis and left shoulder humerus   Hx of nonmelanoma skin cancer    ssca   Hx of syncope    had neg event monitor per dr cathlyn taylor in 2010   Hypercalciuria    pu on hctz per record   Kidney stones 03/09/1965   MVP (mitral valve prolapse)    in records  no echo eval in records   Osteoporosis    eval dr faythe in past dexa 11 12     Past Surgical History:  Procedure Laterality Date   CATARACT EXTRACTION     left   CHOLECYSTECTOMY  1999   KIDNEY STONE SURGERY  1967   MOHS SURGERY  2017   MULTIPLE TOOTH EXTRACTIONS Right 04/23/2023    daughter reports pt had 2 teeth extraction.   SHOULDER SURGERY  11/2010    Family History  Problem Relation Age of Onset   CVA Mother        Died in her 40s complications rheumatic  fever afterwards when she got a stroke   Rheumatic fever Mother    Lung cancer Sister        Died in 74s was a smoker   Thyroid  cancer Daughter        Papillary    Social History   Tobacco Use   Smoking status: Never   Smokeless tobacco: Never  Vaping Use   Vaping status: Never Used  Substance Use Topics   Alcohol use: Yes    Alcohol/week: 3.0 standard drinks of alcohol    Types: 3 Standard drinks or equivalent per week   Drug use: No      Current Outpatient Medications:    Acetaminophen (TYLENOL PO), Take by mouth as needed., Disp: , Rfl:    Calcium Carbonate-Vitamin D  (CALCIUM-VITAMIN D ) 500-200 MG-UNIT per tablet, Take 1 tablet by mouth 2 (two)  times daily with a meal. Reported on 09/27/2015, Disp: , Rfl:    clonazePAM  (KLONOPIN ) 0.5 MG tablet, TAKE 1/2 (ONE-HALF) TABLET BY MOUTH AT BEDTIME AS NEEDED .  Wean as possible, Disp: 15 tablet, Rfl: 0   meloxicam (MOBIC) 7.5 MG tablet, Take 7.5 mg by mouth daily., Disp: , Rfl:    Multiple Vitamins-Minerals (MULTIVITAL) tablet, Take 1 tablet by mouth daily. Reported on 09/27/2015, Disp: , Rfl:    tretinoin (RETIN-A) 0.025 % cream, Reported on 09/27/2015, Disp: , Rfl:    amitriptyline  (ELAVIL ) 10 MG tablet, Take 1 tablet (10 mg total) by mouth at bedtime., Disp: 90 tablet, Rfl: 1  EXAM: BP Readings from Last 3 Encounters:  12/31/23 132/75  09/01/23 118/62  03/16/23 (!) 150/80    VITALS per patient if applicable:  GENERAL: alert,  appears well and in no acute distress  HEENT: atraumatic, conjunttiva clear, no obvious abnormalities on inspection of external nose and ears  NECK: normal movements of the head and neck  LUNGS: on inspection no signs of respiratory distress, breathing rate appears normal, no obvious gross SOB, gasping or  wheezing  CV: no obvious cyanosis  MS: moves all visible extremities without noticeable abnormality  PSYCH/NEURO: pleasant and cooperative, no obvious depression or anxiety, speech and thought processing grossly intact Lab Results  Component Value Date   VITAMINB12 268 12/31/2023     ASSESSMENT AND PLAN:  Discussed the following assessment and plan:    ICD-10-CM   1. Pain in rib  R07.89     2. Medication management  Z79.899     3. Insomnia, unspecified type  G47.00     4. High risk medication use  Z79.899     5. Mild memory disturbance  R41.3    under evaluation  update    6. 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  6 week update   and visit if needed   Add otc 1000 mcg vit b12 level Proceed with neuro eval.  Try 1/4 clonipen  0.5     1/2 if needed.  In future may try 5 mg amitriptyline  instead of 10  Counseled.  Fu 3 mos   Expectant management and discussion of plan and treatment with opportunity to ask questions and all were answered. The patient agreed with the plan and demonstrated an understanding of the instructions.   Advised to call back or seek an in-person evaluation if worsening  or having  further concerns  in interim. Return in about 3 months (around 04/05/2024), or if symptoms worsen or fail to improve as expected.    Apolinar Eastern, MD

## 2024-01-05 ENCOUNTER — Ambulatory Visit: Admitting: Podiatry

## 2024-01-10 ENCOUNTER — Encounter: Payer: Self-pay | Admitting: Radiology

## 2024-01-18 NOTE — Telephone Encounter (Signed)
 Ok   does she need a refill?  Now?

## 2024-01-19 MED ORDER — CLONAZEPAM 0.5 MG PO TABS: ORAL_TABLET | ORAL | 1 refills | Status: DC

## 2024-01-25 NOTE — Telephone Encounter (Signed)
 Rx was sent on 01/19/2024.

## 2024-02-10 ENCOUNTER — Encounter: Payer: Self-pay | Admitting: Physician Assistant

## 2024-02-14 ENCOUNTER — Inpatient Hospital Stay
Admission: RE | Admit: 2024-02-14 | Discharge: 2024-02-14 | Attending: Physician Assistant | Admitting: Physician Assistant

## 2024-02-22 ENCOUNTER — Ambulatory Visit: Payer: Self-pay | Admitting: Psychology

## 2024-02-22 ENCOUNTER — Encounter: Payer: Self-pay | Admitting: Psychology

## 2024-02-22 ENCOUNTER — Other Ambulatory Visit: Payer: Self-pay | Admitting: Internal Medicine

## 2024-02-22 DIAGNOSIS — F04 Amnestic disorder due to known physiological condition: Secondary | ICD-10-CM | POA: Diagnosis not present

## 2024-02-22 DIAGNOSIS — M545 Low back pain, unspecified: Secondary | ICD-10-CM | POA: Insufficient documentation

## 2024-02-22 DIAGNOSIS — G309 Alzheimer's disease, unspecified: Secondary | ICD-10-CM | POA: Diagnosis not present

## 2024-02-22 DIAGNOSIS — F0280 Dementia in other diseases classified elsewhere without behavioral disturbance: Secondary | ICD-10-CM | POA: Diagnosis not present

## 2024-02-22 DIAGNOSIS — F0150 Vascular dementia without behavioral disturbance: Secondary | ICD-10-CM | POA: Diagnosis not present

## 2024-02-22 DIAGNOSIS — M4125 Other idiopathic scoliosis, thoracolumbar region: Secondary | ICD-10-CM | POA: Insufficient documentation

## 2024-02-22 DIAGNOSIS — M11262 Other chondrocalcinosis, left knee: Secondary | ICD-10-CM | POA: Insufficient documentation

## 2024-02-22 DIAGNOSIS — R4189 Other symptoms and signs involving cognitive functions and awareness: Secondary | ICD-10-CM

## 2024-02-22 NOTE — Progress Notes (Signed)
° °  Psychometrician Note   Cognitive testing was administered to Jacqueline Burgess by Lonell Jude, B.S. (psychometrist) under the supervision of Dr. Arthea KYM Maryland, Ph.D., ABPP, licensed psychologist on 02/22/2024. Jacqueline Burgess did not appear overtly distressed by the testing session per behavioral observation or responses across self-report questionnaires. Rest breaks were offered.   The battery of tests administered was selected by Dr. Zachary C. Merz, Ph.D., ABPP with consideration to Jacqueline Burgess's current level of functioning, the nature of her symptoms, emotional and behavioral responses during interview, level of literacy, observed level of motivation/effort, and the nature of the referral question. This battery was communicated to the psychometrist. Communication between Dr. Arthea KYM Maryland, Ph.D., ABPP and the psychometrist was ongoing throughout the evaluation and Dr. Arthea KYM Maryland, Ph.D., ABPP was immediately accessible at all times. Dr. Zachary C. Merz, Ph.D., ABPP provided supervision to the psychometrist on the date of this service to the extent necessary to assure the quality of all services provided.    Jacqueline Burgess will return within approximately 1-2 weeks for an interactive feedback session with Dr. Maryland at which time her test performances, clinical impressions, and treatment recommendations will be reviewed in detail. Jacqueline Burgess understands she can contact our office should she require our assistance before this time.  A total of 135 minutes of billable time were spent face-to-face with Jacqueline Burgess by the psychometrist. This includes both test administration and scoring time. Billing for these services is reflected in the clinical report generated by Dr. Arthea KYM Maryland, Ph.D., ABPP  This note reflects time spent with the psychometrician and does not include test scores or any clinical interpretations made by Dr. Maryland. The full report will follow in a separate note.

## 2024-02-22 NOTE — Progress Notes (Unsigned)
 NEUROPSYCHOLOGICAL EVALUATION [place] Department of Neurology  Date of Evaluation: February 22, 2024  Reason for Referral:   Jacqueline Burgess is a 83 y.o. right-handed Caucasian female referred by Camie Sevin, PA-C, to characterize her current cognitive functioning and assist with diagnostic clarity and treatment planning in the context of subjective cognitive decline.   Assessment and Plan:   Clinical Impression(s): Jacqueline Burgess's pattern of performance is suggestive of severe impairment surrounding both delayed retrieval and recognition/consolidation aspects of memory. Further weakness/variability was exhibited across processing speed, executive functioning, semantic fluency, confrontation naming, and encoding (i.e., learning) aspects of memory. Outside of an isolated impairment across a line orientation task, visuospatial abilities were appropriate. Performances were also appropriate relative to age-matched peers across attention/concentration, safety/judgment, receptive language, and phonemic fluency. Functionally, Jacqueline Burgess denied difficulties completing instrumental activities of daily living (ADLs) independently. However, medical records suggest that her son serves as her caretaker and that family is involved in medication and financial management. Jacqueline Burgess also does not drive. Given evidence for cognitive dysfunction described above and the likelihood that this is directly interfering with aspects of daily living, she best meets diagnostic criteria for a Major Neurocognitive Disorder (dementia) at the present time.  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 Across memory testing, Jacqueline Burgess was fully amnestic (i.e., 0% retention) after brief delays and performed very poorly across follow-up recognition trials. This suggests evidence for rapid forgetting and a prominent storage  impairment, both of which are the hallmark testing patterns associated with this illness. Further weakness surrounding semantic fluency, confrontation naming, and executive functioning could represent a fairly typical pattern of disease progression. Unfortunately, current memory performances are above and beyond what would be expected for the typical aging process. While hearing loss may play a role in her clinical presentation, it cannot explain current memory patterns.   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 While the location of this stroke could indeed impact memory abilities, it's right-sided location is not a sufficient explanation for severe verbal memory impairment assuming normal brain lateralization of this right-handed individual. 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 Continued medical monitoring will be important moving forward.   Recommendations: Ms. Kapusta should discuss medication aimed to address memory loss and concerns surrounding Alzheimer's disease with Jacqueline Burgess. It is important to highlight that these medications have been shown to slow functional decline in some individuals. There is no current treatment which can stop or reverse cognitive decline when caused by a neurodegenerative illness.   Performance across neurocognitive testing is not a strong predictor of an individual's safety operating a motor vehicle. Should her family wish to pursue a formalized driving evaluation, they could reach out to the following agencies: [employer] in [place]: [phone] [facility]: [phone] [facility]: [phone] [facility]: [phone] or [phone]  Should there be progression of current deficits over time, Jacqueline Burgess is unlikely to regain any independent living skills lost. Therefore,  it is recommended that she remain as involved as possible in all aspects of household chores, finances, and medication management, with supervision to ensure adequate performance. She will likely benefit from the establishment and maintenance of a routine in order to maximize her functional abilities over time.  It will be important for Jacqueline Burgess to have another person with her  when in situations where she may need to process information, weigh the pros and cons of different options, and make decisions, in order to ensure that she fully understands and recalls all information to be considered.  If not already done, Jacqueline Burgess and her family may want to discuss her wishes regarding durable power of attorney and medical decision making, so that she can have input into these choices. If they require legal assistance with this, long-term care resource access, or other aspects of estate planning, they could reach out to [employer] at [phone] for a free consultation. Additionally, they may wish to discuss future plans for caretaking and seek out community options for in home/residential care should they become necessary.  Jacqueline Burgess is encouraged to attend to lifestyle factors for brain health (e.g., regular physical exercise, good nutrition habits and consideration of the MIND-DASH diet, regular participation in cognitively-stimulating activities, and general stress management techniques), which are likely to have benefits for both emotional adjustment and cognition. Optimal control of vascular risk factors (including safe cardiovascular exercise and adherence to dietary recommendations) is encouraged. Continued participation in activities which provide mental stimulation and social interaction is also recommended.   Important information should be provided to Jacqueline Burgess in written format in all instances. This information should be placed in a highly frequented and easily visible location within her  home to promote recall. External strategies such as written notes in a consistently used memory journal, visual and nonverbal auditory cues such as a calendar on the refrigerator or appointments with alarm, such as on a cell phone, can also help maximize recall.  To address problems with processing speed, she may wish to consider:   -Ensuring that she is alerted when essential material or instructions are being presented   -Adjusting the speed at which new information is presented   -Allowing for more time in comprehending, processing, and responding in conversation   -Repeating and paraphrasing instructions or conversations aloud  To address problems with fluctuating attention and/or executive dysfunction, she may wish to consider:   -Avoiding external distractions when needing to concentrate   -Limiting exposure to fast paced environments with multiple sensory demands   -Writing down complicated information and using checklists   -Attempting and completing one task at a time (i.e., no multi-tasking)   -Verbalizing aloud each step of a task to maintain focus   -Taking frequent breaks during the completion of steps/tasks to avoid fatigue   -Reducing the amount of information considered at one time   -Scheduling more difficult activities for a time of day where she is usually most alert  Review of Records:   Past Medical History:  Diagnosis Date   Arthritis    Bilateral bunions 01/08/2015   RT with significant valgus shift  LT with mod valgus shift     Bright red rectal bleeding 09/12/2012   Cataract    lens implant   Chondrocalcinosis of left knee    Closed pelvic fracture 03/09/2010   Colon polyps    Hearing deficit, bilateral    History of nonmelanoma skin cancer    Hx of fall    fracture pelvis and left shoulder humerus   Hx of nonmelanoma skin cancer    ssca   Hx of syncope    had neg event monitor per dr cathlyn taylor in 2010   Hypercalciuria    pu on hctz per record    Hyperlipidemia 04/02/2012   ldl 140 range   ratio 3  Idiopathic scoliosis of thoracolumbar spine    Ingrown toenail 10/06/2015   Insomnia 02/03/2012   Has apparently been on low-dose Klonopin  and amitriptyline  10 mg at night for a number of years denies significant side effects and doesn't think that's related to history of fall a year ago. Discussed caution with these medications as they can have risk.     Kidney stones 03/09/1965   Low back pain    Metatarsalgia of both feet 01/08/2015   Present bilaterally - worse on side with larger bunion RT     MVP (mitral valve prolapse)    in records  no echo eval in records   Osteoporosis 02/06/2012   By hx of fracture after fall pelvis and shoulder humerus. 2012 Hx fosamax in past ? How long? stopped cause of concern about potential side effects   t score 11 12 -3.5 forearm     Pain in joint, ankle and foot 06/28/2012   Mrs. primarily limited to the forefoot bilaterally and sometimes great toe     Palpitations 09/11/2013   hx of same in past  recently again but shor lasting 10 minutes and good exercise tolerance and seems regular  rate per pat more like a tachy      Postmenopausal atrophic vaginitis 09/17/2012   premarin      Rib pain 01/03/2024   Urinary frequency 03/14/2012   Wears hearing aid 03/17/2014    Past Surgical History:  Procedure Laterality Date   CATARACT EXTRACTION     left   CHOLECYSTECTOMY  1999   KIDNEY STONE SURGERY  1967   MOHS SURGERY  2017   MULTIPLE TOOTH EXTRACTIONS Right 04/23/2023   daughter reports pt had 2 teeth extraction.   SHOULDER SURGERY  11/2010    Current Outpatient Medications:    Acetaminophen (TYLENOL PO), Take by mouth as needed., Disp: , Rfl:    amitriptyline  (ELAVIL ) 10 MG tablet, Take 1 tablet (10 mg total) by mouth at bedtime., Disp: 90 tablet, Rfl: 1   Calcium Carbonate-Vitamin D  (CALCIUM-VITAMIN D ) 500-200 MG-UNIT per tablet, Take 1 tablet by mouth 2 (two) times daily with a meal.  Reported on 09/27/2015, Disp: , Rfl:    clonazePAM  (KLONOPIN ) 0.5 MG tablet, TAKE 1/2 (ONE-HALF) TABLET BY MOUTH AT BEDTIME AS NEEDED ., Disp: 30 tablet, Rfl: 1   meloxicam (MOBIC) 7.5 MG tablet, Take 7.5 mg by mouth daily., Disp: , Rfl:    Multiple Vitamins-Minerals (MULTIVITAL) tablet, Take 1 tablet by mouth daily. Reported on 09/27/2015, Disp: , Rfl:    tretinoin (RETIN-A) 0.025 % cream, Reported on 09/27/2015, Disp: , Rfl:      06/24/2017   11:00 AM 12/24/2016   11:00 AM  MMSE - Mini Mental State Exam  Orientation to time 3 2   Orientation to Place 5 4   Registration 3 3   Attention/ Calculation 5 5   Recall 2 1   Language- name 2 objects 2 2   Language- repeat 1 1  Language- follow 3 step command 3 3   Language- read & follow direction 1 1   Write a sentence 1 1   Copy design 1 1   Total score 27 24        12/31/2023    1:00 PM 07/03/2016    1:00 PM  Montreal Cognitive Assessment   Visuospatial/ Executive (0/5) 4 5  Naming (0/3) 2 3  Attention: Read list of digits (0/2) 2 2  Attention: Read list of letters (0/1) 1 1  Attention: Serial 7 subtraction starting at 100 (0/3) 3 3  Language: Repeat phrase (0/2) 1 2  Language : Fluency (0/1) 1 1  Abstraction (0/2) 0 2  Delayed Recall (0/5) 2 0  Orientation (0/6) 1 4  Total 17 23  Adjusted Score (based on education) 17    Neuroimaging: Brain MRI on 07/15/2016 suggested mild generalized atrophy, moderate microvascular ischemic disease, and a remote right medial temporal lobe infarct with asymmetric volume loss and gliosis. Brain MRI on 02/14/2024 was stable.  Clinical Interview:   The following information was obtained during a clinical interview with Jacqueline Burgess prior to cognitive testing.  Cognitive Symptoms: Decreased short-term memory: Endorsed. She described generalized short-term memory dysfunction. She largely denied concerns when provided specific examples such as trouble recalling names or details of conversations. Memory  dysfunction was downplayed as she noted always taking extensive notes due to a fear that she might forget something.  Decreased long-term memory: Denied. Decreased attention/concentration: Denied. Reduced processing speed: Denied. Difficulties with executive functions: Denied. She also denied trouble with impulsivity. No significant personality changes were reported.  Difficulties with emotion regulation: Denied. Difficulties with receptive language: Denied. Difficulties with word finding: Denied. Decreased visuoperceptual ability: Denied.  Difficulties completing ADLs: Jacqueline Burgess denied all concerns. However, medical records from her PCP visit in July 2024 suggested that her son serves as her caretaker and that she requires assistance with all instrumental ADLs. Her driving history was difficult to determine. She commented that she has not driven since fracturing her wrist. She was unable to determine when this took place. It was noteworthy that the soft cast she was wearing on her wrist was extremely loose and often in danger of simply slipping or falling off and very unlikely to be providing any current support. Medical records suggest a right wrist fracture in December 2022. I was unable to locate any more recent concerns.   Additional Medical History: History of traumatic brain injury/concussion: Denied. History of stroke: See neuroimaging above.  History of seizure activity: Denied. History of known exposure to toxins: Denied. Symptoms of chronic pain: Denied. Experience of frequent headaches/migraines: Denied. Frequent instances of dizziness/vertigo: Denied.  Sensory changes: She utilize glasses and hearing aids with benefit.  Balance/coordination difficulties: Denied. She also denied any recent falls. Other motor difficulties: Denied.  Sleep History: Estimated hours obtained each night: 8 hours.  Difficulties falling asleep: Denied. Difficulties staying asleep: Denied. Feels  rested and refreshed upon awakening: Endorsed.  History of snoring: Denied. History of waking up gasping for air: Denied. Witnessed breath cessation while asleep: Denied.  History of vivid dreaming: Denied. Excessive movement while asleep: Denied. Instances of acting out her dreams: Denied.  Psychiatric/Behavioral Health History: Depression: She described her current mood as good and upbeat. She denied to her knowledge any previous mental health concerns or formal diagnoses. Current or remote suicidal ideation, intent, or plan was denied.  Anxiety: Denied. Mania: Denied. Trauma History: Denied. Visual/auditory hallucinations: Denied. Delusional thoughts: Denied.  Tobacco: Denied. Alcohol: She reported consuming one glass of wine nightly with dinner and denied a history of problematic alcohol abuse or dependence.  Recreational drugs: Denied.  Family History: Problem Relation Age of Onset   CVA Mother        Died in her 3s complications rheumatic  fever afterwards when she got a stroke   Rheumatic fever Mother    Lung cancer Sister        Died in 62s was a smoker   Thyroid  cancer Daughter  Papillary   This information was confirmed by Jacqueline Burgess.  Academic/Vocational History: Highest level of educational attainment: 16 years. She graduated from high school and earned a Oncologist in education. 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 No relative weaknesses were identified.  History of developmental delay: Denied. History of grade repetition: Denied. Enrollment in special education courses: Denied. History of LD/ADHD: Denied.  Employment: Retired.   Evaluation Results:   Behavioral Observations: Jacqueline Burgess was unaccompanied, arrived to her appointment on time, and was appropriately dressed and groomed. She appeared alert. Observed gait and station were within normal limits. Gross motor functioning appeared intact upon informal  observation and no abnormal movements (e.g., tremors) were noted. Her affect was generally relaxed and positive. Spontaneous speech was fluent and word finding difficulties were not observed during the clinical interview. Thought processes were coherent, organized, and normal in content. Insight into her cognitive difficulties appeared poor and I do not believe Jacqueline Burgess has adequate insight into the extent of cognitive impairment, especially with regard to memory capabilities.   During testing, sustained attention was appropriate. Task engagement was adequate and she persisted when challenged. She was quite repetitive in conversation, often making the same statements within a short period of time. She did not appear to recognize said repetition. Overall, Jacqueline Burgess was cooperative with the clinical interview and subsequent testing procedures.   Adequacy of Effort: The validity of neuropsychological testing is limited by the extent to which the individual being tested may be assumed to have exerted adequate effort during testing. Jacqueline Burgess expressed her intention to perform to the best of her abilities and exhibited adequate task engagement and persistence. Scores across stand-alone and embedded performance validity measures were within expectation. As such, the results of the current evaluation are believed to be a valid representation of Ms. Hodapp's current cognitive functioning.  Test Results: Ms. Soule was very disoriented at the time of the current evaluation. She was unable to state her age or phone number. She was also unable to state the year (2021), date, day of the week, or name of the current clinic.   Intellectual abilities based upon educational and vocational attainment were estimated to be in the average range. Premorbid abilities were estimated to be within the above average range based upon a single-word reading test.   Processing speed was variable, ranging from the well below  average to average normative ranges. Basic attention was average to above average. More complex attention (e.g., working memory) was average. Executive functioning was variable, ranging from the well below average to average normative ranges. She performed in the average range across a task assessing safety and judgment.  Assessed receptive language abilities were average. Likewise, Ms. Walter did not exhibit prominent difficulties comprehending task instructions when she was able to hear adequately and answered questions asked of her appropriately. Assessed expressive language was variable. Phonemic fluency was average, semantic fluency was well below average to above average, and confrontation naming was average across a screening task but well below average across a more comprehensive task.   Assessed visuospatial/visuoconstructional abilities were average to above average outside an isolated impairment across a line orientation task.    Learning (i.e., encoding) of novel verbal information was variable, ranging from the well below average to average normative ranges. Spontaneous delayed recall (i.e., retrieval) of previously learned information was exceptionally low. Retention rates were 0% across a list learning task, 0% across a story learning task, and 0% across a  figure drawing task. Performance across recognition tasks was exceptionally low to below average, suggesting negligible evidence for information consolidation.   Results of emotional screening instruments suggested that recent symptoms of generalized anxiety were in the minimal range, while symptoms of depression were within normal limits. A screening instrument assessing recent sleep quality suggested the presence of minimal sleep dysfunction.  Table of Scores:   Note: This summary of test scores accompanies the interpretive report and should not be considered in isolation without reference to the appropriate sections in the text.  Descriptors are based on appropriate normative data and may be adjusted based on clinical judgment. Terms such as Within Normal Limits and Outside Normal Limits are used when a more specific description of the test score cannot be determined.       Percentile - Normative Descriptor > 98 - Exceptionally High 91-97 - Well Above Average 75-90 - Above Average 25-74 - Average 9-24 - Below Average 2-8 - Well Below Average < 2 - Exceptionally Low       Validity:   DESCRIPTOR       DCT: --- --- Within Normal Limits  RBANS EI: --- --- Within Normal Limits       Orientation:      Raw Score Percentile   NAB Orientation, Form 1 17/29 --- ---       Cognitive Screening:      Raw Score Percentile   SLUMS: 15/30 --- ---       RBANS, Form A: Standard Score/ Scaled Score Percentile   Total Score 78 7 Well Below Average  Immediate Memory 78 7 Well Below Average    List Learning 4 2 Well Below Average    Story Memory 8 25 Average  Visuospatial/Constructional 92 30 Average    Figure Copy 12 75 Above Average    Line Orientation 12/20 3-9 Well Below Average  Language 92 30 Average    Picture Naming 9/10 26-50 Average    Semantic Fluency 6 9 Below Average  Attention 103 58 Average    Digit Span 12 75 Above Average    Coding 9 37 Average  Delayed Memory 48 <1 Exceptionally Low    List Recall 0/10 <2 Exceptionally Low    List Recognition 14/20 <2 Exceptionally Low    Story Recall 1 <1 Exceptionally Low    Story Recognition 7/12 7-13 Below Average    Figure Recall 1 <1 Exceptionally Low    Figure Recognition 1/8 <1 Exceptionally Low        Intellectual Functioning:      Standard Score Percentile   Test of Premorbid Functioning: 119 90 Above Average       Attention/Executive Function:     Trail Making Test (TMT): Raw Score (T Score) Percentile     Part A 66 secs.,  0 errors (33) 5 Well Below Average    Part B 195 secs.,  3 errors (37) 9 Below Average         Scaled Score Percentile    WAIS-5 Coding: 9 37 Average  WAIS-5 Naming Speed Quantity: 10 50 Average        Scaled Score Percentile   WAIS-5 Digits Forwards: 10 50 Average  WAIS-5 Digit Sequencing: 10 50 Average        Scaled Score Percentile   WAIS-5 Similarities: 12 75 Above Average  WAIS-5 Figure Weights: 11 63 Average       D-KEFS Verbal Fluency Test: Raw Score (Scaled Score) Percentile  Letter Total Correct 34 (11) 63 Average    Category Total Correct 34 (12) 75 Above Average    Category Switching Total Correct 6 (5) 5 Well Below Average    Category Switching Accuracy 3 (4) 2 Well Below Average      Total Set Loss Errors 5 (6) 9 Below Average      Total Repetition Errors 7 (7) 16 Below Average       NAB Executive Functions Module, Form 1: T Score Percentile     Judgment 50 50 Average       Language:     Verbal Fluency Test: Raw Score (T Score) Percentile     Phonemic Fluency (FAS) 34 (44) 27 Average    Animal Fluency 13 (36) 8 Well Below Average        NAB Language Module, Form 1: T Score Percentile     Auditory Comprehension 55 69 Average    Naming 24/31 (30) 2 Well Below Average       Visuospatial/Visuoconstruction:      Raw Score Percentile   Clock Drawing: 10/10 --- Within Normal Limits        Scaled Score Percentile   WAIS-5 Block Design: 10 50 Average       Mood and Personality:      Raw Score Percentile   Geriatric Depression Scale: 1 --- Within Normal Limits  Geriatric Anxiety Scale: 6 --- Minimal    Somatic 3 --- Minimal    Cognitive 2 --- Minimal    Affective 1 --- Minimal       Additional Questionnaires:      Raw Score Percentile   PROMIS Sleep Disturbance Questionnaire: 15 --- None to Slight   Informed Consent and Coding/Compliance:   The current evaluation represents a clinical evaluation for the purposes previously outlined by the referral source and is in no way reflective of a forensic evaluation.   Ms. Huseby was provided with a verbal description of the nature  and purpose of the present neuropsychological evaluation. Also reviewed were the foreseeable risks and/or discomforts and benefits of the procedure, limits of confidentiality, and mandatory reporting requirements of this provider. The patient was given the opportunity to ask questions and receive answers about the evaluation. Oral consent to participate was provided by the patient.   This evaluation was conducted by Arthea KYM Maryland, Ph.D., ABPP-CN, board certified clinical neuropsychologist. Ms. Lundy completed a clinical interview with Dr. Maryland, billed as one unit [phone], and 135 minutes of cognitive testing and scoring, billed as one unit [phone] and three additional units [id number]. Psychometrist Lonell Jude, B.S. assisted Dr. Maryland with test administration and scoring procedures. As a separate and discrete service, one unit [id number] and two units 96133 (168 minutes) were billed for Dr. Loralee time spent in interpretation and report writing.

## 2024-02-22 NOTE — Telephone Encounter (Unsigned)
 Copied from CRM #[id number]. Topic: Clinical - Medication Refill >> Feb 22, 2024  3:46 PM Jasmin G wrote: Medication: clonazePAM  (KLONOPIN ) 0.5 MG tablet  Has the patient contacted their pharmacy? No (Agent: If no, request that the patient contact the pharmacy for the refill. If patient does not wish to contact the pharmacy document the reason why and proceed with request.) (Agent: If yes, when and what did the pharmacy advise?)  This is the patient's preferred pharmacy:  [facility] [address] [place] - [address]  [place] [id number] Phone: [phone] Fax: [phone]  Is this the correct pharmacy for this prescription? Yes If no, delete pharmacy and type the correct one.   Has the prescription been filled recently? Yes  Is the patient out of the medication? Yes  Has the patient been seen for an appointment in the last year OR does the patient have an upcoming appointment? No  Can we respond through MyChart? No  Agent: Please be advised that Rx refills may take up to 3 business days. We ask that you follow-up with your pharmacy.

## 2024-02-23 ENCOUNTER — Encounter: Payer: Self-pay | Admitting: Psychology

## 2024-02-23 MED ORDER — CLONAZEPAM 0.5 MG PO TABS: ORAL_TABLET | ORAL | 1 refills | Status: AC

## 2024-03-10 ENCOUNTER — Ambulatory Visit: Payer: Self-pay | Admitting: Psychology

## 2024-03-10 DIAGNOSIS — F0280 Dementia in other diseases classified elsewhere without behavioral disturbance: Secondary | ICD-10-CM | POA: Diagnosis not present

## 2024-03-10 DIAGNOSIS — F0150 Vascular dementia without behavioral disturbance: Secondary | ICD-10-CM

## 2024-03-10 DIAGNOSIS — G309 Alzheimer's disease, unspecified: Secondary | ICD-10-CM

## 2024-03-10 NOTE — Progress Notes (Signed)
"  ° °  Neuropsychology Feedback Session Jacqueline DEL. [facility] Department of Neurology  Reason for Referral:   Jacqueline [facility] is a 84 y.o. right-handed Caucasian female referred by Camie Sevin, PA-C, to characterize her current cognitive functioning and assist with diagnostic clarity and treatment planning in the context of subjective cognitive decline.   Feedback:   Jacqueline [facility] completed a comprehensive neuropsychological evaluation on 02/22/2024. Please refer to that encounter for the full report and recommendations. Briefly, results suggested severe impairment surrounding both delayed retrieval and recognition/consolidation aspects of memory. Further weakness/variability was exhibited across processing speed, executive functioning, semantic fluency, confrontation naming, and encoding (i.e., learning) aspects of memory. 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 Across memory testing, Jacqueline [facility] was fully amnestic (i.e., 0% retention) after brief delays and performed very poorly across follow-up recognition trials. 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 Further weakness surrounding semantic fluency, confrontation naming, and executive functioning could represent a fairly typical pattern of disease progression. Unfortunately, current memory performances are above and beyond what would be expected for the typical aging process. While hearing loss may play a role in her clinical presentation, it cannot explain current memory patterns. 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 While the location of this stroke could indeed impact memory abilities, it's right-sided location is not a sufficient explanation for severe verbal memory impairment assuming normal brain  lateralization of this right-handed individual. 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 Continued medical monitoring will be important moving forward.  Jacqueline Burgess was accompanied by her son during the current feedback session. Content of the current session focused on the results of her neuropsychological evaluation. Jacqueline [facility] was given the opportunity to ask questions and her questions were answered. She was encouraged to reach out should additional questions arise. A copy of her report was provided at the conclusion of the visit.      One unit 96132 (35 minutes) was billed for Dr. Loralee time spent preparing for, conducting, and documenting the current feedback session with Jacqueline Burgess. "

## 2024-05-01 ENCOUNTER — Ambulatory Visit: Payer: Self-pay | Admitting: Physician Assistant
